# Patient Record
Sex: Male | Born: 1960 | Race: Black or African American | Hispanic: No | Marital: Single | Smoking: Current every day smoker
Health system: Southern US, Community
[De-identification: ages and names within clinical notes are randomized; demographics above are authoritative.]

## PROBLEM LIST (undated history)

## (undated) ENCOUNTER — Emergency Department: Admission: EM | Disposition: A | Discharge status: Other Acute Inpatient Hospital | Payer: Self-pay

## (undated) DIAGNOSIS — K649 Unspecified hemorrhoids: Secondary | ICD-10-CM

## (undated) DIAGNOSIS — E785 Hyperlipidemia, unspecified: Secondary | ICD-10-CM

## (undated) DIAGNOSIS — I10 Essential (primary) hypertension: Secondary | ICD-10-CM

## (undated) DIAGNOSIS — N289 Disorder of kidney and ureter, unspecified: Secondary | ICD-10-CM

## (undated) DIAGNOSIS — M1990 Unspecified osteoarthritis, unspecified site: Secondary | ICD-10-CM

## (undated) HISTORY — DX: Unspecified osteoarthritis, unspecified site: M19.90

## (undated) HISTORY — PX: EYE SURGERY: SHX253

## (undated) HISTORY — DX: Unspecified hemorrhoids: K64.9

## (undated) HISTORY — DX: Hyperlipidemia, unspecified: E78.5

## (undated) HISTORY — PX: TOTAL SHOULDER REPLACEMENT: SUR1217

---

## 2005-03-05 ENCOUNTER — Emergency Department: Payer: Self-pay | Admitting: Emergency Medicine

## 2005-03-05 IMAGING — CR DG SHOULDER 3+V*L*
1 series · 3 of 3 positions shown · non-contrast
Comparison: none

REASON FOR EXAM: Possible dislocated shoulder
COMMENTS:

PROCEDURE:     DXR - DXR SHOULDER LEFT COMPLETE  - March 05, 2005  [DATE]
RESULT:     The patient sustained inferior dislocation of the shoulder.
Some fragmentation of the greater tuberosity is suspected.

[Series 1: view not recorded · 0.17mm/px · 3 of 3 slices shown]
[im 1/3]
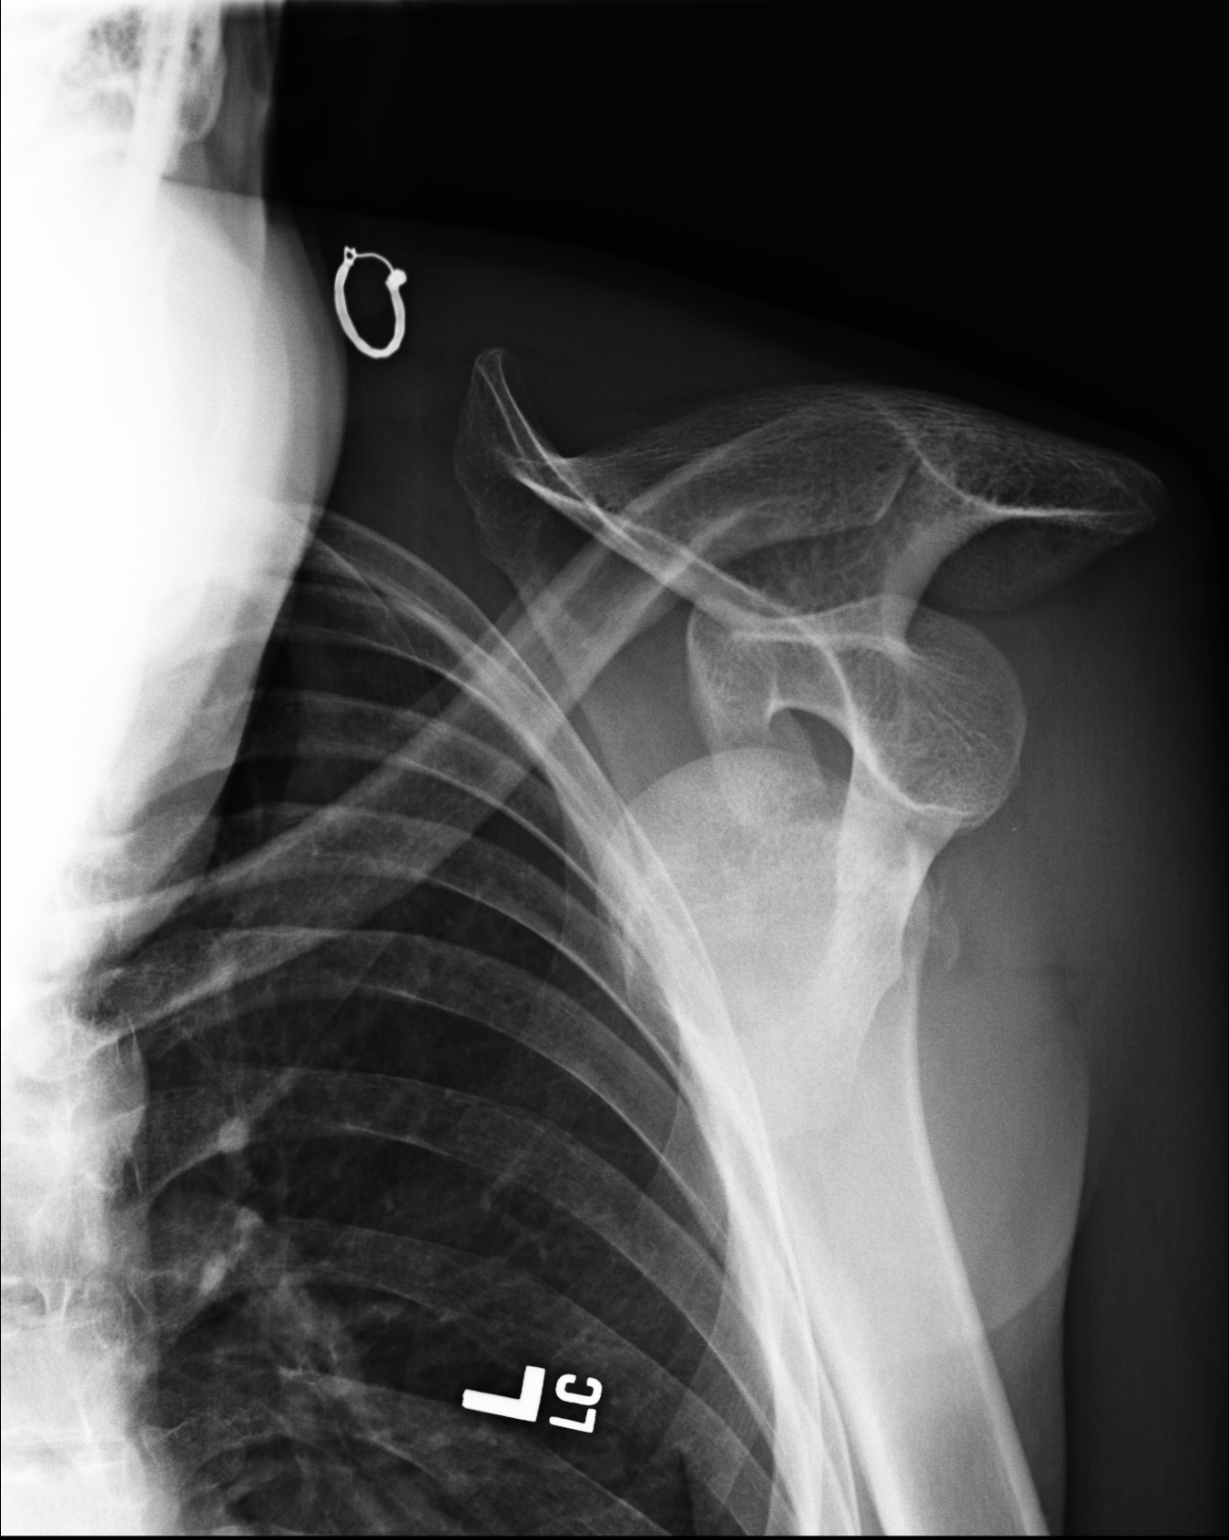
[im 2/3]
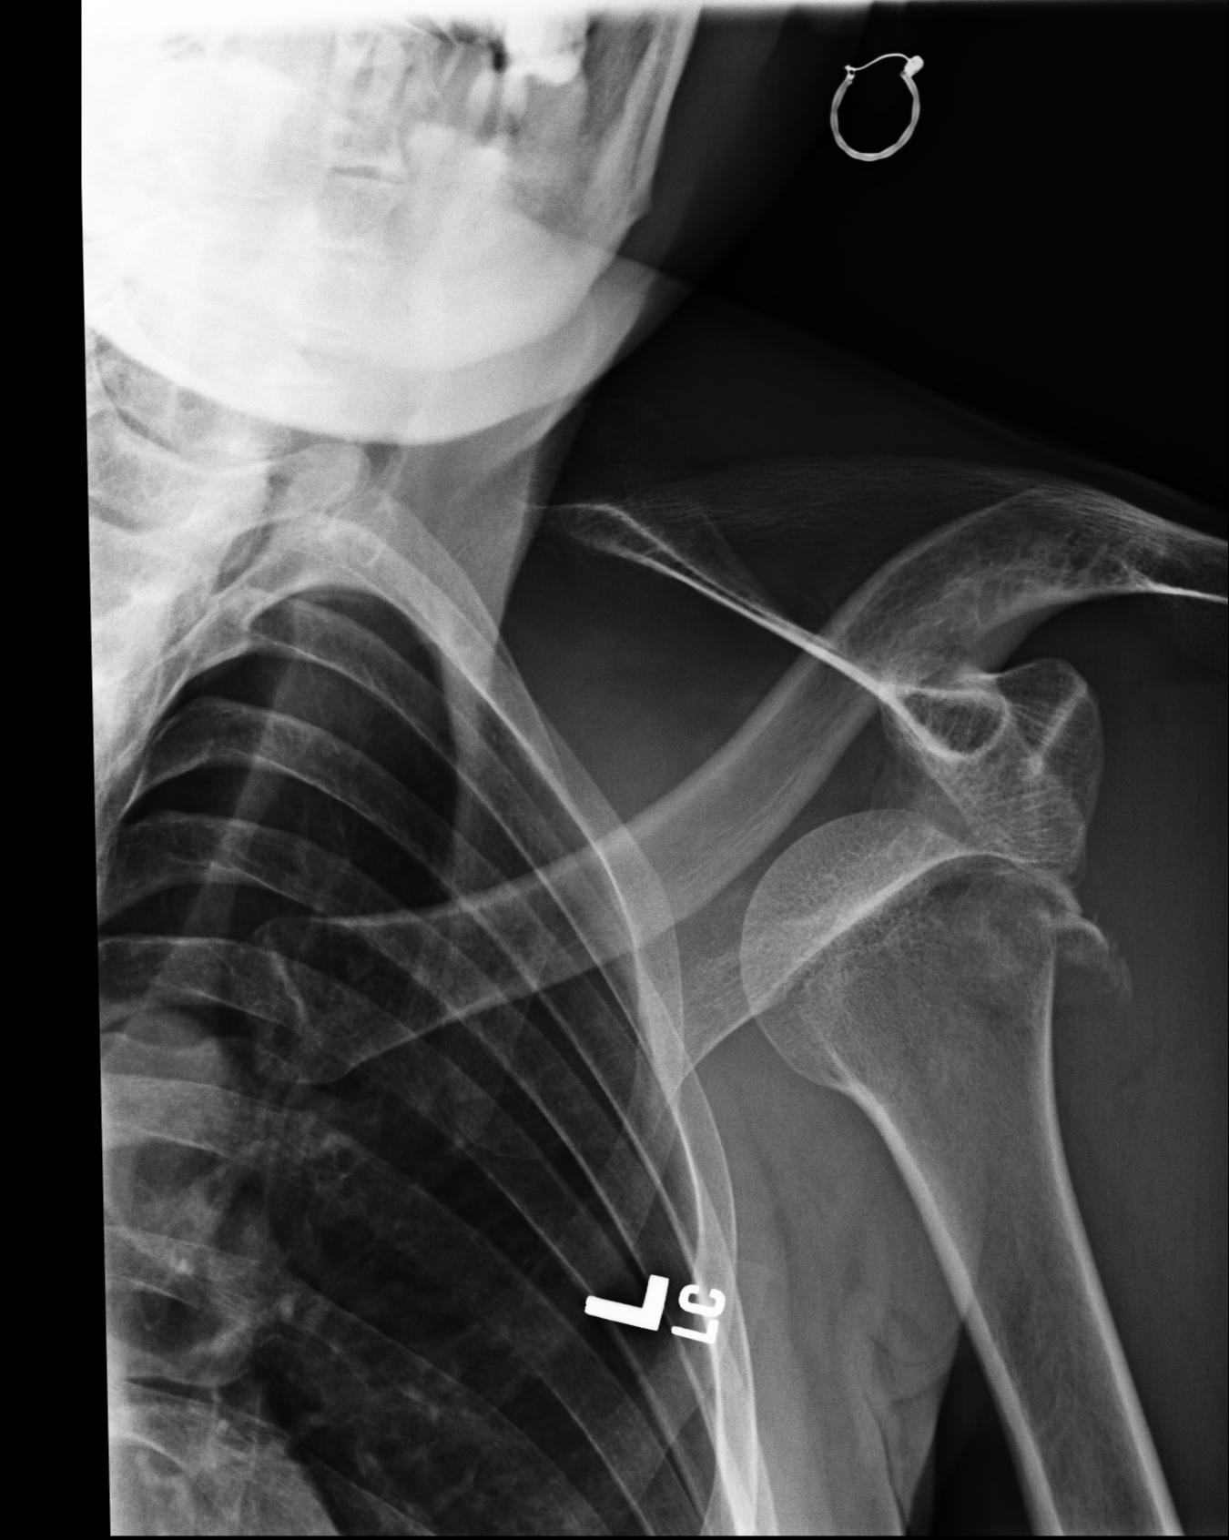
[im 3/3]
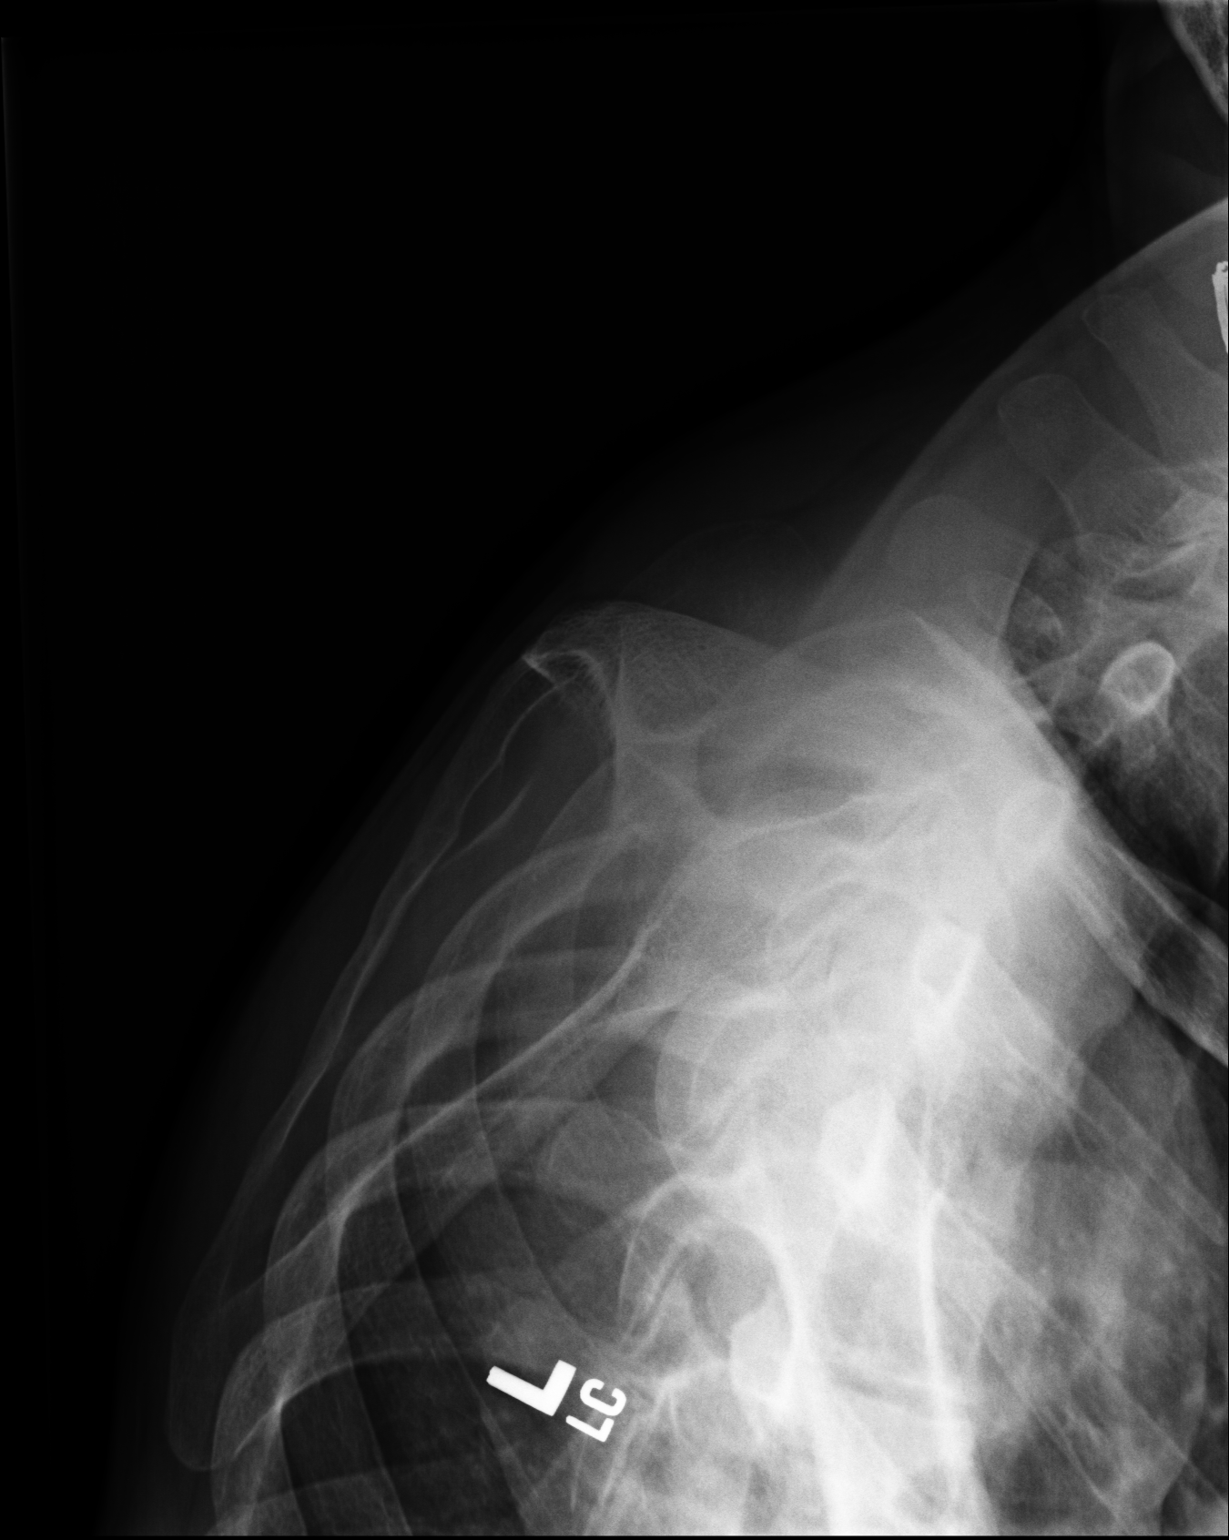

[3 of 3 positions shown; findings below may reference images not displayed]

IMPRESSION: The patient has sustained an inferior and presumably anterior dislocation of
the humeral head and there does appear to be some avulsion from the greater
tuberosity.

## 2005-03-05 IMAGING — CR DG SHOULDER 3+V*L*
1 series · 2 of 2 positions shown · non-contrast
Comparison: none

REASON FOR EXAM: Post reduction
COMMENTS:

[Series 1: view not recorded · 0.17mm/px · 2 of 2 slices shown]
[im 1/2]
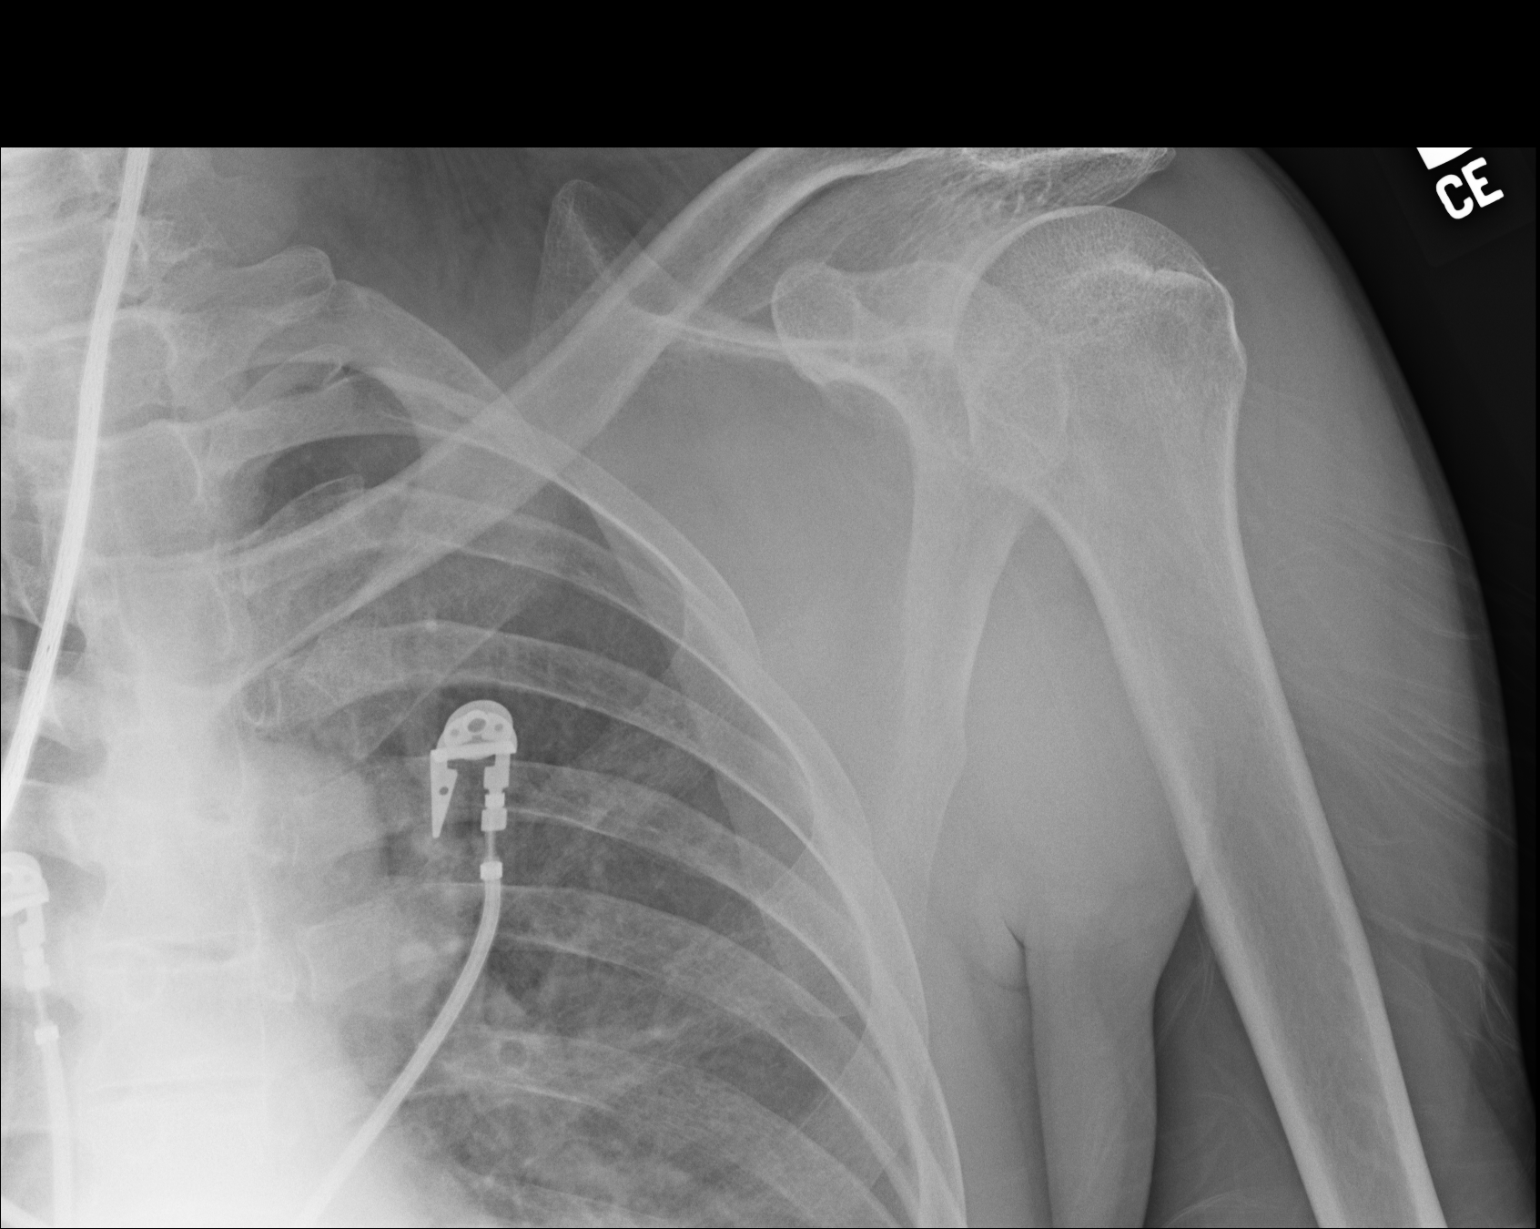
[im 2/2]
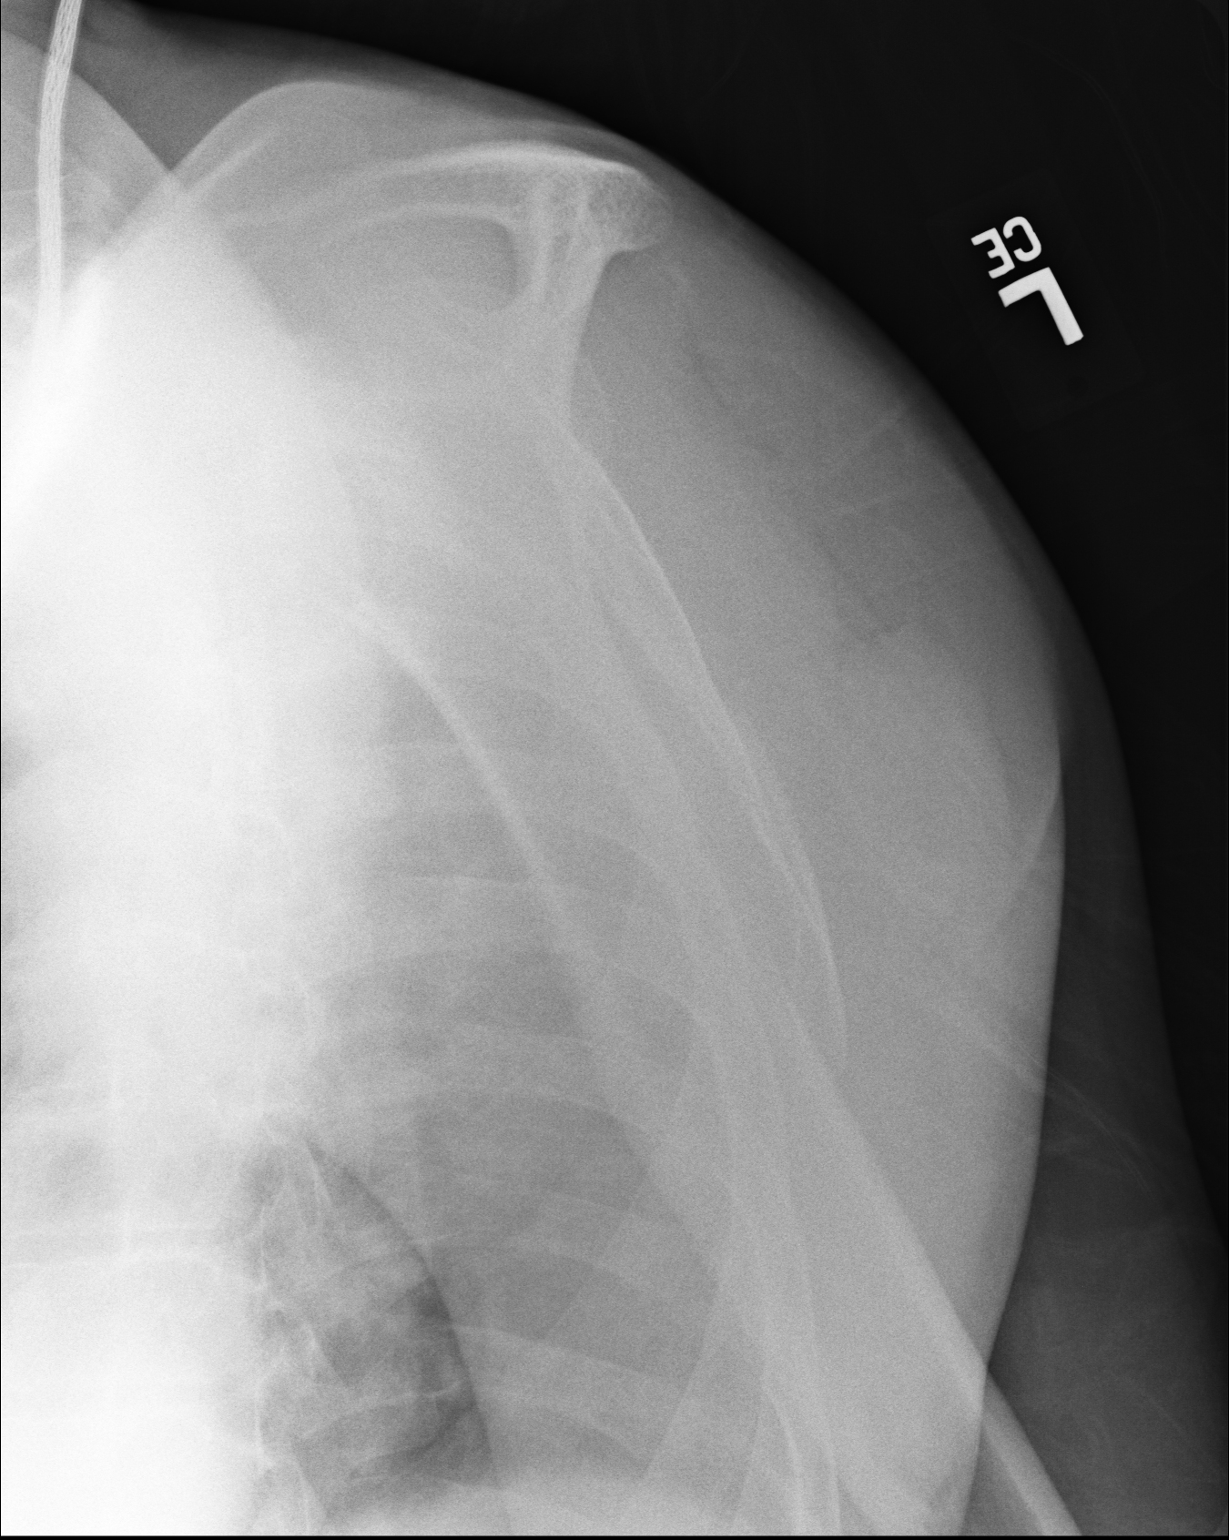

[2 of 2 positions shown; findings below may reference images not displayed]

PROCEDURE:     DXR - DXR SHOULDER LEFT COMPLETE  - March 05, 2005  [DATE]

RESULT:          The patient has undergone successful relocation of the
shoulder.  The fragmentation seen in the region of the greater tuberosity is
not visible now and may be overlapping the bony structures.  Please refer to
the pre-reduction films for additional details of the fragmentation.
IMPRESSION: The patient has undergone successful relocation of the
previously dislocated LEFT humeral head.  On the pre-reduction films, there
were findings that suggested avulsion from the greater tuberosity.  CT
scanning of the shoulder may be indicated.

## 2009-06-12 DIAGNOSIS — R809 Proteinuria, unspecified: Secondary | ICD-10-CM | POA: Insufficient documentation

## 2010-08-19 DIAGNOSIS — I10 Essential (primary) hypertension: Secondary | ICD-10-CM | POA: Insufficient documentation

## 2011-03-30 ENCOUNTER — Emergency Department: Payer: Self-pay | Admitting: *Deleted

## 2011-03-30 LAB — COMPREHENSIVE METABOLIC PANEL
Alkaline Phosphatase: 51 U/L (ref 50–136)
Bilirubin,Total: 0.5 mg/dL (ref 0.2–1.0)
Calcium, Total: 9.5 mg/dL (ref 8.5–10.1)
Chloride: 106 mmol/L (ref 98–107)
Co2: 26 mmol/L (ref 21–32)
Creatinine: 1.02 mg/dL (ref 0.60–1.30)
EGFR (African American): >60
EGFR (Non-African Amer.): >60
Osmolality: 282 (ref 275–301)
SGOT(AST): 54 U/L — ABNORMAL HIGH (ref 15–37)
Sodium: 142 mmol/L (ref 136–145)

## 2011-03-30 LAB — CK TOTAL AND CKMB (NOT AT ARMC)
CK, Total: 404 U/L — ABNORMAL HIGH (ref 35–232)
CK-MB: 0.6 ng/mL (ref 0.5–3.6)

## 2011-03-30 LAB — CBC
HCT: 41.8 % (ref 40.0–52.0)
MCHC: 33.1 g/dL (ref 32.0–36.0)
Platelet: 159 10*3/uL (ref 150–440)
RBC: 4.32 10*6/uL — ABNORMAL LOW (ref 4.40–5.90)
RDW: 14.8 % — ABNORMAL HIGH (ref 11.5–14.5)

## 2011-03-30 LAB — TROPONIN I: Troponin-I: <0.02 ng/mL

## 2011-03-30 IMAGING — CR DG CHEST 1V PORT
1 series · 1 of 1 positions shown · non-contrast
Comparison: none

REASON FOR EXAM: pain
COMMENTS:

PROCEDURE:     DXR - DXR PORTABLE CHEST SINGLE VIEW  - March 30, 2011  [DATE]
RESULT:     Comparison: None

[ap]
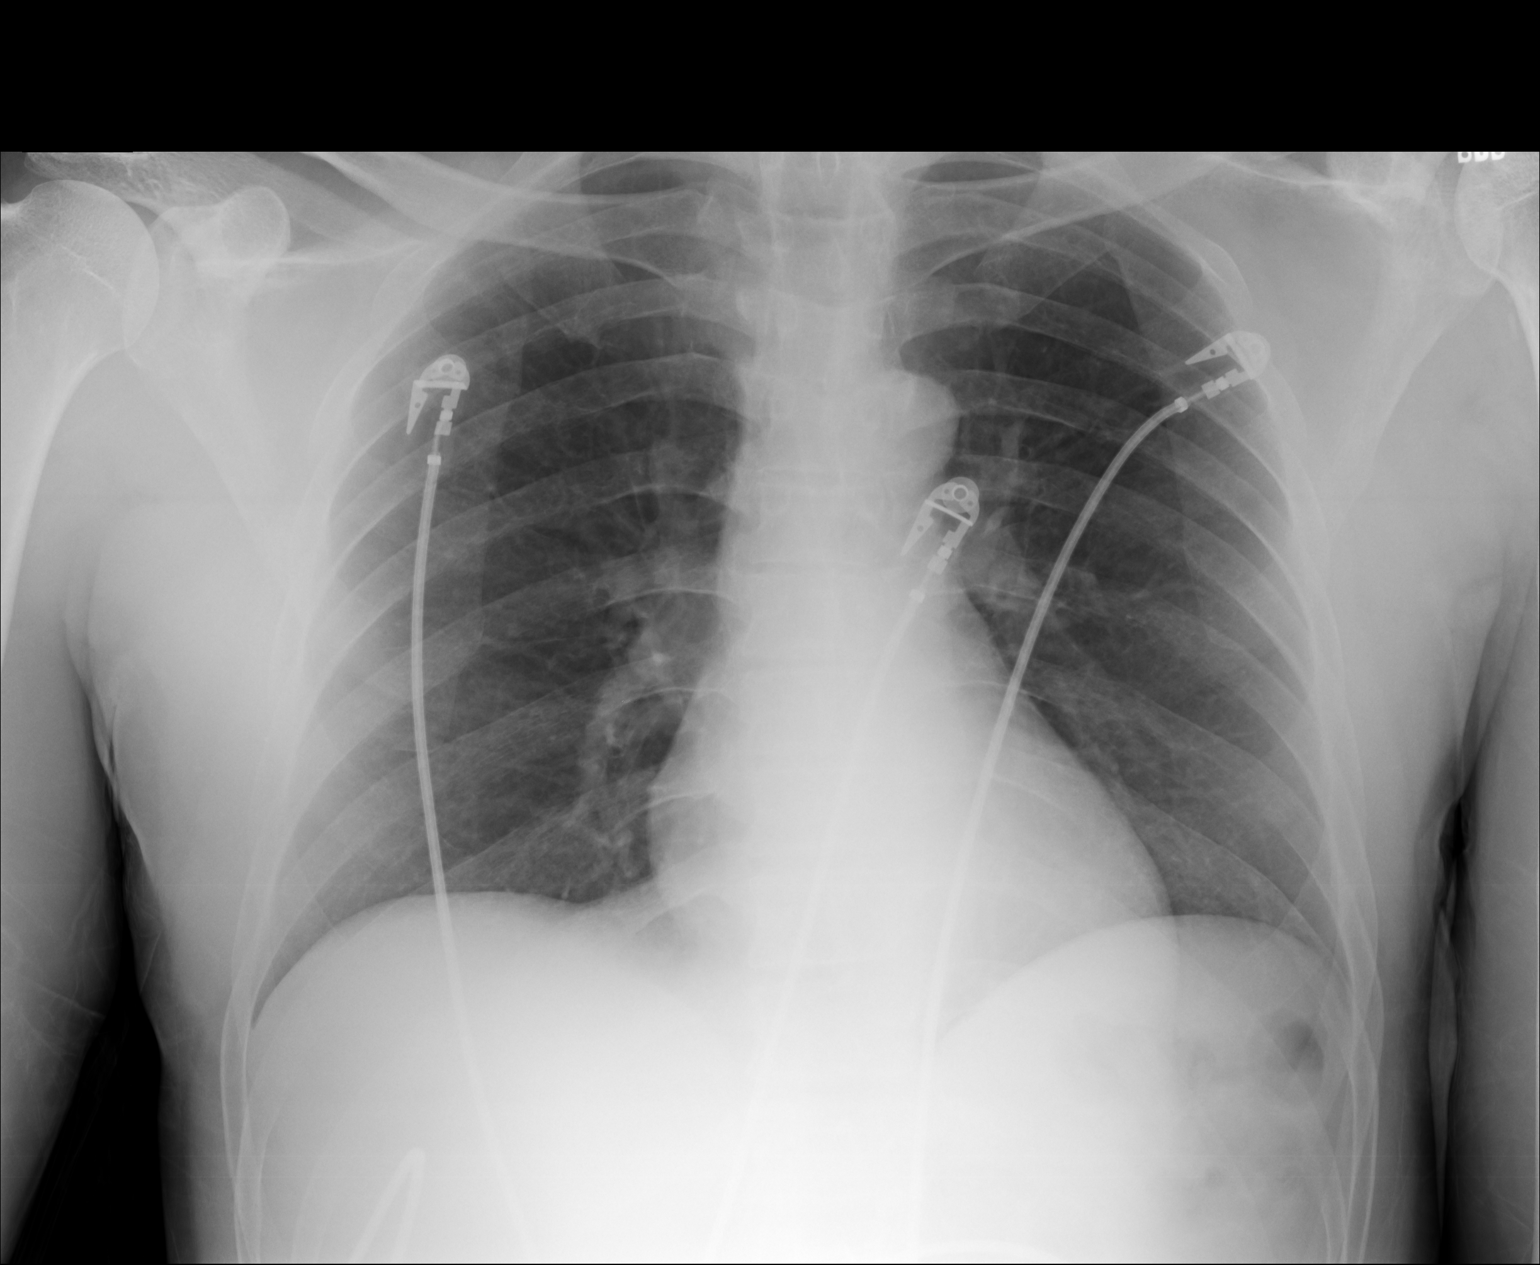

[1 of 1 positions shown; findings below may reference images not displayed]

FINDINGS: Single portable AP chest radiograph is provided.  There is no focal
parenchymal opacity, pleural effusion, or pneumothorax. Normal
cardiomediastinal silhouette. The osseous structures are unremarkable.
IMPRESSION: No acute disease of the chest.

## 2012-04-18 DIAGNOSIS — E7800 Pure hypercholesterolemia, unspecified: Secondary | ICD-10-CM | POA: Insufficient documentation

## 2012-08-02 DIAGNOSIS — R7401 Elevation of levels of liver transaminase levels: Secondary | ICD-10-CM | POA: Insufficient documentation

## 2013-07-19 ENCOUNTER — Emergency Department: Payer: Self-pay | Admitting: Emergency Medicine
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
## 2013-07-26 DIAGNOSIS — S63509A Unspecified sprain of unspecified wrist, initial encounter: Secondary | ICD-10-CM | POA: Insufficient documentation

## 2017-05-07 ENCOUNTER — Other Ambulatory Visit: Payer: Self-pay | Admitting: Family Medicine

## 2017-05-07 ENCOUNTER — Ambulatory Visit
Admission: RE | Admit: 2017-05-07 | Discharge: 2017-05-07 | Disposition: A | Discharge status: Home / Self Care | Payer: Disability Insurance | Source: Ambulatory Visit | Attending: Family Medicine | Admitting: Family Medicine

## 2017-05-07 DIAGNOSIS — M47816 Spondylosis without myelopathy or radiculopathy, lumbar region: Secondary | ICD-10-CM | POA: Diagnosis not present

## 2017-05-07 DIAGNOSIS — R2681 Unsteadiness on feet: Secondary | ICD-10-CM

## 2017-05-07 IMAGING — CR DG LUMBAR SPINE 2-3V
1 series · 3 of 3 positions shown · non-contrast
Comparison: None.

CLINICAL DATA: 57 y/o M; lower back pain for 2 years. No prior
injury.

EXAM:
LUMBAR SPINE - 2-3 VIEW

[Series 1: dg lumbar spine 2-3 views · 0.14mm/px · 3 of 3 slices shown]
[im 1/3]
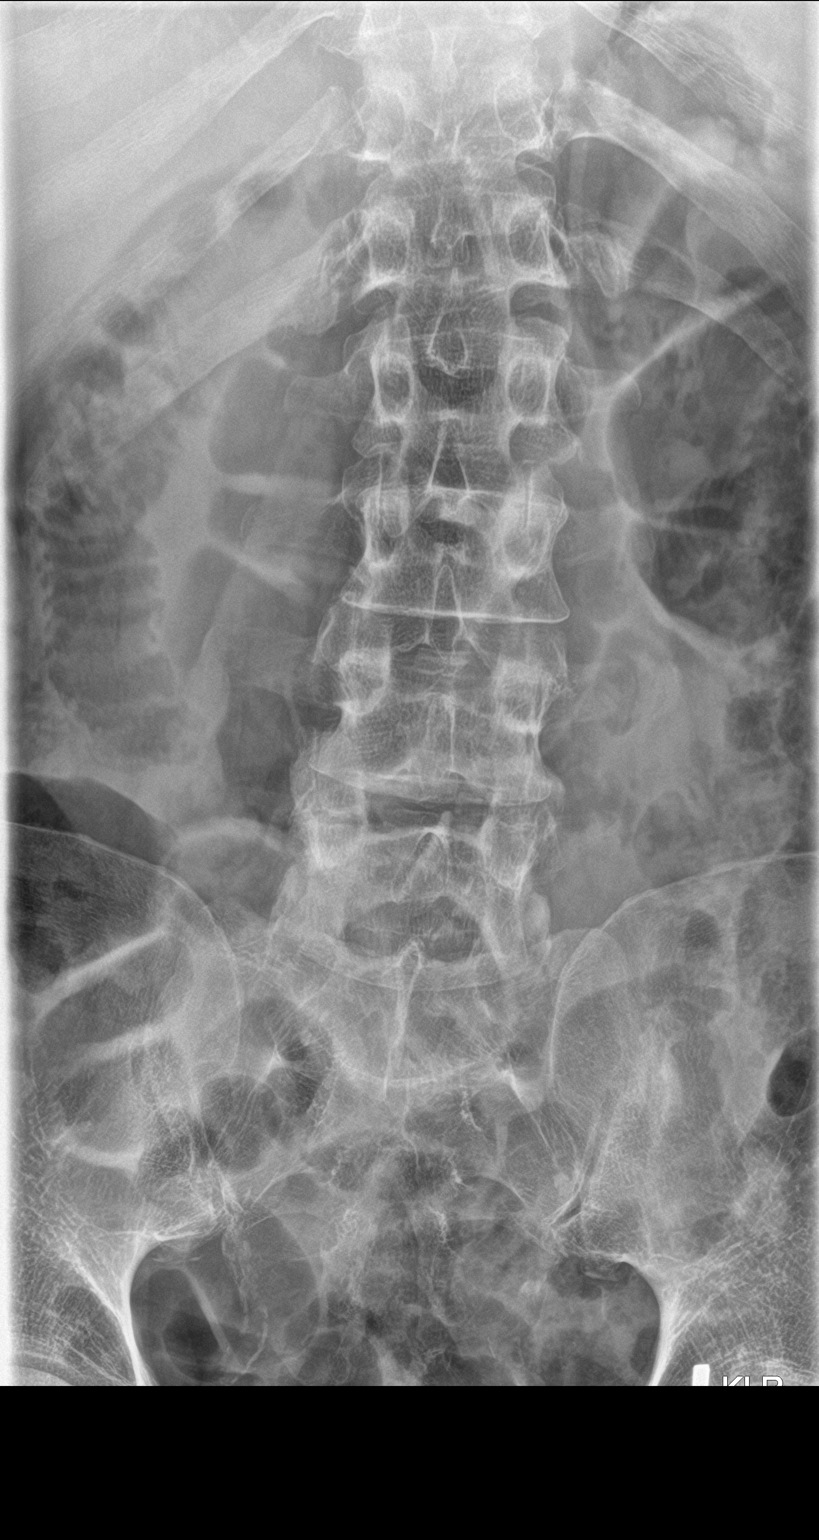
[im 2/3]
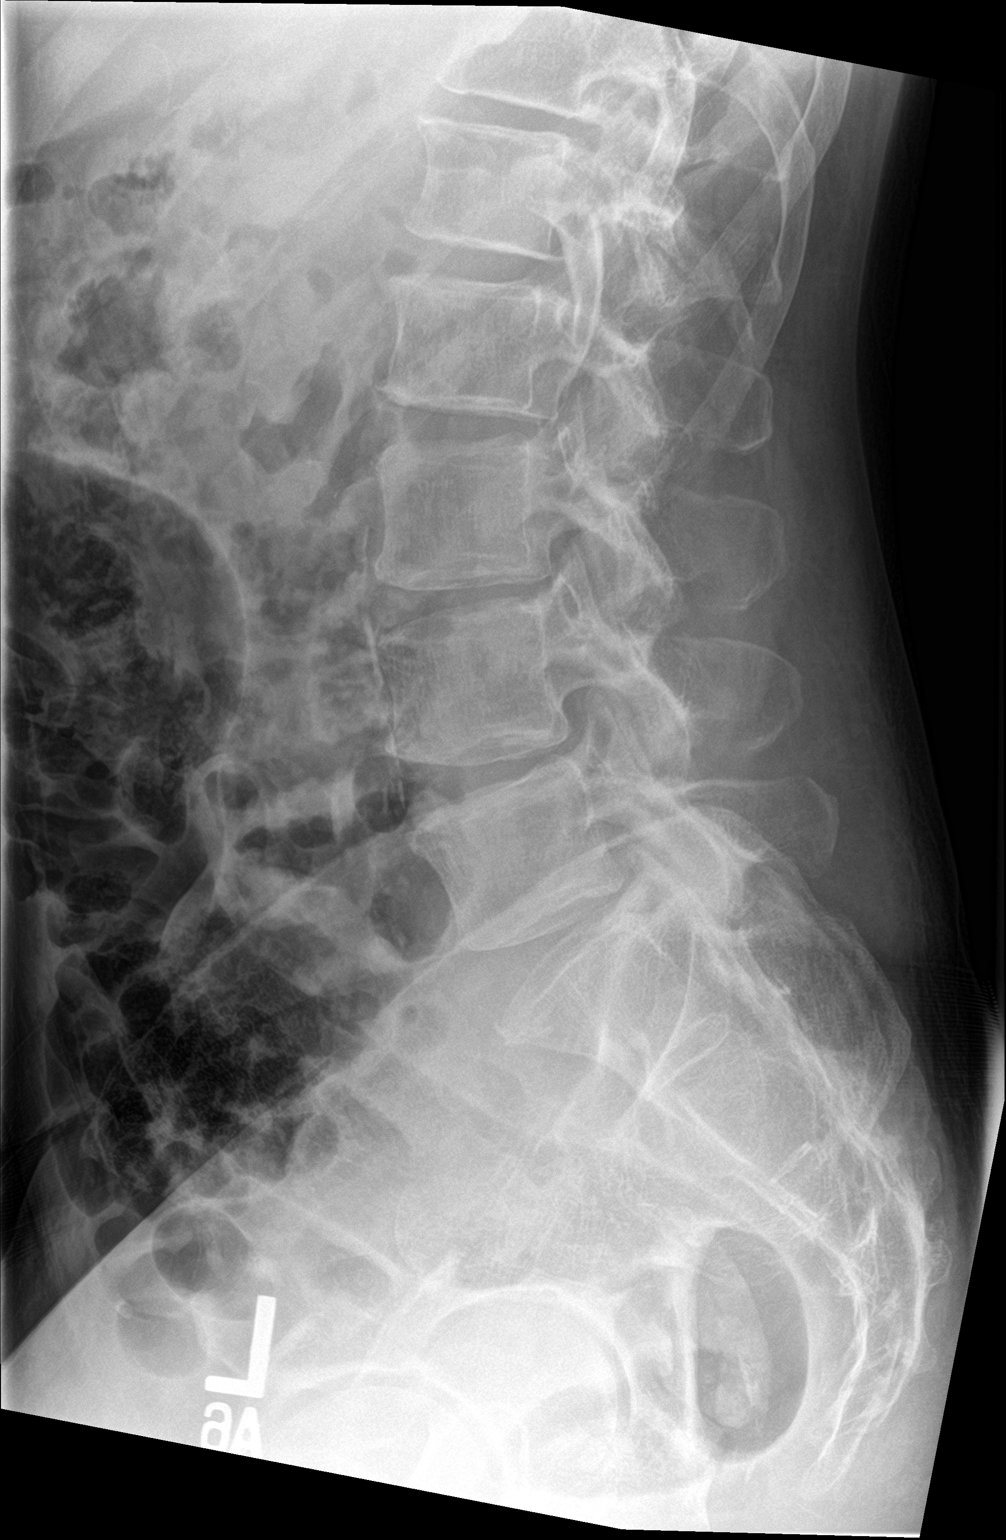
[im 3/3]
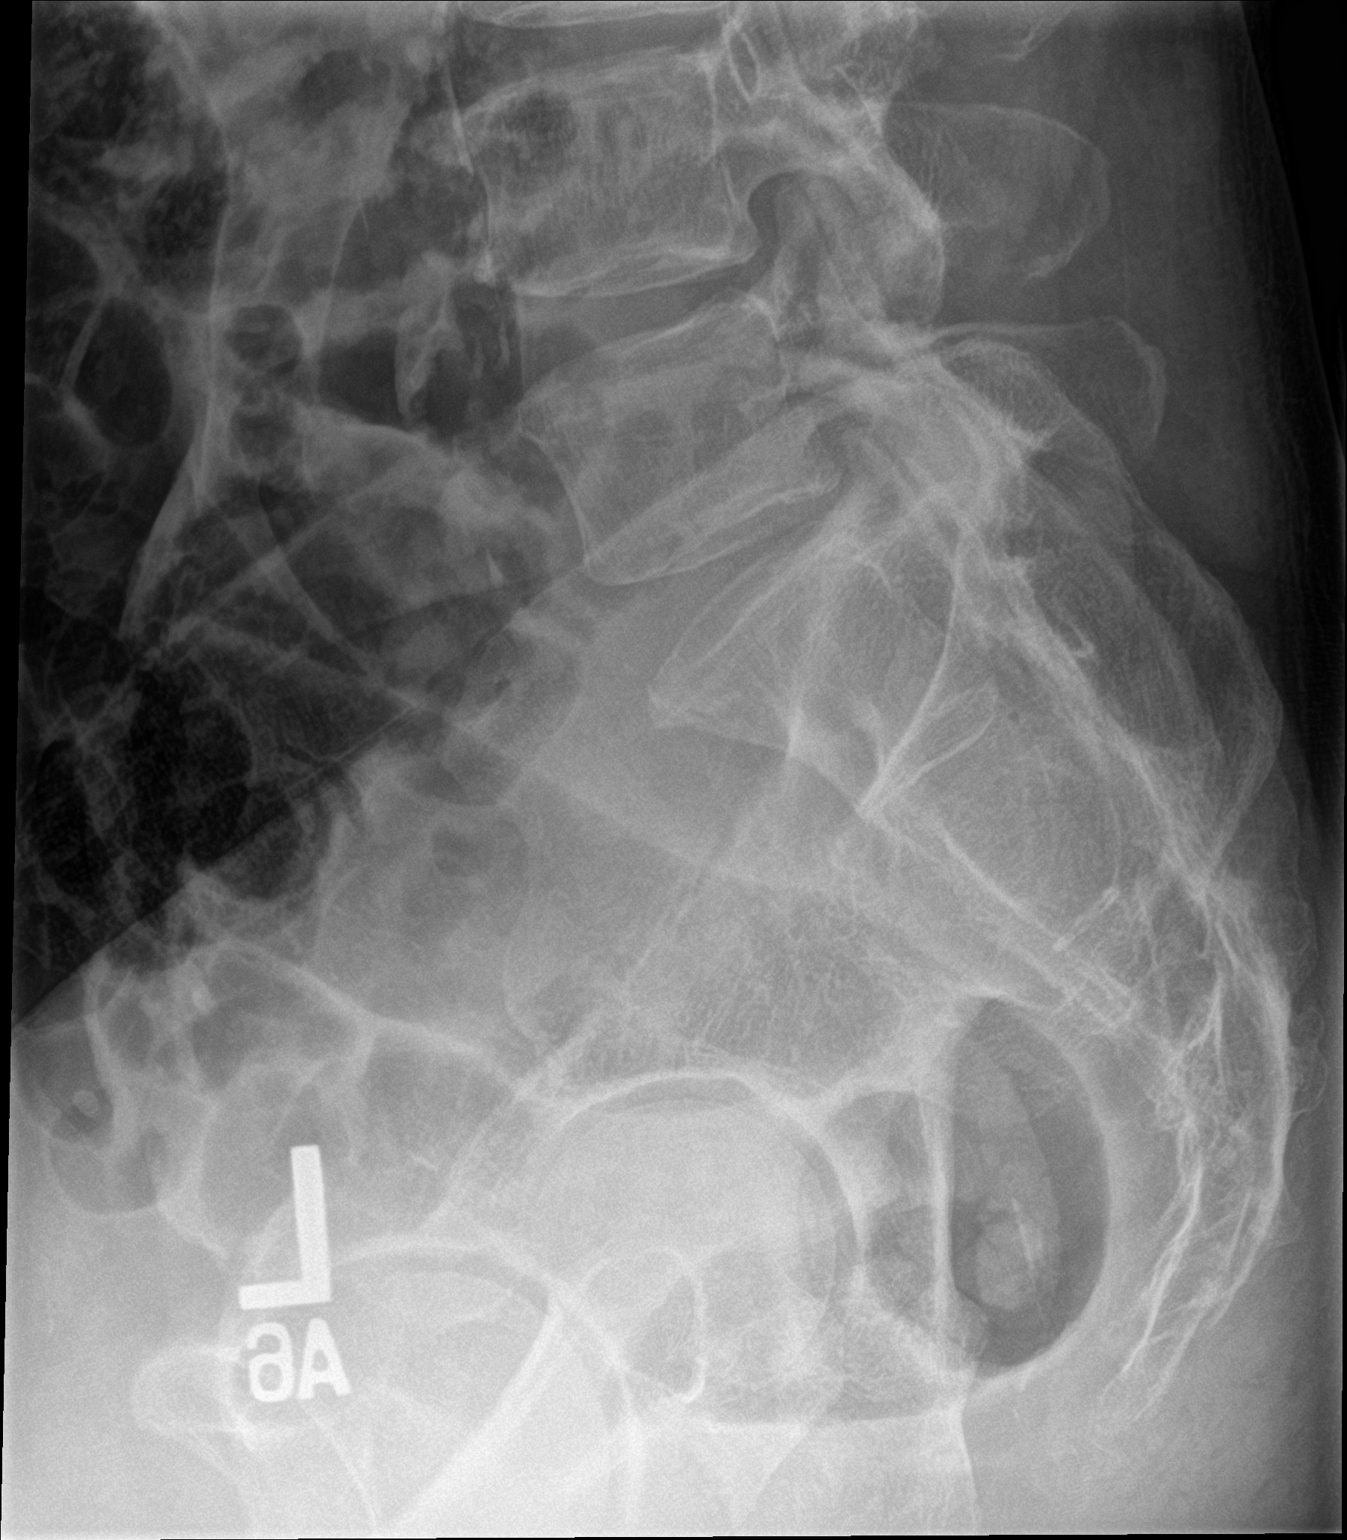

[3 of 3 positions shown; findings below may reference images not displayed]

FINDINGS: Four lumbar type non-rib-bearing vertebral bodies. Mild
levocurvature with apex at L1. Normal lumbar lordosis without
listhesis. Vertebral body and disc space heights are maintained.
Mild lower lumbar facet arthrosis. Abdominal aortic calcific
atherosclerosis.
IMPRESSION: Mild lumbar spondylosis with lower lumbar facet arthrosis and
levocurvature. No significant loss of vertebral body or disc space
height.

By: Zilton Adilson M.D.

## 2018-01-07 ENCOUNTER — Emergency Department
Admission: EM | Admit: 2018-01-07 | Discharge: 2018-01-07 | Disposition: A | Discharge status: Home / Self Care | Payer: Medicaid Other | Attending: Emergency Medicine | Admitting: Emergency Medicine

## 2018-01-07 ENCOUNTER — Encounter: Payer: Self-pay | Admitting: Emergency Medicine

## 2018-01-07 ENCOUNTER — Other Ambulatory Visit: Payer: Self-pay

## 2018-01-07 ENCOUNTER — Emergency Department: Payer: Medicaid Other

## 2018-01-07 DIAGNOSIS — R1030 Lower abdominal pain, unspecified: Secondary | ICD-10-CM | POA: Diagnosis present

## 2018-01-07 DIAGNOSIS — I10 Essential (primary) hypertension: Secondary | ICD-10-CM | POA: Diagnosis not present

## 2018-01-07 DIAGNOSIS — F17210 Nicotine dependence, cigarettes, uncomplicated: Secondary | ICD-10-CM | POA: Diagnosis not present

## 2018-01-07 DIAGNOSIS — K852 Alcohol induced acute pancreatitis without necrosis or infection: Secondary | ICD-10-CM | POA: Diagnosis not present

## 2018-01-07 HISTORY — DX: Essential (primary) hypertension: I10

## 2018-01-07 HISTORY — DX: Disorder of kidney and ureter, unspecified: N28.9

## 2018-01-07 LAB — URINALYSIS, COMPLETE (UACMP) WITH MICROSCOPIC
Bacteria, UA: NONE SEEN
Bilirubin Urine: NEGATIVE
GLUCOSE, UA: NEGATIVE mg/dL
Hgb urine dipstick: NEGATIVE
Ketones, ur: 20 mg/dL — AB
Leukocytes, UA: NEGATIVE
Nitrite: NEGATIVE
PH: 7 (ref 5.0–8.0)
Protein, ur: >=300 mg/dL — AB
SPECIFIC GRAVITY, URINE: 1.026 (ref 1.005–1.030)

## 2018-01-07 LAB — CBC
HCT: 41.2 % (ref 39.0–52.0)
Hemoglobin: 14.1 g/dL (ref 13.0–17.0)
MCH: 32.1 pg (ref 26.0–34.0)
MCHC: 34.2 g/dL (ref 30.0–36.0)
MCV: 93.8 fL (ref 80.0–100.0)
NRBC: 0 % (ref 0.0–0.2)
PLATELETS: 146 10*3/uL — AB (ref 150–400)
RBC: 4.39 MIL/uL (ref 4.22–5.81)
RDW: 14.4 % (ref 11.5–15.5)
WBC: 8.5 10*3/uL (ref 4.0–10.5)

## 2018-01-07 LAB — COMPREHENSIVE METABOLIC PANEL
ALT: 78 U/L — ABNORMAL HIGH (ref 0–44)
AST: 84 U/L — ABNORMAL HIGH (ref 15–41)
Albumin: 3.8 g/dL (ref 3.5–5.0)
Alkaline Phosphatase: 98 U/L (ref 38–126)
Anion gap: 11 (ref 5–15)
BILIRUBIN TOTAL: 1 mg/dL (ref 0.3–1.2)
BUN: 7 mg/dL (ref 6–20)
CO2: 25 mmol/L (ref 22–32)
Calcium: 9 mg/dL (ref 8.9–10.3)
Chloride: 94 mmol/L — ABNORMAL LOW (ref 98–111)
Creatinine, Ser: 0.91 mg/dL (ref 0.61–1.24)
GFR calc non Af Amer: >60 mL/min (ref >60)
Glucose, Bld: 117 mg/dL — ABNORMAL HIGH (ref 70–99)
Potassium: 3.8 mmol/L (ref 3.5–5.1)
Sodium: 130 mmol/L — ABNORMAL LOW (ref 135–145)
TOTAL PROTEIN: 8.5 g/dL — AB (ref 6.5–8.1)

## 2018-01-07 LAB — LIPASE, BLOOD: Lipase: 559 U/L — ABNORMAL HIGH (ref 11–51)

## 2018-01-07 IMAGING — CT CT ABD-PELV W/ CM
2 of 5 series · 15 of 46 positions shown, 17 images · IV contrast (APPLIED)
Comparison: None.

CLINICAL DATA: 57-year-old male with lower abdominal pain x2 days.
Lower back pain and diarrhea. Elevated lipase and liver enzymes.

EXAM:
CT ABDOMEN AND PELVIS WITH CONTRAST
TECHNIQUE: Multidetector CT imaging of the abdomen and pelvis was performed
using the standard protocol following bolus administration of
intravenous contrast.
CONTRAST:  100mL 783MSP-SII IOPAMIDOL (783MSP-SII) INJECTION 61%

[Series 2: routine abd/pel with · axial · 0.75mm/px · z∈[-472,-38]mm · 12 of 97 slices shown, 14 images]
[im 5/97  soft-tissue]
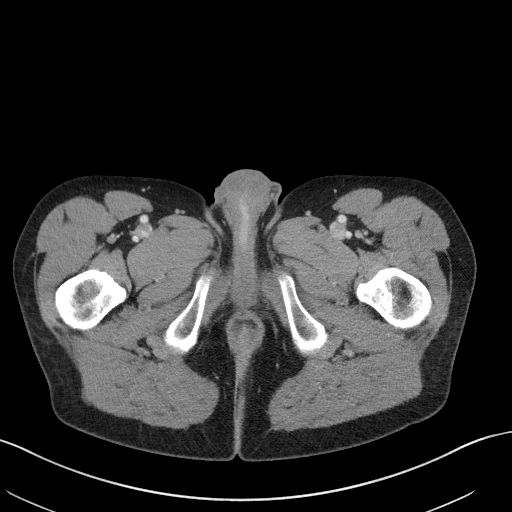
[im 5/97  bone]
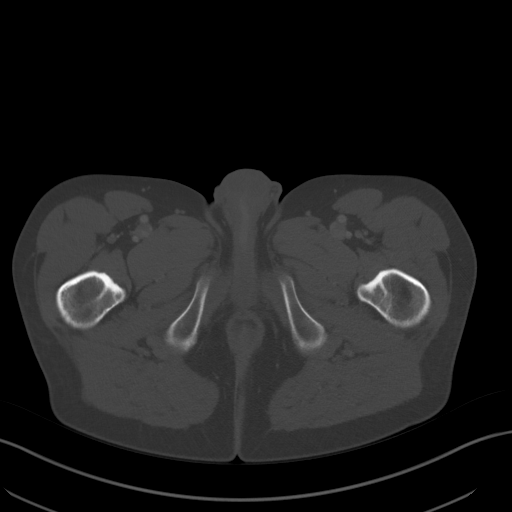
[im 15/97  soft-tissue]
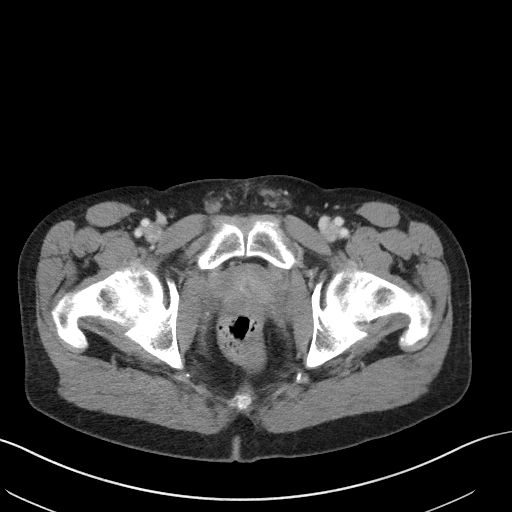
[im 20/97  soft-tissue]
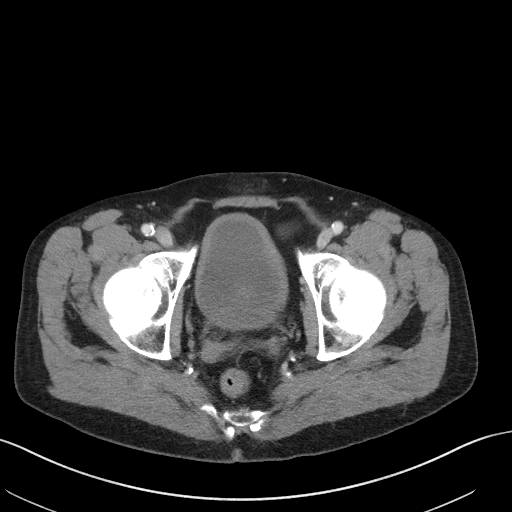
[im 29/97  soft-tissue]
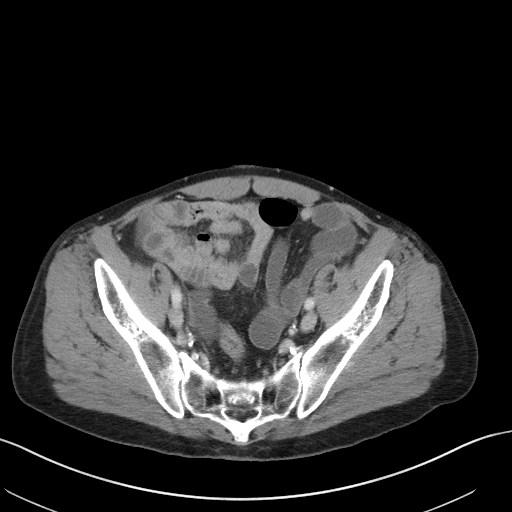
[im 39/97  soft-tissue]
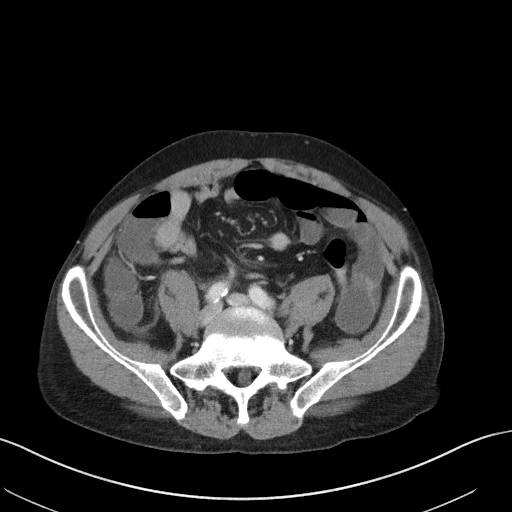
[im 44/97  soft-tissue]
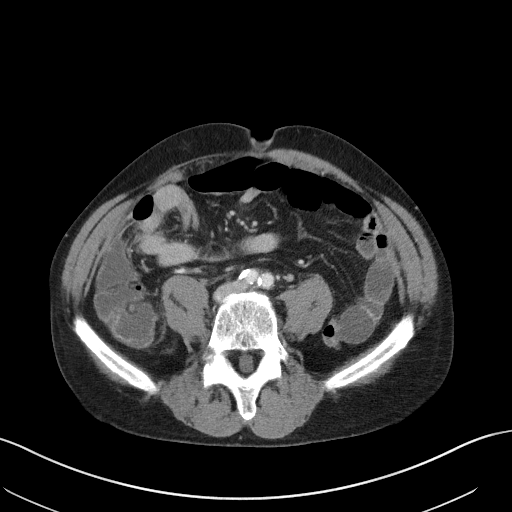
[im 53/97  soft-tissue]
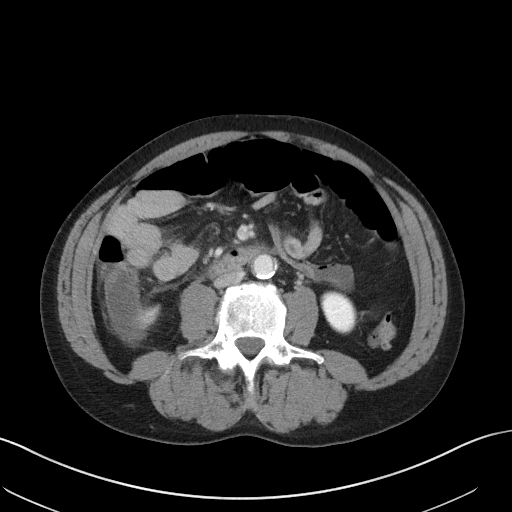
[im 58/97  soft-tissue]
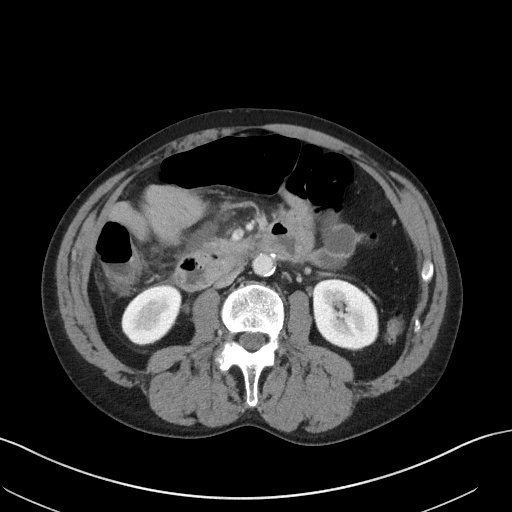
[im 68/97  soft-tissue]
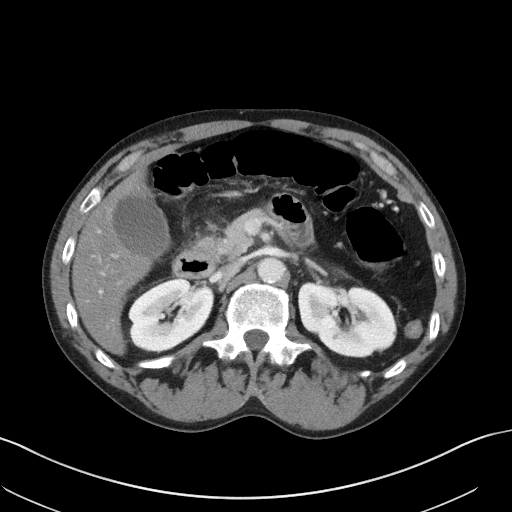
[im 68/97  bone]
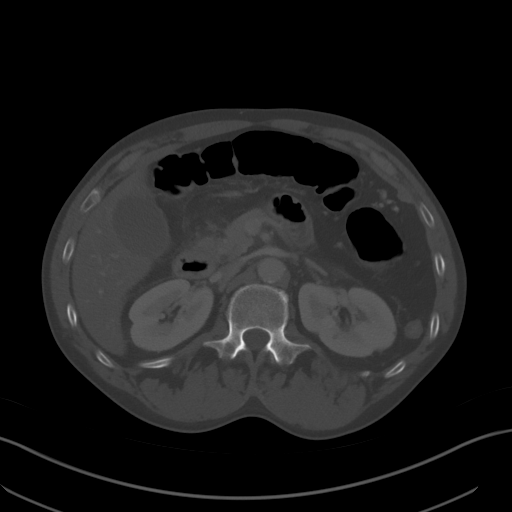
[im 77/97  soft-tissue]
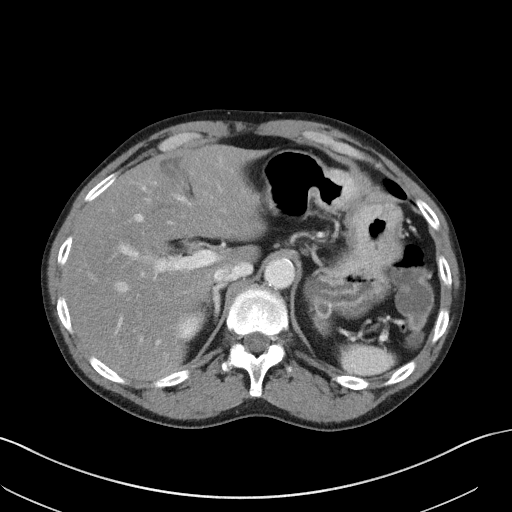
[im 82/97  soft-tissue]
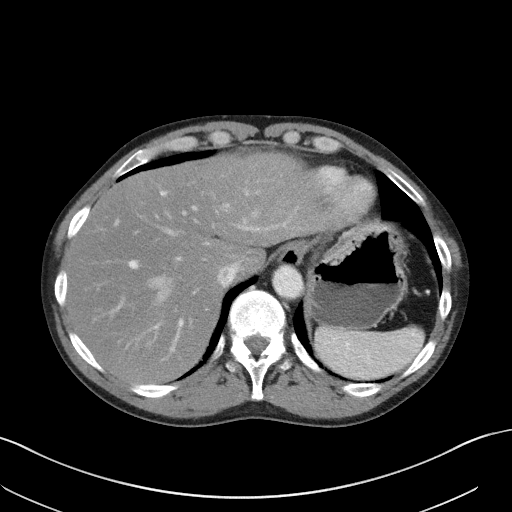
[im 92/97  soft-tissue]
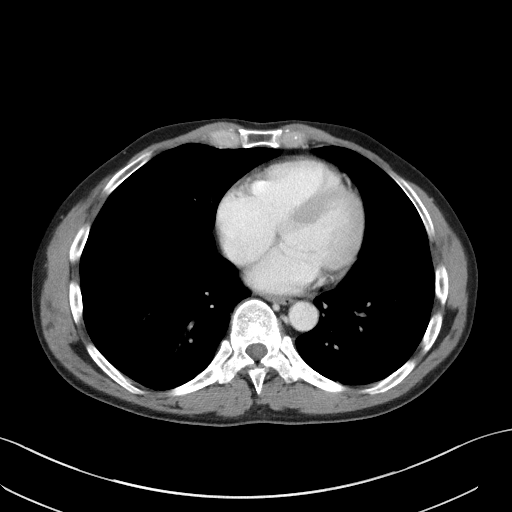

[Series 5: coronal st · coronal · 0.70mm/px · 3 of 85 slices shown]
[im 29/85  soft-tissue]
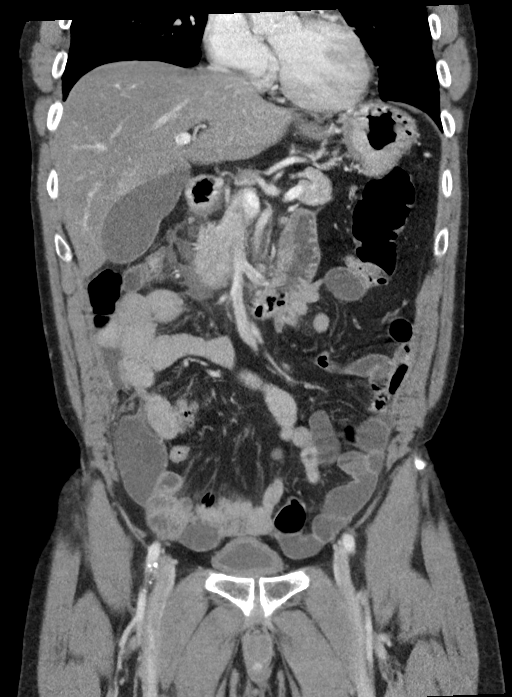
[im 38/85  soft-tissue]
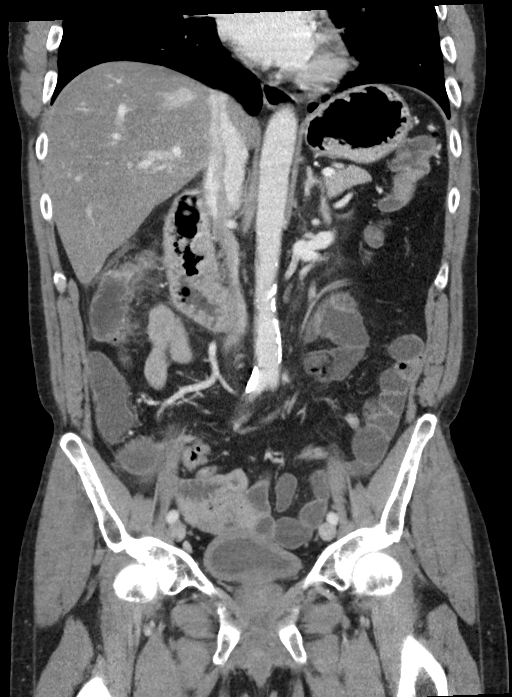
[im 47/85  soft-tissue]
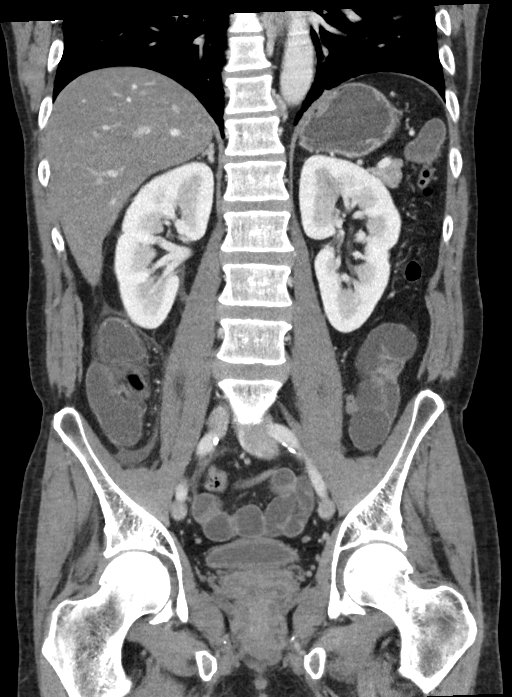

[15 of 46 positions shown; findings below may reference images not displayed]

FINDINGS: Lower chest: Minimal bibasilar atelectatic changes. The visualized
lung bases are otherwise clear.

No intra-abdominal free air. Small amount of free fluid in the right
upper abdomen and adjacent to the head of the pancreas and second
portion of the duodenum.

Hepatobiliary: There is diffuse fatty infiltration of the liver with
areas of fat sparing adjacent to the gallbladder. There is a 16 x 17
mm hypodense area adjacent to the falciform ligament which is not
well characterized but likely a focal area of more prominent fat
deposit. There is no intrahepatic biliary ductal dilatation.3 the
gallbladder is distended0 and appears unremarkable.

Pancreas: There is stranding adjacent to the head and uncinate
process of the pancreas most consistent with acute pancreatitis in
the setting of elevated lipase levels. Clinical correlation is
recommended. No drainable fluid collection/abscess or pseudocyst.

Spleen: Normal in size without focal abnormality.

Adrenals/Urinary Tract: The adrenal glands, kidneys, and the
visualized ureters appear unremarkable. There is apparent diffuse
thickening of the bladder wall which may be partly related to
underdistention. Cystitis is not excluded. Correlation with
urinalysis recommended.

Stomach/Bowel: There is colonic diverticulosis without active
inflammatory changes. The sigmoid colon is redundant.. There is an
area of mucosal enhancement involving the ascending colon at the
hepatic flexure which may be reactive to inflammatory changes of the
pancreas. Colitis is less likely. There is loose stool throughout
the colon compatible with diarrheal state. No evidence of bowel
obstruction. Normal appendix.

Vascular/Lymphatic: Moderate aortoiliac atherosclerotic disease. The
origins of the celiac axis, SMA, IMA are patent. The SMV, splenic
vein, and main portal vein are patent. No portal venous gas. There
is no adenopathy.

Reproductive: The prostate gland is enlarged measuring approximately
6 cm in diameter. The seminal vesicles are symmetric. No pelvic
mass.

Other: Small fat containing umbilical hernia.

Musculoskeletal: No acute or significant osseous findings.
IMPRESSION: 1. Acute pancreatitis.  No abscess or pseudocyst.
2. Diarrheal state. Correlation with clinical exam and stool
cultures recommended.
3. Mucosal enhancement of the hepatic flexure of the colon, likely
reactive to inflammatory changes of the pancreas. No bowel
obstruction. Normal appendix.
4. Fatty liver.
5. Colonic diverticulosis.
6.  Aortic Atherosclerosis ([id number]).

## 2018-01-07 MED ORDER — OXYCODONE-ACETAMINOPHEN 5-325 MG PO TABS
2.0000 | ORAL_TABLET | Freq: Once | ORAL | Status: DC
  Filled 2018-01-07: qty 2

## 2018-01-07 MED ORDER — CHLORDIAZEPOXIDE HCL 25 MG PO CAPS
25.0000 mg | ORAL_CAPSULE | Freq: Once | ORAL | Status: AC
  Administered 2018-01-07: 25 mg via ORAL

## 2018-01-07 MED ORDER — CHLORDIAZEPOXIDE HCL 25 MG PO CAPS
ORAL_CAPSULE | ORAL | Status: AC
  Filled 2018-01-07: qty 1

## 2018-01-07 MED ORDER — OXYCODONE HCL 5 MG PO TABS: 5.0000 mg | ORAL_TABLET | Freq: Three times a day (TID) | ORAL | 0 refills | Status: DC | PRN

## 2018-01-07 MED ORDER — MORPHINE SULFATE (PF) 4 MG/ML IV SOLN
4.0000 mg | Freq: Once | INTRAVENOUS | Status: AC
  Administered 2018-01-07: 4 mg via INTRAVENOUS
  Filled 2018-01-07: qty 1

## 2018-01-07 MED ORDER — ONDANSETRON 4 MG PO TBDP: 4.0000 mg | ORAL_TABLET | Freq: Three times a day (TID) | ORAL | 0 refills | Status: DC | PRN

## 2018-01-07 MED ORDER — CHLORDIAZEPOXIDE HCL 25 MG PO CAPS: ORAL_CAPSULE | ORAL | 0 refills | Status: DC

## 2018-01-07 MED ORDER — IOPAMIDOL (ISOVUE-300) INJECTION 61%
100.0000 mL | Freq: Once | INTRAVENOUS | Status: AC | PRN
  Administered 2018-01-07: 100 mL via INTRAVENOUS
  Filled 2018-01-07: qty 100

## 2018-01-07 NOTE — ED Notes (Signed)
Bladder scan highest amount showed 10ml

## 2018-01-07 NOTE — ED Triage Notes (Signed)
Pt to ED c/o bilateral lower abd pain x2 days that radiates to bilateral lower back, diarrhea x2 today, cold chills.  Denies urinary symptoms.

## 2018-01-07 NOTE — ED Provider Notes (Signed)
[name] Emergency Department Provider Note       Time seen: ----------------------------------------- 8:28 PM on 01/07/2018 -----------------------------------------   I have reviewed the triage vital signs and the nursing notes.  HISTORY   Chief Complaint Abdominal Pain   HPI Charles Lamb is a 57 y.o. male with a history of hypertension and renal disorder who presents to the ED for abdominal pain.  Patient is having bilateral lower abdominal pain for the past 2 days that radiates into his low back.  He has had diarrhea today.  He denies any urinary symptoms.  He denies fevers, chills or other complaints.  Past Medical History:  Diagnosis Date  . Hypertension   . Renal disorder     There are no active problems to display for this patient.   History reviewed. No pertinent surgical history.  Allergies Patient has no known allergies.  Social History Social History   Tobacco Use  . Smoking status: Current Every Day Smoker    Packs/day: 0.25    Types: Cigarettes  . Smokeless tobacco: Never Used  Substance Use Topics  . Alcohol use: Yes    Comment: 1 40oz a day  . Drug use: Not on file   Review of Systems Constitutional: Negative for fever. Cardiovascular: Negative for chest pain. Respiratory: Negative for shortness of breath. Gastrointestinal: Positive for abdominal pain, diarrhea Musculoskeletal: Negative for back pain. Skin: Negative for rash. Neurological: Negative for headaches, focal weakness or numbness.  All systems negative/normal/unremarkable except as stated in the HPI  ____________________________________________   PHYSICAL EXAM:  VITAL SIGNS: ED Triage Vitals [01/07/18 2011]  Enc Vitals Group     BP (!) 142/104     Pulse Rate (!) 108     Resp 18     Temp 99.8 F (37.7 C)     Temp Source Oral     SpO2 98 %     Weight 180 lb (81.6 kg)     Height 6\' 2"  (1.88 m)     Head Circumference      Peak Flow    Pain Score 10     Pain Loc      Pain Edu?      Excl. in GC?    Constitutional: Alert and oriented. Well appearing and in no distress. ENT   Head: Normocephalic and atraumatic.   Nose: No congestion/rhinnorhea.   Mouth/Throat: Mucous membranes are moist.   Neck: No stridor. Cardiovascular: Normal rate, regular rhythm. No murmurs, rubs, or gallops. Respiratory: Normal respiratory effort without tachypnea nor retractions. Breath sounds are clear and equal bilaterally. No wheezes/rales/rhonchi. Gastrointestinal: Suprapubic and diffuse lower quadrant tenderness, no rebound or guarding.  Hyperactive bowel sounds. Musculoskeletal: Nontender with normal range of motion in extremities. No lower extremity tenderness nor edema. Neurologic:  Normal speech and language. No gross focal neurologic deficits are appreciated.  Skin:  Skin is warm, dry and intact. No rash noted. Psychiatric: Mood and affect are normal. Speech and behavior are normal.  ____________________________________________  ED COURSE:  As part of my medical decision making, I reviewed the following data within the electronic MEDICAL RECORD NUMBER History obtained from family if available, nursing notes, old chart and ekg, as well as notes from prior ED visits. Patient presented for lower abdominal pain, we will assess with labs and imaging as indicated at this time. Clinical Course as of Jan 08 2211  Fri Jan 07, 2018  2145 Bladder scan was unremarkable   [JW]  2151 Lipase(!): 559 [  JW]  2152 AST(!): 84 [JW]  2152 Patient likely with alcohol induced pancreatitis  ALT(!): 78 [JW]    Clinical Course User Index [JW] [name], MD   Procedures ____________________________________________   LABS (pertinent positives/negatives)  Labs Reviewed  LIPASE, BLOOD - Abnormal; Notable for the following components:      Result Value   Lipase 559 (*)    All other components within normal limits  COMPREHENSIVE  METABOLIC PANEL - Abnormal; Notable for the following components:   Sodium 130 (*)    Chloride 94 (*)    Glucose, Bld 117 (*)    Total Protein 8.5 (*)    AST 84 (*)    ALT 78 (*)    All other components within normal limits  CBC - Abnormal; Notable for the following components:   Platelets 146 (*)    All other components within normal limits  URINALYSIS, COMPLETE (UACMP) WITH MICROSCOPIC - Abnormal; Notable for the following components:   Color, Urine AMBER (*)    APPearance CLEAR (*)    Ketones, ur 20 (*)    Protein, ur >=300 (*)    All other components within normal limits    RADIOLOGY Images were viewed by me  CT of the abdomen pelvis with contrast IMPRESSION: 1. Acute pancreatitis.  No abscess or pseudocyst. 2. Diarrheal state. Correlation with clinical exam and stool cultures recommended. 3. Mucosal enhancement of the hepatic flexure of the colon, likely reactive to inflammatory changes of the pancreas. No bowel obstruction. Normal appendix. 4. Fatty liver. 5. Colonic diverticulosis. 6.  Aortic Atherosclerosis (ICD10-I70.0).  ____________________________________________  DIFFERENTIAL DIAGNOSIS   Cystitis, gastroenteritis, dehydration, electrolyte abnormality, renal colic, appendicitis, obstruction  FINAL ASSESSMENT AND PLAN  Abdominal pain, alcohol induced pancreatitis   Plan: The patient had presented for diffuse lower abdominal pain. Patient's labs did reveal mild elevations in his liver transaminases and a lipase of 559 consistent with pancreatitis likely from daily alcohol intake. Patient's imaging did not reveal any acute process, patient does not want to stay in the hospital, I did offer this on multiple occasions.  I have advised he will absolutely need to stop drinking alcohol and have started him on a Librium taper.  He is advised to return for worsening or worrisome symptoms.   [name], MD   Note: This note was generated in part or  whole with voice recognition software. Voice recognition is usually quite accurate but there are transcription errors that can and very often do occur. I apologize for any typographical errors that were not detected and corrected.     [name], MD 01/07/18 2223

## 2018-01-07 NOTE — ED Notes (Signed)
NAD noted at time of D/C. Pt denies questions or concerns. Pt ambulatory to the lobby at this time.  

## 2018-01-12 ENCOUNTER — Encounter: Payer: Self-pay | Admitting: Family Medicine

## 2018-01-12 ENCOUNTER — Ambulatory Visit: Payer: Medicaid Other | Admitting: Family Medicine

## 2018-01-12 VITALS — BP 123/85 | HR 90 | Temp 99.4°F | Ht 74.0 in | Wt 180.0 lb

## 2018-01-12 DIAGNOSIS — Z7689 Persons encountering health services in other specified circumstances: Secondary | ICD-10-CM | POA: Diagnosis not present

## 2018-01-12 DIAGNOSIS — K760 Fatty (change of) liver, not elsewhere classified: Secondary | ICD-10-CM | POA: Diagnosis not present

## 2018-01-12 DIAGNOSIS — K852 Alcohol induced acute pancreatitis without necrosis or infection: Secondary | ICD-10-CM

## 2018-01-12 DIAGNOSIS — I10 Essential (primary) hypertension: Secondary | ICD-10-CM

## 2018-01-12 DIAGNOSIS — F1010 Alcohol abuse, uncomplicated: Secondary | ICD-10-CM

## 2018-01-12 DIAGNOSIS — R269 Unspecified abnormalities of gait and mobility: Secondary | ICD-10-CM

## 2018-01-12 DIAGNOSIS — I700 Atherosclerosis of aorta: Secondary | ICD-10-CM

## 2018-01-12 DIAGNOSIS — M79602 Pain in left arm: Secondary | ICD-10-CM

## 2018-01-12 MED ORDER — ONDANSETRON 4 MG PO TBDP: 4.0000 mg | ORAL_TABLET | Freq: Three times a day (TID) | ORAL | 0 refills | Status: DC | PRN

## 2018-01-12 NOTE — Progress Notes (Signed)
BP 123/85   Pulse 90   Temp 99.4 F (37.4 C) (Oral)   Ht 6\' 2"  (1.88 m)   Wt 180 lb (81.6 kg)   SpO2 96%   BMI 23.11 kg/m    Subjective:    Patient ID: Charles Lamb, male    DOB: 22-Jul-1960, 57 y.o.   MRN: [id number]  HPI: Charles Lamb is a 57 y.o. male  Chief Complaint  Patient presents with  . New Patient (Initial Visit)  . Back Pain    Right sided pain. Goes away if he lays down.    Here today to establish care.   Recently went to ER for alcohol induced pancreatitis. Noted in ER to have significantly elevated lipase with abdominal pain and nausea/vomiting. Given fluids, pain medication and pt requested to leave rather than be admitted. D/cd home on oxycodone, librium taper, and zofran. Feeling some better, but significant other who is with him today notes constipation and gait issues since starting these medicines.   When asked about his past alcohol consumption daily, he just states "a lot". Has not had a drink since going to the ER and feeling great about it. Has no plans to start drinking again and has the support of his significant other in this.   Left arm stiffness and pain in the morning for years. No injury.   History of high blood pressure, has not been on medications for years.   Past Medical History:  Diagnosis Date  . Hypertension   . Renal disorder    Social History   Socioeconomic History  . Marital status: Single    Spouse name: Not on file  . Number of children: Not on file  . Years of education: Not on file  . Highest education level: Not on file  Occupational History  . Not on file  Social Needs  . Financial resource strain: Not on file  . Food insecurity:    Worry: Not on file    Inability: Not on file  . Transportation needs:    Medical: Not on file    Non-medical: Not on file  Tobacco Use  . Smoking status: Current Every Day Smoker    Packs/day: 0.25    Types: Cigarettes  . Smokeless tobacco: Never Used  Substance and  Sexual Activity  . Alcohol use: Not Currently    Comment: 1 40oz a day  . Drug use: Not Currently  . Sexual activity: Not on file  Lifestyle  . Physical activity:    Days per week: 0 days    Minutes per session: 0 min  . Stress: Not on file  Relationships  . Social connections:    Talks on phone: More than three times a week    Gets together: More than three times a week    Attends religious service: Not on file    Active member of club or organization: Not on file    Attends meetings of clubs or organizations: Not on file    Relationship status: Not on file  . Intimate partner violence:    Fear of current or ex partner: Not on file    Emotionally abused: Not on file    Physically abused: Not on file    Forced sexual activity: Not on file  Other Topics Concern  . Not on file  Social History Narrative  . Not on file    Relevant past medical, surgical, family and social history reviewed and updated as indicated. Interim  medical history since our last visit reviewed. Allergies and medications reviewed and updated.  Review of Systems  Per HPI unless specifically indicated above     Objective:    BP 123/85   Pulse 90   Temp 99.4 F (37.4 C) (Oral)   Ht 6\' 2"  (1.88 m)   Wt 180 lb (81.6 kg)   SpO2 96%   BMI 23.11 kg/m   Wt Readings from Last 3 Encounters:  01/12/18 180 lb (81.6 kg)  01/07/18 180 lb (81.6 kg)    Physical Exam  Constitutional: He is oriented to person, place, and time. He appears well-developed and well-nourished. No distress.  HENT:  Head: Atraumatic.  Eyes: Conjunctivae and EOM are normal.  Neck: Normal range of motion. Neck supple.  Cardiovascular: Normal rate and regular rhythm.  Pulmonary/Chest: Effort normal and breath sounds normal.  Abdominal: Soft. Bowel sounds are normal. He exhibits no mass. There is tenderness (mild diffuse ttp). There is no guarding.  Musculoskeletal: Normal range of motion.  Neurological: He is alert and oriented to  person, place, and time.  Skin: Skin is warm and dry.  Psychiatric: He has a normal mood and affect. His behavior is normal.  Nursing note and vitals reviewed.   Results for orders placed or performed in visit on 01/12/18  Comprehensive metabolic panel  Result Value Ref Range   Glucose 102 (H) 65 - 99 mg/dL   BUN 3 (L) 6 - 24 mg/dL   Creatinine, Ser 1.61 0.76 - 1.27 mg/dL   GFR calc non Af Amer 102 >59 mL/min/1.73   GFR calc Af Amer 118 >59 mL/min/1.73   BUN/Creatinine Ratio 4 (L) 9 - 20   Sodium 134 134 - 144 mmol/L   Potassium 3.7 3.5 - 5.2 mmol/L   Chloride 97 96 - 106 mmol/L   CO2 22 20 - 29 mmol/L   Calcium 8.8 8.7 - 10.2 mg/dL   Total Protein 6.8 6.0 - 8.5 g/dL   Albumin 3.6 3.5 - 5.5 g/dL   Globulin, Total 3.2 1.5 - 4.5 g/dL   Albumin/Globulin Ratio 1.1 (L) 1.2 - 2.2   Bilirubin Total 0.3 0.0 - 1.2 mg/dL   Alkaline Phosphatase 82 39 - 117 IU/L   AST 22 0 - 40 IU/L   ALT 24 0 - 44 IU/L  Lipase  Result Value Ref Range   Lipase 49 13 - 78 U/L      Assessment & Plan:   Problem List Items Addressed This Visit      Cardiovascular and Mediastinum   Hypertension    Stable off medications currently.       Aortic atherosclerosis (HCC)    Will check lipids at upcoming CPE         Digestive   Fatty liver    Per CT abdomen 2019 with intermittently elevated liver enzymes. Will recheck CMP today. Avoid alcohol, work on dietary modifications        Other   Alcohol abuse    5 days in remission since d/c from ER. States he's doing very well, no major withdrawal issues so far and does not feel he will need any outpatient counseling or support groups. Resources reviewed and urged him to reach out if needing support. Does have a significant other with him today who is a major support system for him in coming off alcohol. Risks of future drinking and possible recurrences of pancreatitis reviewed       Other Visit Diagnoses    Alcohol-induced  acute pancreatitis without  infection or necrosis    -  Primary   Relevant Orders   Comprehensive metabolic panel (Completed)   Lipase (Completed)   Encounter to establish care       Left arm pain       Stretches, heat, ibuprofen prn. Will get x-ray if not improving.    Gait difficulty       New onset, suspect from oxycodone and librium combination. D/c both, ibuprofen prn for pain control. Monitor closely for improvement       Follow up plan: Return in about 2 weeks (around 01/26/2018) for CPE.

## 2018-01-13 LAB — COMPREHENSIVE METABOLIC PANEL
A/G RATIO: 1.1 — AB (ref 1.2–2.2)
ALBUMIN: 3.6 g/dL (ref 3.5–5.5)
ALK PHOS: 82 IU/L (ref 39–117)
ALT: 24 IU/L (ref 0–44)
AST: 22 IU/L (ref 0–40)
BILIRUBIN TOTAL: 0.3 mg/dL (ref 0.0–1.2)
BUN/Creatinine Ratio: 4 — ABNORMAL LOW (ref 9–20)
BUN: 3 mg/dL — ABNORMAL LOW (ref 6–24)
CO2: 22 mmol/L (ref 20–29)
Calcium: 8.8 mg/dL (ref 8.7–10.2)
Chloride: 97 mmol/L (ref 96–106)
Creatinine, Ser: 0.77 mg/dL (ref 0.76–1.27)
GFR calc Af Amer: 118 mL/min/{1.73_m2} (ref >59)
GFR, EST NON AFRICAN AMERICAN: 102 mL/min/{1.73_m2} (ref >59)
Globulin, Total: 3.2 g/dL (ref 1.5–4.5)
Glucose: 102 mg/dL — ABNORMAL HIGH (ref 65–99)
POTASSIUM: 3.7 mmol/L (ref 3.5–5.2)
Sodium: 134 mmol/L (ref 134–144)
Total Protein: 6.8 g/dL (ref 6.0–8.5)

## 2018-01-13 LAB — LIPASE: Lipase: 49 U/L (ref 13–78)

## 2018-01-16 DIAGNOSIS — F1010 Alcohol abuse, uncomplicated: Secondary | ICD-10-CM | POA: Insufficient documentation

## 2018-01-16 NOTE — Assessment & Plan Note (Signed)
Per CT abdomen 2019 with intermittently elevated liver enzymes. Will recheck CMP today. Avoid alcohol, work on dietary modifications

## 2018-01-16 NOTE — Assessment & Plan Note (Signed)
Stable off medications currently.

## 2018-01-16 NOTE — Assessment & Plan Note (Signed)
Will check lipids at upcoming CPE

## 2018-01-16 NOTE — Patient Instructions (Signed)
Follow up for CPE 

## 2018-01-16 NOTE — Assessment & Plan Note (Signed)
5 days in remission since d/c from ER. States he's doing very well, no major withdrawal issues so far and does not feel he will need any outpatient counseling or support groups. Resources reviewed and urged him to reach out if needing support. Does have a significant other with him today who is a major support system for him in coming off alcohol. Risks of future drinking and possible recurrences of pancreatitis reviewed

## 2018-01-27 ENCOUNTER — Other Ambulatory Visit: Payer: Self-pay

## 2018-01-27 ENCOUNTER — Ambulatory Visit: Payer: Medicaid Other | Admitting: Family Medicine

## 2018-01-27 ENCOUNTER — Encounter: Payer: Self-pay | Admitting: Family Medicine

## 2018-01-27 VITALS — BP 121/80 | HR 97 | Temp 98.0°F | Ht 74.0 in | Wt 178.0 lb

## 2018-01-27 DIAGNOSIS — Z8719 Personal history of other diseases of the digestive system: Secondary | ICD-10-CM

## 2018-01-27 DIAGNOSIS — E7800 Pure hypercholesterolemia, unspecified: Secondary | ICD-10-CM | POA: Diagnosis not present

## 2018-01-27 DIAGNOSIS — I10 Essential (primary) hypertension: Secondary | ICD-10-CM | POA: Diagnosis not present

## 2018-01-27 DIAGNOSIS — Z1159 Encounter for screening for other viral diseases: Secondary | ICD-10-CM

## 2018-01-27 DIAGNOSIS — M25512 Pain in left shoulder: Secondary | ICD-10-CM | POA: Diagnosis not present

## 2018-01-27 DIAGNOSIS — Z23 Encounter for immunization: Secondary | ICD-10-CM | POA: Diagnosis not present

## 2018-01-27 DIAGNOSIS — G8929 Other chronic pain: Secondary | ICD-10-CM

## 2018-01-27 DIAGNOSIS — Z0000 Encounter for general adult medical examination without abnormal findings: Secondary | ICD-10-CM

## 2018-01-27 DIAGNOSIS — F1010 Alcohol abuse, uncomplicated: Secondary | ICD-10-CM

## 2018-01-27 DIAGNOSIS — Z114 Encounter for screening for human immunodeficiency virus [HIV]: Secondary | ICD-10-CM

## 2018-01-27 LAB — UA/M W/RFLX CULTURE, ROUTINE
Bilirubin, UA: NEGATIVE
Glucose, UA: NEGATIVE
KETONES UA: NEGATIVE
Leukocytes, UA: NEGATIVE
NITRITE UA: NEGATIVE
RBC UA: NEGATIVE
UUROB: 0.2 mg/dL (ref 0.2–1.0)
pH, UA: 6 (ref 5.0–7.5)

## 2018-01-27 NOTE — Patient Instructions (Signed)
Follow up for CPE 

## 2018-01-27 NOTE — Assessment & Plan Note (Signed)
Quit "cold [place]" after ER visit for alcoholic pancreatitis and feeling great about it. Continue off alcohol. Resources and counseling offered, pt declines

## 2018-01-27 NOTE — Assessment & Plan Note (Signed)
Currently diet controlled, check lipids today and adjust as needed. Pt recently stopped drinking alcohol, suspect numbers will improve accordingly

## 2018-01-27 NOTE — Assessment & Plan Note (Signed)
Stable off medications. Continue control with lifestyle modifications.

## 2018-01-27 NOTE — Progress Notes (Signed)
BP 121/80   Pulse 97   Temp 98 F (36.7 C) (Oral)   Ht 6\' 2"  (1.88 m)   Wt 178 lb (80.7 kg)   SpO2 96%   BMI 22.85 kg/m    Subjective:    Patient ID: Charles Lamb, male    DOB: 11-Oct-1960, 57 y.o.   MRN: [id number]  HPI: Charles RADIN is a 57 y.o. male presenting on 01/27/2018 for comprehensive medical examination. Current medical complaints include:see below  Left shoulder pain no better despite NSAIDs, stretches, heat. Requesting x-ray. This has been ongoing for years.   Feeling much better from his pancreatitis episode several weeks ago. Has not drank since d/c from the ER and feeling great about it. Denies abdominal pain, N/V/D, fevers.   He currently lives with: Interim Problems from his last visit: no  Depression Screen done today and results listed below:  Depression screen [facility] 2/9 01/27/2018 01/12/2018  Decreased Interest - 2  Down, Depressed, Hopeless - 0  PHQ - 2 Score - 2  Altered sleeping 0 2  Tired, decreased energy 3 1  Change in appetite 0 0  Feeling bad or failure about yourself  0 0  Trouble concentrating 0 0  Moving slowly or fidgety/restless 0 0  Suicidal thoughts 0 0  PHQ-9 Score - 5    The patient does not have a history of falls. I did not complete a risk assessment for falls. A plan of care for falls was not documented.   Past Medical History:  Past Medical History:  Diagnosis Date  . Hypertension   . Renal disorder     Surgical History:  Past Surgical History:  Procedure Laterality Date  . EYE SURGERY Left     Medications:  No current outpatient medications on file prior to visit.   No current facility-administered medications on file prior to visit.     Allergies:  No Known Allergies  Social History:  Social History   Socioeconomic History  . Marital status: Single    Spouse name: Not on file  . Number of children: Not on file  . Years of education: Not on file  . Highest education level: Not on file    Occupational History  . Not on file  Social Needs  . Financial resource strain: Not on file  . Food insecurity:    Worry: Not on file    Inability: Not on file  . Transportation needs:    Medical: Not on file    Non-medical: Not on file  Tobacco Use  . Smoking status: Current Every Day Smoker    Packs/day: 0.25    Types: Cigarettes  . Smokeless tobacco: Never Used  Substance and Sexual Activity  . Alcohol use: Not Currently    Comment: 1 40oz a day  . Drug use: Not Currently  . Sexual activity: Not on file  Lifestyle  . Physical activity:    Days per week: 0 days    Minutes per session: 0 min  . Stress: Not on file  Relationships  . Social connections:    Talks on phone: More than three times a week    Gets together: More than three times a week    Attends religious service: Not on file    Active member of club or organization: Not on file    Attends meetings of clubs or organizations: Not on file    Relationship status: Not on file  . Intimate partner violence:  Fear of current or ex partner: Not on file    Emotionally abused: Not on file    Physically abused: Not on file    Forced sexual activity: Not on file  Other Topics Concern  . Not on file  Social History Narrative  . Not on file   Social History   Tobacco Use  Smoking Status Current Every Day Smoker  . Packs/day: 0.25  . Types: Cigarettes  Smokeless Tobacco Never Used   Social History   Substance and Sexual Activity  Alcohol Use Not Currently   Comment: 1 40oz a day    Family History:  Family History  Problem Relation Age of Onset  . Kidney disease Mother   . Kidney disease Brother     Past medical history, surgical history, medications, allergies, family history and social history reviewed with patient today and changes made to appropriate areas of the chart.   Review of Systems - General ROS: negative Psychological ROS: negative Ophthalmic ROS: negative ENT ROS: negative Allergy  and Immunology ROS: negative Hematological and Lymphatic ROS: negative Endocrine ROS: negative Respiratory ROS: no cough, shortness of breath, or wheezing Cardiovascular ROS: no chest pain or dyspnea on exertion Gastrointestinal ROS: no abdominal pain, change in bowel habits, or black or bloody stools Genito-Urinary ROS: no dysuria, trouble voiding, or hematuria Musculoskeletal ROS: negative Neurological ROS: no TIA or stroke symptoms Dermatological ROS: negative All other ROS negative except what is listed above and in the HPI.      Objective:    BP 121/80   Pulse 97   Temp 98 F (36.7 C) (Oral)   Ht 6\' 2"  (1.88 m)   Wt 178 lb (80.7 kg)   SpO2 96%   BMI 22.85 kg/m   Wt Readings from Last 3 Encounters:  01/27/18 178 lb (80.7 kg)  01/12/18 180 lb (81.6 kg)  01/07/18 180 lb (81.6 kg)    Physical Exam  Constitutional: He is oriented to person, place, and time. He appears well-developed and well-nourished. No distress.  HENT:  Head: Atraumatic.  Right Ear: External ear normal.  Left Ear: External ear normal.  Nose: Nose normal.  Mouth/Throat: Oropharynx is clear and moist.  Eyes: Pupils are equal, round, and reactive to light. Conjunctivae are normal. No scleral icterus.  Neck: Normal range of motion. Neck supple.  Cardiovascular: Normal rate, regular rhythm, normal heart sounds and intact distal pulses.  No murmur heard. Pulmonary/Chest: Effort normal and breath sounds normal. No respiratory distress.  Abdominal: Soft. Bowel sounds are normal. He exhibits no distension and no mass. There is no tenderness. There is no guarding.  Musculoskeletal: Normal range of motion. He exhibits tenderness (anterior left shoulder ttp). He exhibits no edema.  Significant crepitus to PROM left shoulder  Neurological: He is alert and oriented to person, place, and time. He has normal reflexes.  Skin: Skin is warm and dry. No rash noted.  Psychiatric: He has a normal mood and affect. His  behavior is normal.  Nursing note and vitals reviewed.   Results for orders placed or performed in visit on 01/27/18  UA/M w/rflx Culture, Routine  Result Value Ref Range   Specific Gravity, UA <1.005 (L) 1.005 - 1.030   pH, UA 6.0 5.0 - 7.5   Color, UA Yellow Yellow   Appearance Ur Clear Clear   Leukocytes, UA Negative Negative   Protein, UA Trace (A) Negative/Trace   Glucose, UA Negative Negative   Ketones, UA Negative Negative   RBC, UA  Negative Negative   Bilirubin, UA Negative Negative   Urobilinogen, Ur 0.2 0.2 - 1.0 mg/dL   Nitrite, UA Negative Negative      Assessment & Plan:   Problem List Items Addressed This Visit      Cardiovascular and Mediastinum   Hypertension    Stable off medications. Continue control with lifestyle modifications.       Relevant Orders   CBC with Differential/Platelet   Comprehensive metabolic panel   UA/M w/rflx Culture, Routine (Completed)     Other   Hypercholesteremia - Primary    Currently diet controlled, check lipids today and adjust as needed. Pt recently stopped drinking alcohol, suspect numbers will improve accordingly      Relevant Orders   Lipid Panel w/o Chol/HDL Ratio   Alcohol abuse    Quit "cold [place]" after ER visit for alcoholic pancreatitis and feeling great about it. Continue off alcohol. Resources and counseling offered, pt declines       Other Visit Diagnoses    Annual physical exam       Chronic left shoulder pain       Will obtain x-ray, likely will refer to orthopedics for further management given chronicity   Relevant Orders   DG Shoulder Left   Hx of pancreatitis       Relevant Orders   Lipase   Encounter for screening for HIV       Relevant Orders   HIV Antibody (routine testing w rflx)   Need for hepatitis C screening test       Relevant Orders   Hepatitis C antibody   Need for Td vaccine       Relevant Orders   Td vaccine greater than or equal to 7yo preservative free IM (Completed)        Discussed aspirin prophylaxis for myocardial infarction prevention and decision was it was not indicated  LABORATORY TESTING:  Health maintenance labs ordered today as discussed above.   The natural history of prostate cancer and ongoing controversy regarding screening and potential treatment outcomes of prostate cancer has been discussed with the patient. The meaning of a false positive PSA and a false negative PSA has been discussed. He indicates understanding of the limitations of this screening test and wishes not to proceed with screening PSA testing.   IMMUNIZATIONS:   - Tdap: Tetanus vaccination status reviewed: Td vaccination indicated and given today. - Influenza: Refused  SCREENING: - Colonoscopy: Up to date  Discussed with patient purpose of the colonoscopy is to detect colon cancer at curable precancerous or early stages   PATIENT COUNSELING:    Sexuality: Discussed sexually transmitted diseases, partner selection, use of condoms, avoidance of unintended pregnancy  and contraceptive alternatives.   Advised to avoid cigarette smoking.  I discussed with the patient that most people either abstain from alcohol or drink within safe limits (<=14/week and <=4 drinks/occasion for males, <=7/weeks and <= 3 drinks/occasion for females) and that the risk for alcohol disorders and other health effects rises proportionally with the number of drinks per week and how often a drinker exceeds daily limits.  Discussed cessation/primary prevention of drug use and availability of treatment for abuse.   Diet: Encouraged to adjust caloric intake to maintain  or achieve ideal body weight, to reduce intake of dietary saturated fat and total fat, to limit sodium intake by avoiding high sodium foods and not adding table salt, and to maintain adequate dietary potassium and calcium preferably from fresh fruits, vegetables,  and low-fat dairy products.    stressed the importance of regular  exercise  Injury prevention: Discussed safety belts, safety helmets, smoke detector, smoking near bedding or upholstery.   Dental health: Discussed importance of regular tooth brushing, flossing, and dental visits.   Follow up plan: NEXT PREVENTATIVE PHYSICAL DUE IN 1 YEAR. Return in about 1 year (around 01/28/2019) for CPE.

## 2018-01-28 ENCOUNTER — Encounter: Payer: Self-pay | Admitting: Family Medicine

## 2018-01-28 LAB — LIPID PANEL W/O CHOL/HDL RATIO
Cholesterol, Total: 186 mg/dL (ref 100–199)
HDL: 47 mg/dL (ref >39)
LDL CALC: 107 mg/dL — AB (ref 0–99)
Triglycerides: 158 mg/dL — ABNORMAL HIGH (ref 0–149)
VLDL Cholesterol Cal: 32 mg/dL (ref 5–40)

## 2018-01-28 LAB — CBC WITH DIFFERENTIAL/PLATELET
BASOS: 1 %
Basophils Absolute: 0.1 10*3/uL (ref 0.0–0.2)
EOS (ABSOLUTE): 0.1 10*3/uL (ref 0.0–0.4)
EOS: 1 %
HEMATOCRIT: 34.8 % — AB (ref 37.5–51.0)
HEMOGLOBIN: 12.3 g/dL — AB (ref 13.0–17.7)
Immature Grans (Abs): 0.1 10*3/uL (ref 0.0–0.1)
Immature Granulocytes: 1 %
LYMPHS ABS: 1.1 10*3/uL (ref 0.7–3.1)
Lymphs: 16 %
MCH: 32.2 pg (ref 26.6–33.0)
MCHC: 35.3 g/dL (ref 31.5–35.7)
MCV: 91 fL (ref 79–97)
MONOCYTES: 8 %
Monocytes Absolute: 0.6 10*3/uL (ref 0.1–0.9)
Neutrophils Absolute: 5.2 10*3/uL (ref 1.4–7.0)
Neutrophils: 73 %
Platelets: 388 10*3/uL (ref 150–450)
RBC: 3.82 x10E6/uL — AB (ref 4.14–5.80)
RDW: 12.9 % (ref 12.3–15.4)
WBC: 7.1 10*3/uL (ref 3.4–10.8)

## 2018-01-28 LAB — COMPREHENSIVE METABOLIC PANEL
A/G RATIO: 1.2 (ref 1.2–2.2)
ALBUMIN: 3.8 g/dL (ref 3.5–5.5)
ALT: 10 IU/L (ref 0–44)
AST: 16 IU/L (ref 0–40)
Alkaline Phosphatase: 84 IU/L (ref 39–117)
BUN / CREAT RATIO: 7 — AB (ref 9–20)
BUN: 5 mg/dL — ABNORMAL LOW (ref 6–24)
Bilirubin Total: <0.2 mg/dL (ref 0.0–1.2)
CALCIUM: 9.3 mg/dL (ref 8.7–10.2)
CO2: 19 mmol/L — ABNORMAL LOW (ref 20–29)
CREATININE: 0.69 mg/dL — AB (ref 0.76–1.27)
Chloride: 100 mmol/L (ref 96–106)
GFR, EST AFRICAN AMERICAN: 124 mL/min/{1.73_m2} (ref >59)
GFR, EST NON AFRICAN AMERICAN: 107 mL/min/{1.73_m2} (ref >59)
GLOBULIN, TOTAL: 3.2 g/dL (ref 1.5–4.5)
Glucose: 103 mg/dL — ABNORMAL HIGH (ref 65–99)
Potassium: 3.6 mmol/L (ref 3.5–5.2)
SODIUM: 137 mmol/L (ref 134–144)
Total Protein: 7 g/dL (ref 6.0–8.5)

## 2018-01-28 LAB — HIV ANTIBODY (ROUTINE TESTING W REFLEX): HIV SCREEN 4TH GENERATION: NONREACTIVE

## 2018-01-28 LAB — LIPASE: LIPASE: 69 U/L (ref 13–78)

## 2018-01-28 LAB — HEPATITIS C ANTIBODY

## 2018-01-31 ENCOUNTER — Other Ambulatory Visit: Payer: Self-pay | Admitting: Family Medicine

## 2018-01-31 ENCOUNTER — Ambulatory Visit
Admission: RE | Admit: 2018-01-31 | Discharge: 2018-01-31 | Disposition: A | Discharge status: Home / Self Care | Payer: Medicaid Other | Source: Ambulatory Visit | Attending: Family Medicine | Admitting: Family Medicine

## 2018-01-31 DIAGNOSIS — M25512 Pain in left shoulder: Principal | ICD-10-CM

## 2018-01-31 DIAGNOSIS — G8929 Other chronic pain: Secondary | ICD-10-CM | POA: Diagnosis not present

## 2018-01-31 IMAGING — CR DG SHOULDER 2+V*L*
1 series · 3 of 3 positions shown · non-contrast
Comparison: None.

CLINICAL DATA: Chronic left shoulder pain for several years.

EXAM:
LEFT SHOULDER - 2+ VIEW

[Series 1: dg shoulder left · 0.14mm/px · 3 of 3 slices shown]
[im 1/3]
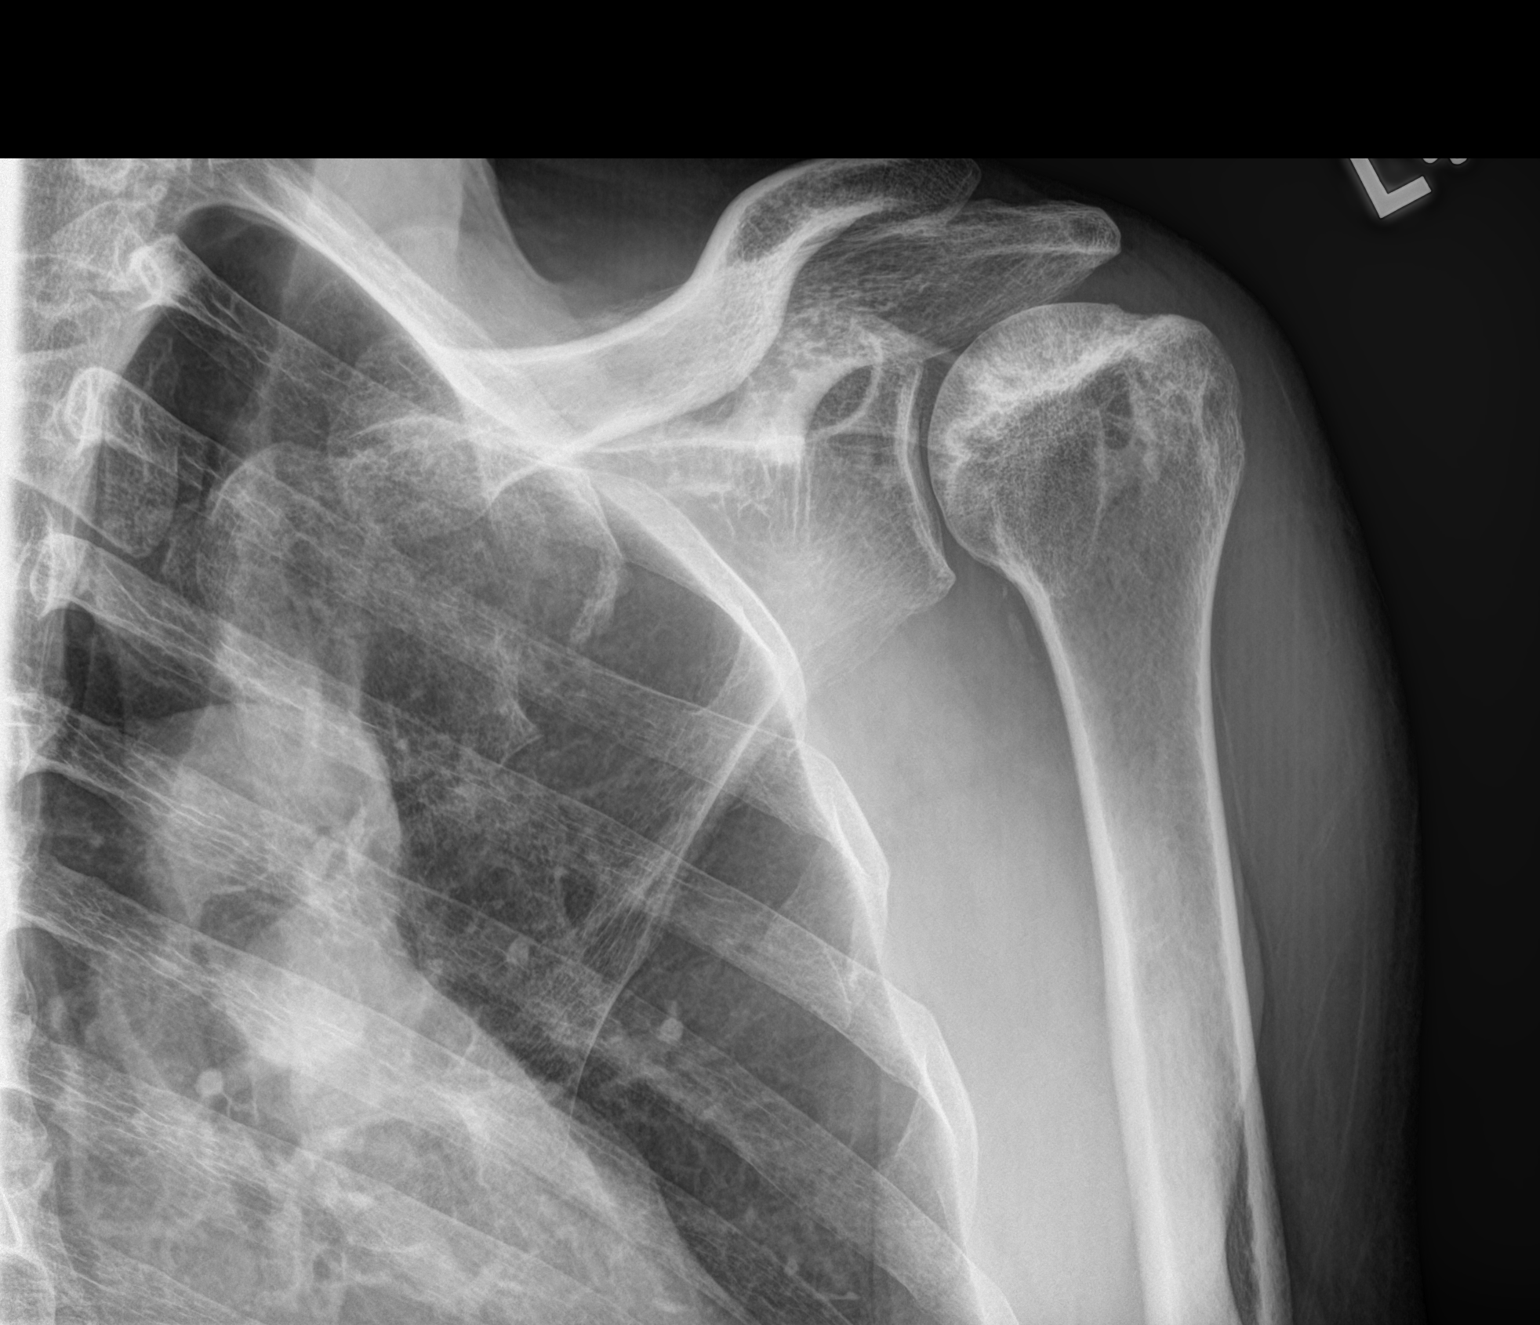
[im 2/3]
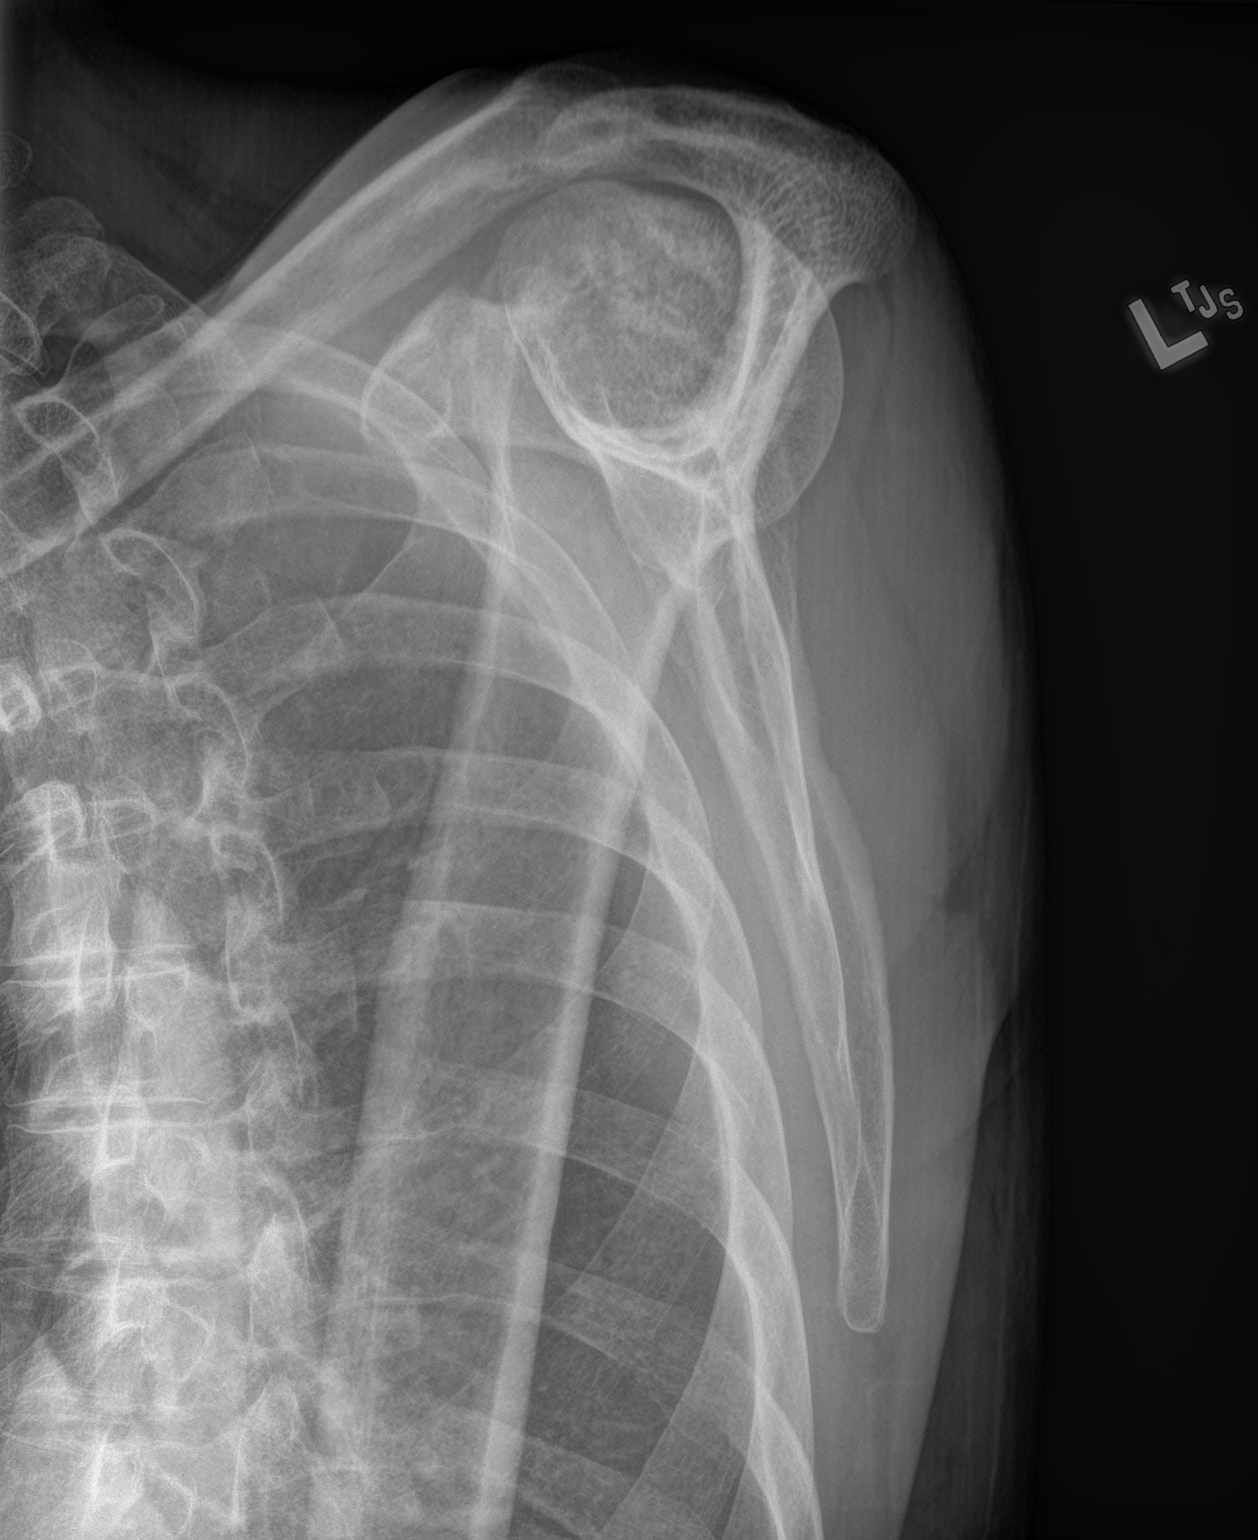
[im 3/3]
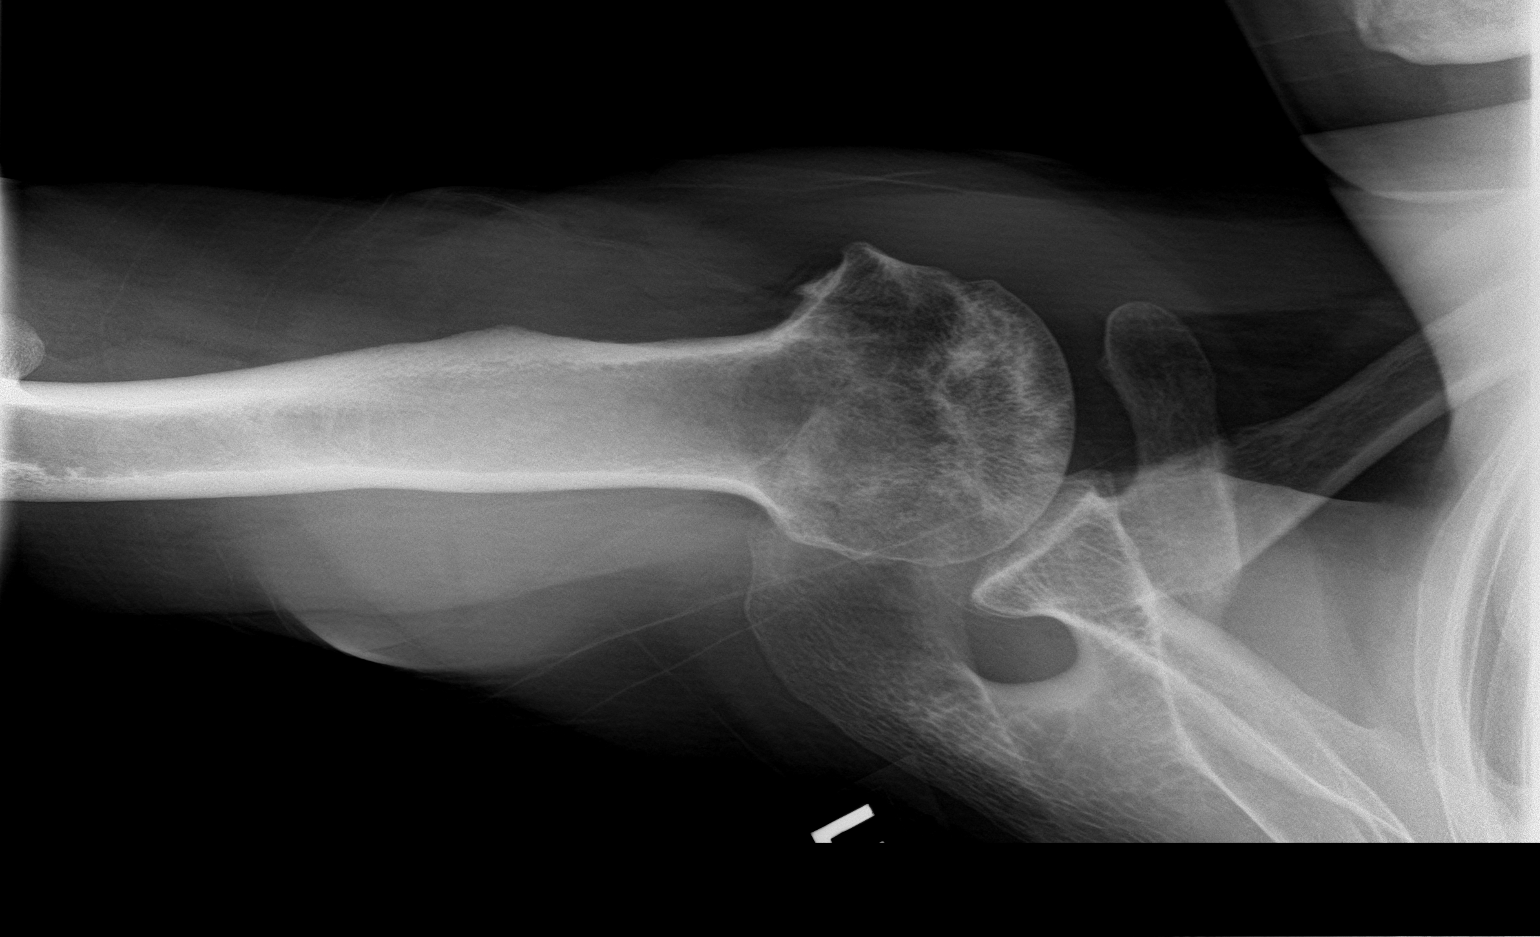

[3 of 3 positions shown; findings below may reference images not displayed]

FINDINGS: There is no evidence of fracture or dislocation. There is no
evidence of arthropathy. Ill-defined sclerosis is seen in the left
humeral head, without articular collapse. This is suspicious for
bone infarct or avascular necrosis.
IMPRESSION: No acute findings.

Chronic bone infarct or avascular necrosis involving the humeral
head. Consider shoulder MRI for further evaluation.

## 2018-03-07 ENCOUNTER — Other Ambulatory Visit: Payer: Self-pay | Admitting: Orthopedic Surgery

## 2018-03-07 ENCOUNTER — Other Ambulatory Visit (HOSPITAL_COMMUNITY): Payer: Self-pay | Admitting: Orthopedic Surgery

## 2018-03-07 DIAGNOSIS — M25512 Pain in left shoulder: Secondary | ICD-10-CM

## 2018-03-26 ENCOUNTER — Ambulatory Visit: Payer: Medicaid Other

## 2018-03-30 ENCOUNTER — Ambulatory Visit: Payer: Self-pay | Admitting: Orthopedic Surgery

## 2018-04-05 ENCOUNTER — Other Ambulatory Visit: Payer: Self-pay | Admitting: Orthopedic Surgery

## 2018-04-05 DIAGNOSIS — M19012 Primary osteoarthritis, left shoulder: Secondary | ICD-10-CM

## 2018-04-05 DIAGNOSIS — M25512 Pain in left shoulder: Secondary | ICD-10-CM

## 2018-04-15 ENCOUNTER — Ambulatory Visit
Admission: RE | Admit: 2018-04-15 | Discharge: 2018-04-15 | Disposition: A | Discharge status: Home / Self Care | Payer: Medicaid Other | Source: Ambulatory Visit | Attending: Orthopedic Surgery | Admitting: Orthopedic Surgery

## 2018-04-15 DIAGNOSIS — M25512 Pain in left shoulder: Secondary | ICD-10-CM | POA: Diagnosis not present

## 2018-04-15 DIAGNOSIS — M19012 Primary osteoarthritis, left shoulder: Secondary | ICD-10-CM | POA: Diagnosis present

## 2018-04-15 IMAGING — CT CT SHOULDER*L* W/O CM
2 of 3 series · 8 of 27 positions shown, 10 images · non-contrast
Comparison: Left shoulder x-rays dated January 31, 2018.

CLINICAL DATA: Chronic left shoulder pain.

EXAM:
CT OF THE UPPER LEFT EXTREMITY WITHOUT CONTRAST
TECHNIQUE: Multidetector CT imaging of the upper left extremity was performed
according to the standard protocol.

[Series 7: ax st · axial · 0.39mm/px · z∈[-330,-101]mm · 3 of 123 slices shown, 4 images]
[im 1/123  soft-tissue]
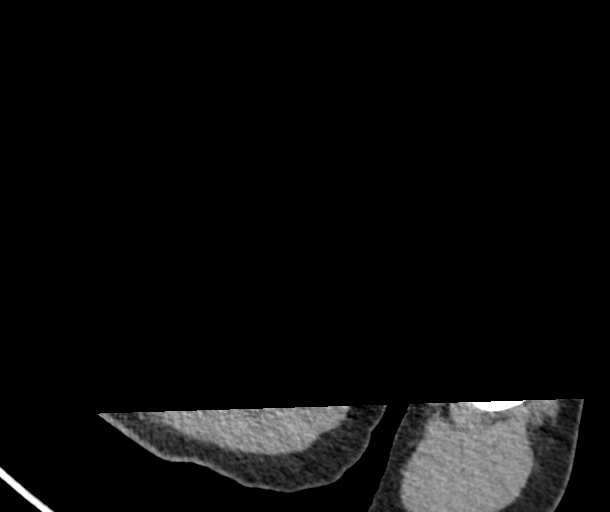
[im 1/123  bone]
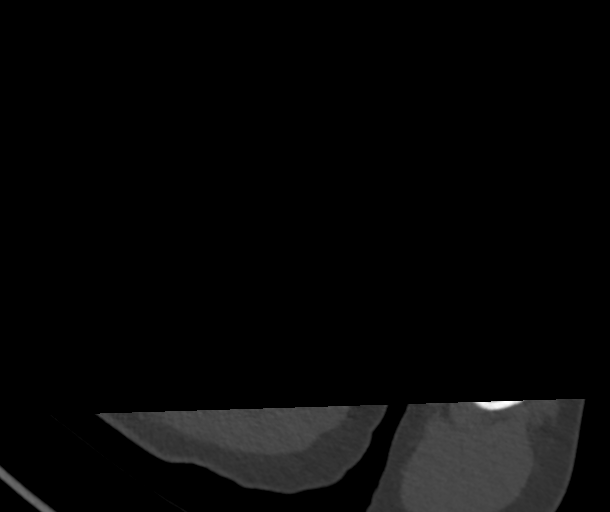
[im 62/123  bone]
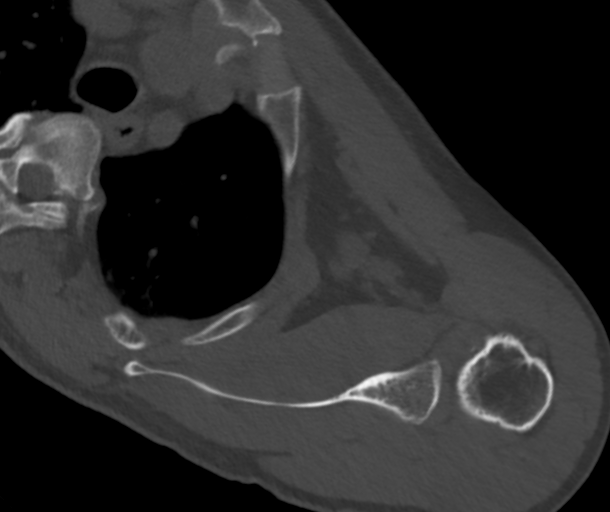
[im 123/123  bone]
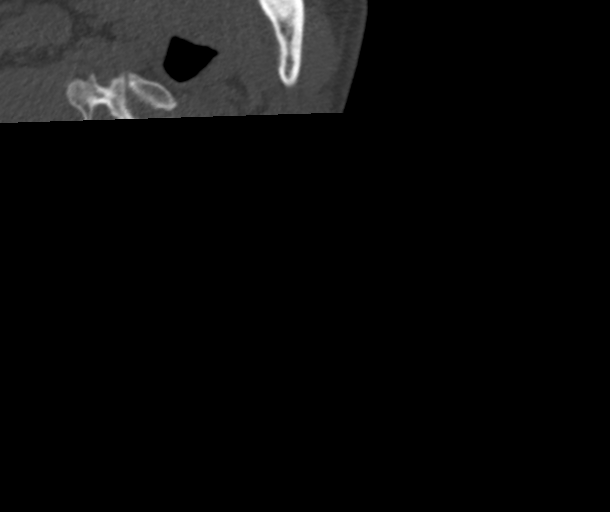

[Series 9: sag st · sagittal · 0.39mm/px · 5 of 119 slices shown, 6 images]
[im 40/119  bone]
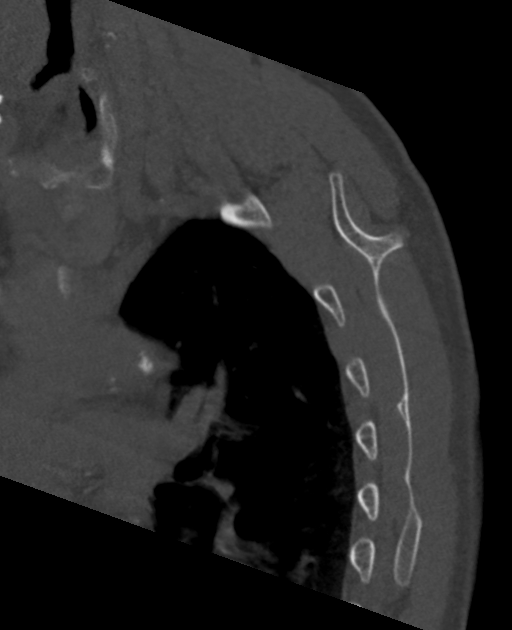
[im 50/119  bone]
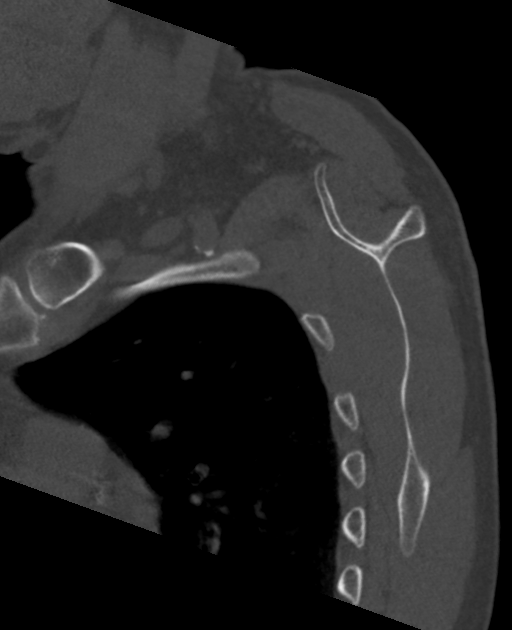
[im 60/119  soft-tissue]
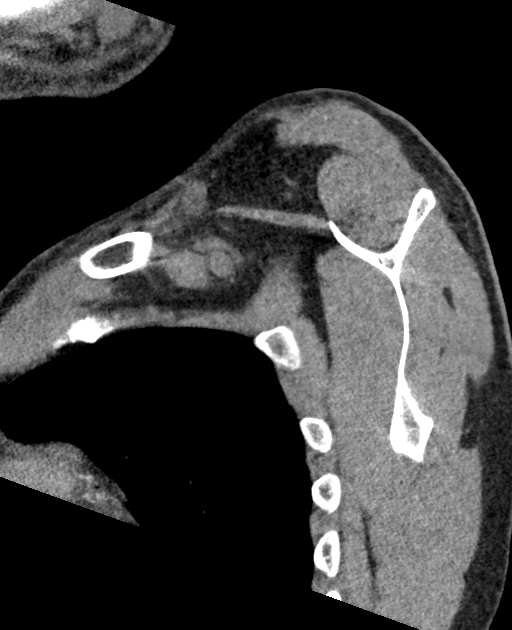
[im 60/119  bone]
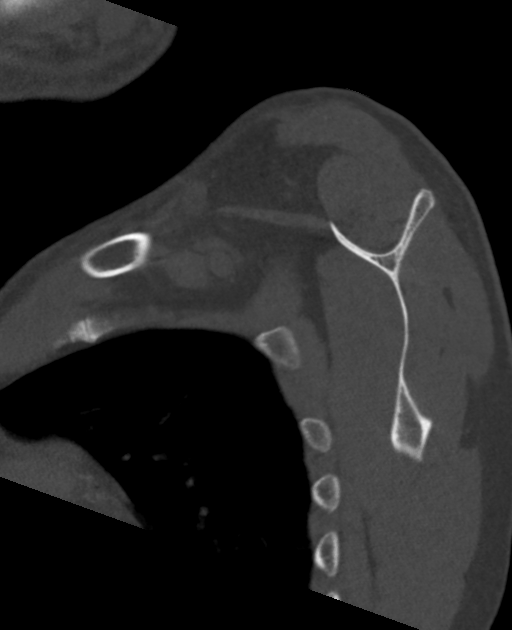
[im 69/119  bone]
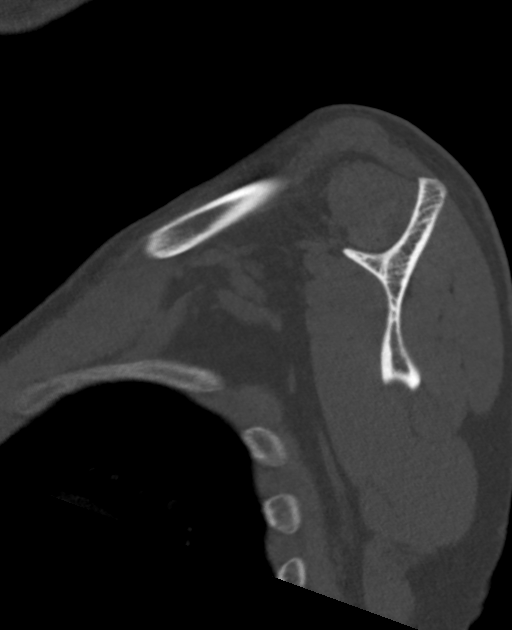
[im 79/119  bone]
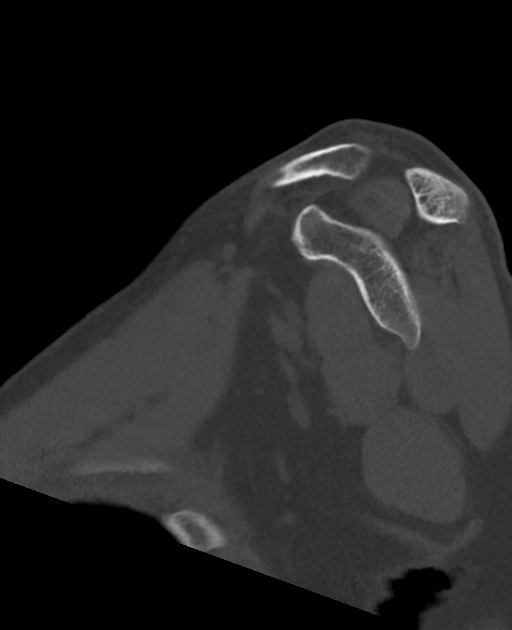

[8 of 27 positions shown; findings below may reference images not displayed]

FINDINGS: Bones/Joint/Cartilage

Unchanged serpiginous sclerosis within the humeral head, consistent
with avascular necrosis. No subchondral collapse. No fracture or
dislocation. Normal alignment. No joint effusion.

Minimal glenohumeral joint space narrowing.

Minimal arthropathy of the acromioclavicular joint. Type II
acromion.

Multilevel moderate cervical spondylosis.

Ligaments

Ligaments are suboptimally evaluated by CT.

Muscles and Tendons
Muscles are normal.  No muscle atrophy.

Soft tissue
No fluid collection or hematoma. No soft tissue mass. Mild
centrilobular emphysema. Thoracic aortic, coronary, and bilateral
carotid atherosclerotic vascular calcifications.
IMPRESSION: 1. Unchanged avascular necrosis of the humeral head without
subchondral collapse.
2. Minimal glenohumeral and acromioclavicular degenerative changes.

## 2018-05-11 ENCOUNTER — Inpatient Hospital Stay: Admission: RE | Admit: 2018-05-11 | Payer: Medicaid Other | Source: Ambulatory Visit

## 2018-05-18 ENCOUNTER — Encounter
Admission: RE | Admit: 2018-05-18 | Discharge: 2018-05-18 | Disposition: A | Discharge status: Home / Self Care | Payer: Medicaid Other | Source: Ambulatory Visit | Attending: Orthopedic Surgery | Admitting: Orthopedic Surgery

## 2018-05-18 ENCOUNTER — Encounter: Payer: Self-pay | Admitting: Family Medicine

## 2018-05-18 ENCOUNTER — Ambulatory Visit (INDEPENDENT_AMBULATORY_CARE_PROVIDER_SITE_OTHER): Payer: Medicaid Other | Admitting: Family Medicine

## 2018-05-18 ENCOUNTER — Other Ambulatory Visit: Payer: Self-pay

## 2018-05-18 DIAGNOSIS — Z01818 Encounter for other preprocedural examination: Secondary | ICD-10-CM | POA: Insufficient documentation

## 2018-05-18 DIAGNOSIS — M19012 Primary osteoarthritis, left shoulder: Secondary | ICD-10-CM | POA: Insufficient documentation

## 2018-05-18 DIAGNOSIS — I10 Essential (primary) hypertension: Secondary | ICD-10-CM | POA: Diagnosis not present

## 2018-05-18 DIAGNOSIS — R9431 Abnormal electrocardiogram [ECG] [EKG]: Secondary | ICD-10-CM | POA: Diagnosis not present

## 2018-05-18 LAB — URINALYSIS, ROUTINE W REFLEX MICROSCOPIC
Bilirubin Urine: NEGATIVE
Glucose, UA: NEGATIVE mg/dL
Hgb urine dipstick: NEGATIVE
Ketones, ur: NEGATIVE mg/dL
Leukocytes,Ua: NEGATIVE
Nitrite: NEGATIVE
Protein, ur: 30 mg/dL — AB
SQUAMOUS EPITHELIAL / LPF: NONE SEEN (ref 0–5)
Specific Gravity, Urine: 1.013 (ref 1.005–1.030)
pH: 6 (ref 5.0–8.0)

## 2018-05-18 LAB — BASIC METABOLIC PANEL
Anion gap: 11 (ref 5–15)
BUN: 10 mg/dL (ref 6–20)
CO2: 23 mmol/L (ref 22–32)
Calcium: 9.5 mg/dL (ref 8.9–10.3)
Chloride: 105 mmol/L (ref 98–111)
Creatinine, Ser: 0.85 mg/dL (ref 0.61–1.24)
GFR calc Af Amer: >60 mL/min (ref >60)
GFR calc non Af Amer: >60 mL/min (ref >60)
Glucose, Bld: 108 mg/dL — ABNORMAL HIGH (ref 70–99)
Potassium: 3.9 mmol/L (ref 3.5–5.1)
Sodium: 139 mmol/L (ref 135–145)

## 2018-05-18 LAB — CBC
HCT: 43.1 % (ref 39.0–52.0)
Hemoglobin: 13.8 g/dL (ref 13.0–17.0)
MCH: 29.3 pg (ref 26.0–34.0)
MCHC: 32 g/dL (ref 30.0–36.0)
MCV: 91.5 fL (ref 80.0–100.0)
Platelets: 258 10*3/uL (ref 150–400)
RBC: 4.71 MIL/uL (ref 4.22–5.81)
RDW: 13.2 % (ref 11.5–15.5)
WBC: 6 10*3/uL (ref 4.0–10.5)
nRBC: 0 % (ref 0.0–0.2)

## 2018-05-18 LAB — APTT: APTT: 30 s (ref 24–36)

## 2018-05-18 LAB — SURGICAL PCR SCREEN
MRSA, PCR: NEGATIVE
STAPHYLOCOCCUS AUREUS: NEGATIVE

## 2018-05-18 LAB — PROTIME-INR
INR: 1 (ref 0.8–1.2)
Prothrombin Time: 13.2 seconds (ref 11.4–15.2)

## 2018-05-18 NOTE — Progress Notes (Signed)
BP 130/90   Pulse 89   Temp 97.9 F (36.6 C) (Oral)   Wt 187 lb 12.8 oz (85.2 kg)   SpO2 100%   BMI 24.44 kg/m    Subjective:    Patient ID: Charles Lamb, male    DOB: 02/26/1961, 58 y.o.   MRN: [id number]  HPI: Charles Lamb is a 58 y.o. male  Chief Complaint  Patient presents with  . Surgery Clearance   Patient all in all doing well no complaints.  Has chronic left shoulder pains getting ready for major shoulder surgery coming up next week. All in all is doing well with no chest pain chest tightness shortness of breath PND orthopnea.  Is able to be physically active without problems or unusual shortness of breath or chest pain. Otherwise patient doing well with no complaints no medication having some pain issues with pain control and especially difficulty sleeping at night.   Relevant past medical, surgical, family and social history reviewed and updated as indicated. Interim medical history since our last visit reviewed. Allergies and medications reviewed and updated.  Review of Systems  Constitutional: Negative.   HENT: Negative.   Eyes: Negative.   Respiratory: Negative.   Cardiovascular: Negative.   Gastrointestinal: Negative.   Endocrine: Negative.   Genitourinary: Negative.   Musculoskeletal: Negative.   Skin: Negative.   Allergic/Immunologic: Negative.   Neurological: Negative.   Hematological: Negative.   Psychiatric/Behavioral: Negative.     Per HPI unless specifically indicated above     Objective:    BP 130/90   Pulse 89   Temp 97.9 F (36.6 C) (Oral)   Wt 187 lb 12.8 oz (85.2 kg)   SpO2 100%   BMI 24.44 kg/m   Wt Readings from Last 3 Encounters:  05/18/18 187 lb 12.8 oz (85.2 kg)  05/18/18 187 lb (84.8 kg)  01/27/18 178 lb (80.7 kg)    Physical Exam Constitutional:      Appearance: He is well-developed.  HENT:     Head: Normocephalic and atraumatic.  Eyes:     Conjunctiva/sclera: Conjunctivae normal.  Neck:   Musculoskeletal: Normal range of motion.  Cardiovascular:     Rate and Rhythm: Normal rate and regular rhythm.     Heart sounds: Normal heart sounds.  Pulmonary:     Effort: Pulmonary effort is normal.     Breath sounds: Normal breath sounds.  Musculoskeletal: Normal range of motion.  Skin:    Findings: No erythema.  Neurological:     Mental Status: He is alert and oriented to person, place, and time.  Psychiatric:        Behavior: Behavior normal.        Thought Content: Thought content normal.        Judgment: Judgment normal.     Results for orders placed or performed during the hospital encounter of 05/18/18  Surgical pcr screen  Result Value Ref Range   MRSA, PCR NEGATIVE NEGATIVE   Staphylococcus aureus NEGATIVE NEGATIVE  APTT  Result Value Ref Range   aPTT 30 24 - 36 seconds  Basic metabolic panel  Result Value Ref Range   Sodium 139 135 - 145 mmol/L   Potassium 3.9 3.5 - 5.1 mmol/L   Chloride 105 98 - 111 mmol/L   CO2 23 22 - 32 mmol/L   Glucose, Bld 108 (H) 70 - 99 mg/dL   BUN 10 6 - 20 mg/dL   Creatinine, Ser 1.02 0.61 - 1.24 mg/dL  Calcium 9.5 8.9 - 10.3 mg/dL   GFR calc non Af Amer >60 >60 mL/min   GFR calc Af Amer >60 >60 mL/min   Anion gap 11 5 - 15  CBC  Result Value Ref Range   WBC 6.0 4.0 - 10.5 K/uL   RBC 4.71 4.22 - 5.81 MIL/uL   Hemoglobin 13.8 13.0 - 17.0 g/dL   HCT 27.2 53.6 - 64.4 %   MCV 91.5 80.0 - 100.0 fL   MCH 29.3 26.0 - 34.0 pg   MCHC 32.0 30.0 - 36.0 g/dL   RDW 03.4 74.2 - 59.5 %   Platelets 258 150 - 400 K/uL   nRBC 0.0 0.0 - 0.2 %  Protime-INR  Result Value Ref Range   Prothrombin Time 13.2 11.4 - 15.2 seconds   INR 1.0 0.8 - 1.2  Urinalysis, Routine w reflex microscopic  Result Value Ref Range   Color, Urine YELLOW (A) YELLOW   APPearance HAZY (A) CLEAR   Specific Gravity, Urine 1.013 1.005 - 1.030   pH 6.0 5.0 - 8.0   Glucose, UA NEGATIVE NEGATIVE mg/dL   Hgb urine dipstick NEGATIVE NEGATIVE   Bilirubin Urine NEGATIVE  NEGATIVE   Ketones, ur NEGATIVE NEGATIVE mg/dL   Protein, ur 30 (A) NEGATIVE mg/dL   Nitrite NEGATIVE NEGATIVE   Leukocytes,Ua NEGATIVE NEGATIVE   RBC / HPF 6-10 0 - 5 RBC/hpf   WBC, UA 0-5 0 - 5 WBC/hpf   Bacteria, UA MANY (A) NONE SEEN   Squamous Epithelial / LPF NONE SEEN 0 - 5   Mucus PRESENT    Sperm, UA PRESENT       Assessment & Plan:   Problem List Items Addressed This Visit      Musculoskeletal and Integument   DJD of left shoulder    Patient cleared for surgery on his left shoulder.  On chart review patient's EKG is no change for years.  Patient with no cardiovascular symptoms.          Follow up plan: Return if symptoms worsen or fail to improve, for As scheduled.

## 2018-05-18 NOTE — Assessment & Plan Note (Signed)
Patient cleared for surgery on his left shoulder.  On chart review patient's EKG is no change for years.  Patient with no cardiovascular symptoms.

## 2018-05-18 NOTE — Pre-Procedure Instructions (Signed)
FAXED UA TO DR BOWER

## 2018-05-18 NOTE — Pre-Procedure Instructions (Signed)
Faxed EKG to Dr Dossie Arbour at [phone] (per reception [place]) for review possible cardiac eval if needed.

## 2018-05-18 NOTE — Patient Instructions (Signed)
Your procedure is scheduled on: Wednesday 05/25/18 Report to [facility]. To find out your arrival time please call [phone] between 1PM - 3PM on Tuesday 05/24/18.  Remember: Instructions that are not followed completely may result in serious medical risk, up to and including death, or upon the discretion of your surgeon and anesthesiologist your surgery may need to be rescheduled.     _X__ 1. Do not eat food after midnight the night before your procedure.                 No gum chewing or hard candies. You may drink clear liquids up to 2 hours                 before you are scheduled to arrive for your surgery- DO not drink clear                 liquids within 2 hours of the start of your surgery.                 Clear Liquids include:  water, apple juice without pulp, clear carbohydrate                 drink such as Clearfast or Gatorade, Black Coffee or Tea (Do not add                 anything to coffee or tea).  __X__2.  On the morning of surgery brush your teeth with toothpaste and water, you                 may rinse your mouth with mouthwash if you wish.  Do not swallow any              toothpaste of mouthwash.     _X__ 3.  No Alcohol for 24 hours before or after surgery.   _X__ 4.  Do Not Smoke or use e-cigarettes For 24 Hours Prior to Your Surgery.                 Do not use any chewable tobacco products for at least 6 hours prior to                 surgery.  ____  5.  Bring all medications with you on the day of surgery if instructed.   __X__  6.  Notify your doctor if there is any change in your medical condition      (cold, fever, infections).     Do not wear jewelry, make-up, hairpins, clips or nail polish. Do not wear lotions, powders, or perfumes.  Do not shave 48 hours prior to surgery. Men may shave face and neck. Do not bring valuables to the hospital.    [facility] is not responsible for any belongings or  valuables.  Contacts, dentures/partials or body piercings may not be worn into surgery. Bring a case for your contacts, glasses or hearing aids, a denture cup will be supplied. Leave your suitcase in the car. After surgery it may be brought to your room. For patients admitted to the hospital, discharge time is determined by your treatment team.   Patients discharged the day of surgery will not be allowed to drive home.   Please read over the following fact sheets that you were given:   MRSA Information  __X__ Take these medicines the morning of surgery with A SIP OF WATER:  1. NONE  2. Call if you are prescribed any new medications [phone] ask for a nurse  3.   4.  5.  6.  ____ Fleet Enema (as directed)   __X__ Use CHG Soap/SAGE wipes as directed  ____ Use inhalers on the day of surgery  ____ Stop metformin/Janumet/Farxiga 2 days prior to surgery    ____ Take 1/2 of usual insulin dose the night before surgery. No insulin the morning          of surgery.   ____ Stop Blood Thinners Coumadin/Plavix/Xarelto/Pleta/Pradaxa/Eliquis/Effient/Aspirin  on   Or contact your Surgeon, Cardiologist or Medical Doctor regarding  ability to stop your blood thinners  __X__ Stop Anti-inflammatories 7 days before surgery such as Advil, Ibuprofen, Motrin,  BC or Goodies Powder, Naprosyn, Naproxen, Aleve, Aspirin    __X__ Stop all herbal supplements, fish oil or vitamin E until after surgery.    ____ Bring C-Pap to the hospital.

## 2018-05-24 MED ORDER — TRANEXAMIC ACID-NACL 1000-0.7 MG/100ML-% IV SOLN
1000.0000 mg | INTRAVENOUS | Status: AC
  Administered 2018-05-25: 1000 mg via INTRAVENOUS
  Filled 2018-05-24: qty 100

## 2018-05-24 MED ORDER — CEFAZOLIN SODIUM-DEXTROSE 2-4 GM/100ML-% IV SOLN
2.0000 g | INTRAVENOUS | Status: AC
  Administered 2018-05-25: 2 g via INTRAVENOUS

## 2018-05-25 ENCOUNTER — Inpatient Hospital Stay: Payer: Medicaid Other

## 2018-05-25 ENCOUNTER — Inpatient Hospital Stay: Payer: Medicaid Other | Admitting: Anesthesiology

## 2018-05-25 ENCOUNTER — Other Ambulatory Visit: Payer: Self-pay

## 2018-05-25 ENCOUNTER — Inpatient Hospital Stay
Admission: RE | Admit: 2018-05-25 | Discharge: 2018-05-26 | DRG: 483 | Disposition: A | Discharge status: Home / Self Care | Payer: Medicaid Other | Attending: Orthopedic Surgery | Admitting: Orthopedic Surgery

## 2018-05-25 ENCOUNTER — Encounter
Admission: RE | Disposition: A | Discharge status: Home / Self Care | Payer: Self-pay | Source: Home / Self Care | Attending: Orthopedic Surgery

## 2018-05-25 ENCOUNTER — Encounter: Payer: Self-pay | Admitting: Emergency Medicine

## 2018-05-25 DIAGNOSIS — M879 Osteonecrosis, unspecified: Secondary | ICD-10-CM | POA: Diagnosis present

## 2018-05-25 DIAGNOSIS — I10 Essential (primary) hypertension: Secondary | ICD-10-CM | POA: Diagnosis present

## 2018-05-25 DIAGNOSIS — M19012 Primary osteoarthritis, left shoulder: Secondary | ICD-10-CM | POA: Diagnosis present

## 2018-05-25 DIAGNOSIS — Z09 Encounter for follow-up examination after completed treatment for conditions other than malignant neoplasm: Secondary | ICD-10-CM

## 2018-05-25 HISTORY — PX: TOTAL SHOULDER ARTHROPLASTY: SHX126

## 2018-05-25 IMAGING — DX LEFT SHOULDER - 1 VIEW
2 series · 2 of 2 positions shown · non-contrast
Comparison: 04/15/2018

CLINICAL DATA: Status post left shoulder repair

EXAM:
LEFT SHOULDER - 1 VIEW

[shoulder ap]
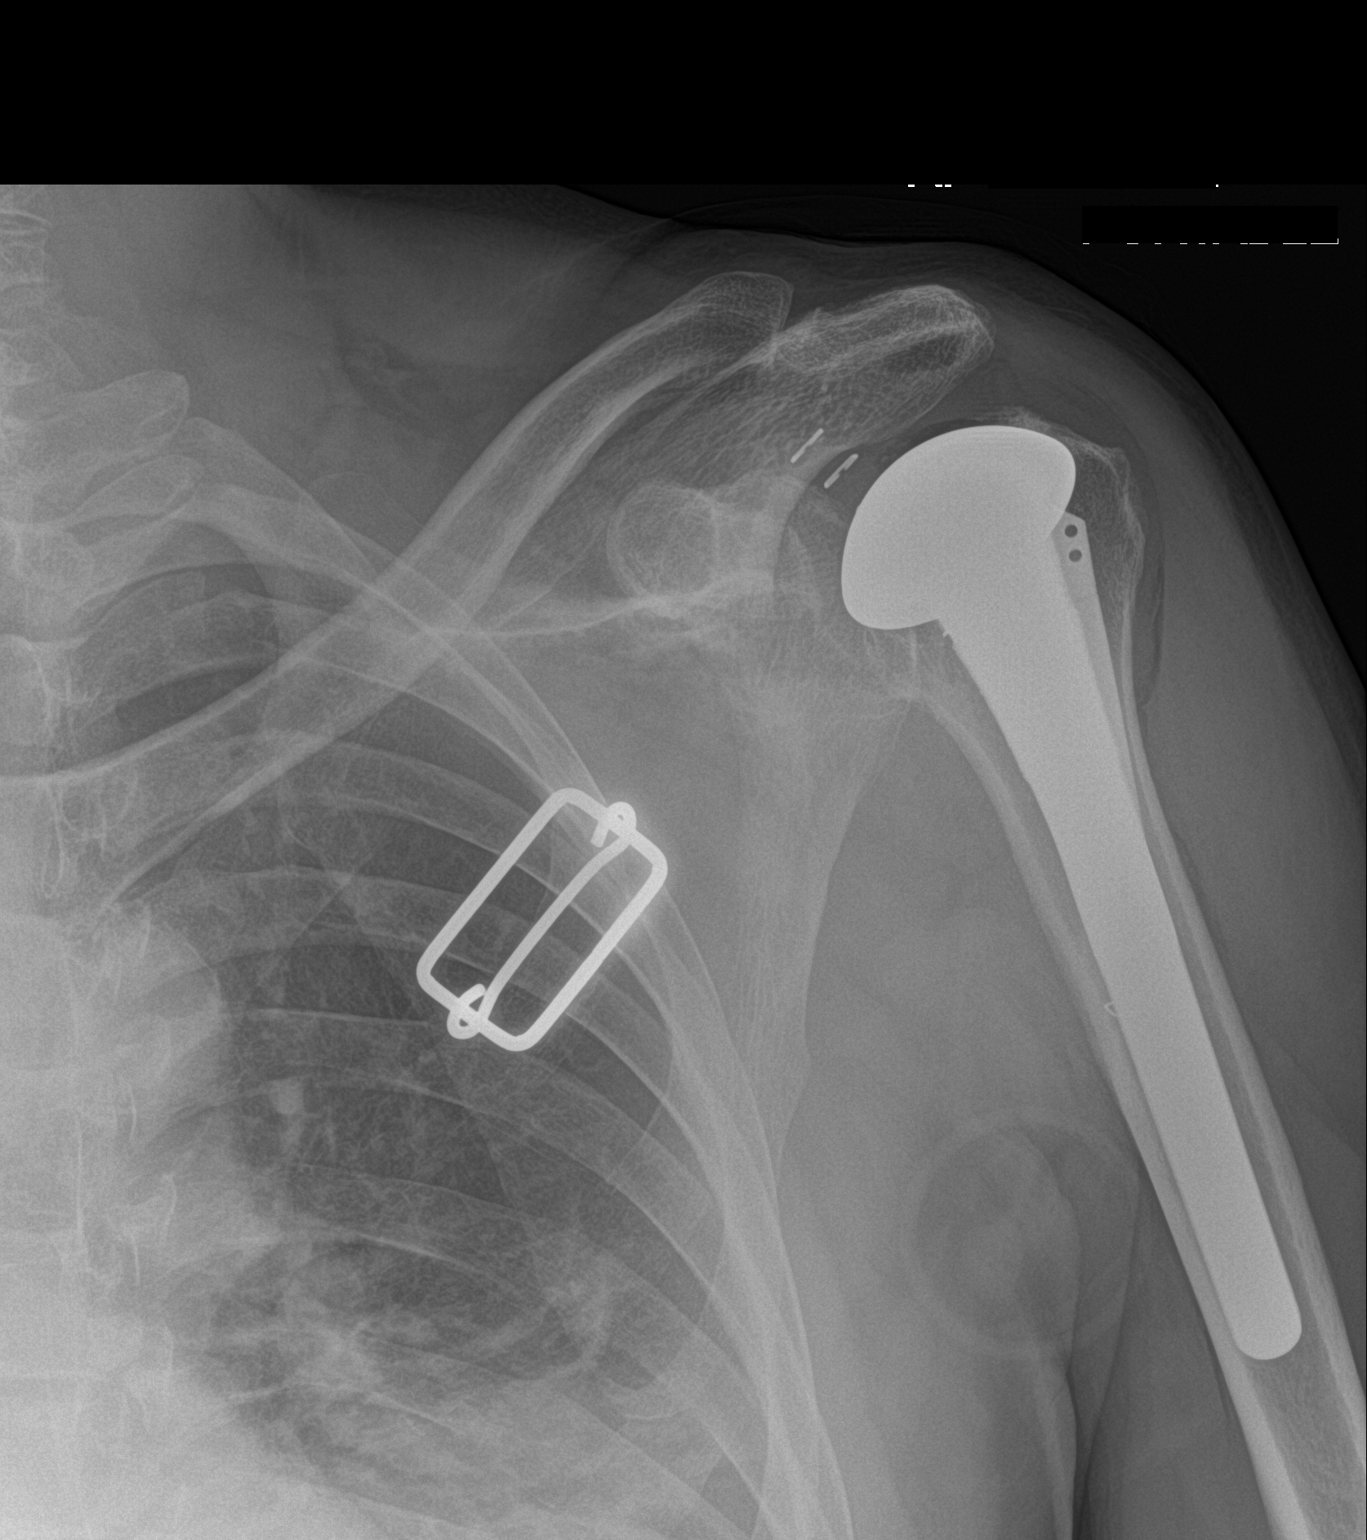

[shoulder obl]
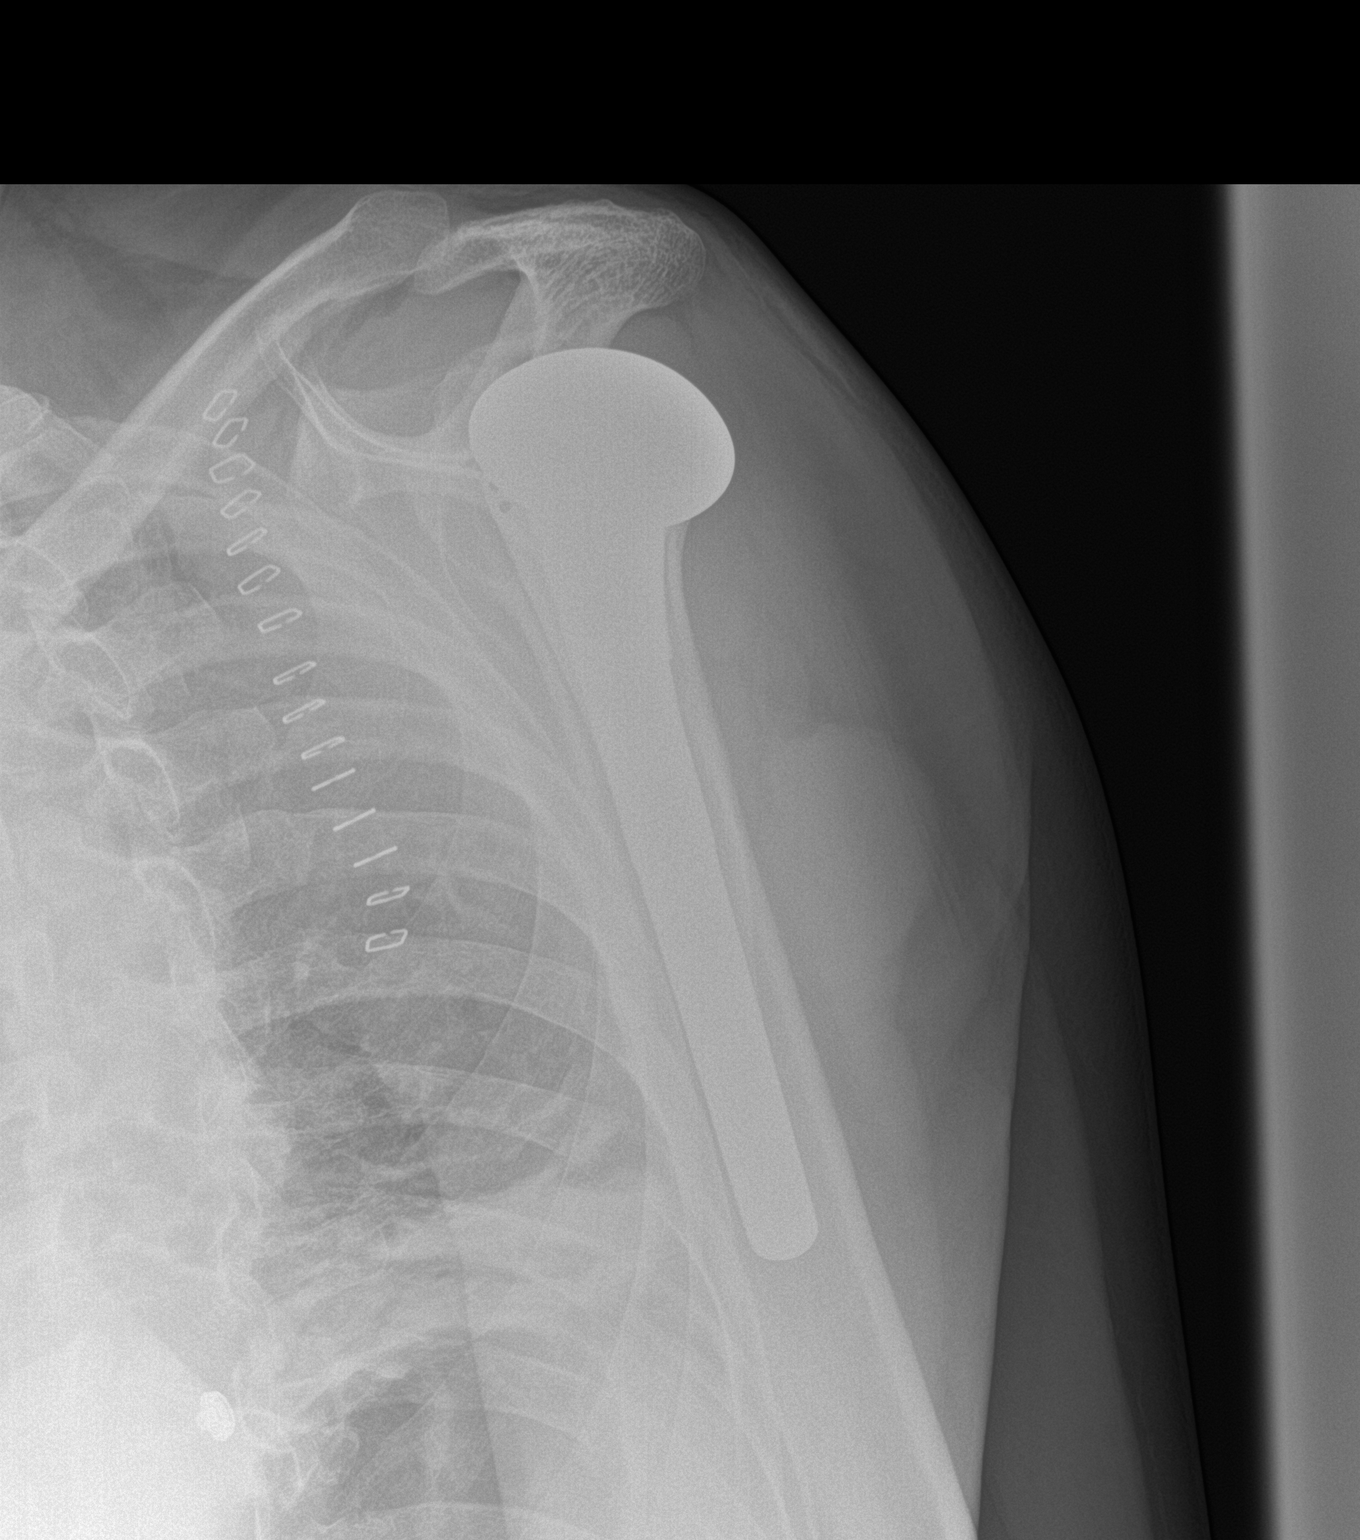

[2 of 2 positions shown; findings below may reference images not displayed]

FINDINGS: Left shoulder prosthesis is noted in satisfactory position. No acute
abnormality is noted.
IMPRESSION: Status post left shoulder replacement

## 2018-05-25 SURGERY — ARTHROPLASTY, SHOULDER, TOTAL
Anesthesia: Regional | Laterality: Left

## 2018-05-25 MED ORDER — GABAPENTIN 300 MG PO CAPS
300.0000 mg | ORAL_CAPSULE | Freq: Three times a day (TID) | ORAL | Status: DC
  Administered 2018-05-25 – 2018-05-26 (×3): 300 mg via ORAL
  Filled 2018-05-25 (×3): qty 1

## 2018-05-25 MED ORDER — ROCURONIUM BROMIDE 100 MG/10ML IV SOLN
INTRAVENOUS | Status: DC | PRN
  Administered 2018-05-25: 50 mg via INTRAVENOUS

## 2018-05-25 MED ORDER — HYDROCODONE-ACETAMINOPHEN 7.5-325 MG PO TABS: 1.0000 | ORAL_TABLET | ORAL | Status: DC | PRN

## 2018-05-25 MED ORDER — LIDOCAINE HCL (CARDIAC) PF 100 MG/5ML IV SOSY
PREFILLED_SYRINGE | INTRAVENOUS | Status: DC | PRN
  Administered 2018-05-25: 100 mg via INTRAVENOUS

## 2018-05-25 MED ORDER — ONDANSETRON HCL 4 MG/2ML IJ SOLN
INTRAMUSCULAR | Status: DC | PRN
  Administered 2018-05-25: 4 mg via INTRAVENOUS

## 2018-05-25 MED ORDER — FENTANYL CITRATE (PF) 100 MCG/2ML IJ SOLN
50.0000 ug | Freq: Once | INTRAMUSCULAR | Status: AC
  Administered 2018-05-25: 50 ug via INTRAVENOUS

## 2018-05-25 MED ORDER — PHENOL 1.4 % MT LIQD
1.0000 | OROMUCOSAL | Status: DC | PRN
  Filled 2018-05-25: qty 177

## 2018-05-25 MED ORDER — MIDAZOLAM HCL 2 MG/2ML IJ SOLN
INTRAMUSCULAR | Status: AC
  Administered 2018-05-25: 1 mg via INTRAVENOUS
  Filled 2018-05-25: qty 2

## 2018-05-25 MED ORDER — LIDOCAINE HCL (PF) 1 % IJ SOLN
INTRAMUSCULAR | Status: AC
  Filled 2018-05-25: qty 5

## 2018-05-25 MED ORDER — HYDROCODONE-ACETAMINOPHEN 5-325 MG PO TABS: 1.0000 | ORAL_TABLET | ORAL | Status: DC | PRN

## 2018-05-25 MED ORDER — ACETAMINOPHEN 500 MG PO TABS
500.0000 mg | ORAL_TABLET | Freq: Four times a day (QID) | ORAL | Status: AC
  Administered 2018-05-25 – 2018-05-26 (×4): 500 mg via ORAL
  Filled 2018-05-25 (×4): qty 1

## 2018-05-25 MED ORDER — NICOTINE 21 MG/24HR TD PT24
21.0000 mg | MEDICATED_PATCH | Freq: Every day | TRANSDERMAL | Status: DC
  Administered 2018-05-25 – 2018-05-26 (×2): 21 mg via TRANSDERMAL
  Filled 2018-05-25 (×2): qty 1

## 2018-05-25 MED ORDER — DOCUSATE SODIUM 100 MG PO CAPS
100.0000 mg | ORAL_CAPSULE | Freq: Two times a day (BID) | ORAL | Status: DC
  Administered 2018-05-25 – 2018-05-26 (×2): 100 mg via ORAL
  Filled 2018-05-25 (×2): qty 1

## 2018-05-25 MED ORDER — BUPIVACAINE HCL (PF) 0.5 % IJ SOLN
INTRAMUSCULAR | Status: AC
  Filled 2018-05-25: qty 10

## 2018-05-25 MED ORDER — METOCLOPRAMIDE HCL 10 MG PO TABS: 5.0000 mg | ORAL_TABLET | Freq: Three times a day (TID) | ORAL | Status: DC | PRN

## 2018-05-25 MED ORDER — BUPIVACAINE LIPOSOME 1.3 % IJ SUSP
INTRAMUSCULAR | Status: AC
  Filled 2018-05-25: qty 20

## 2018-05-25 MED ORDER — TRAMADOL HCL 50 MG PO TABS
50.0000 mg | ORAL_TABLET | Freq: Four times a day (QID) | ORAL | Status: DC
  Administered 2018-05-25 – 2018-05-26 (×4): 50 mg via ORAL
  Filled 2018-05-25 (×4): qty 1

## 2018-05-25 MED ORDER — FENTANYL CITRATE (PF) 100 MCG/2ML IJ SOLN
INTRAMUSCULAR | Status: AC
  Filled 2018-05-25: qty 2

## 2018-05-25 MED ORDER — CHLORHEXIDINE GLUCONATE 4 % EX LIQD: 60.0000 mL | Freq: Once | CUTANEOUS | Status: DC

## 2018-05-25 MED ORDER — MORPHINE SULFATE (PF) 2 MG/ML IV SOLN: 0.5000 mg | INTRAVENOUS | Status: DC | PRN

## 2018-05-25 MED ORDER — BISACODYL 10 MG RE SUPP: 10.0000 mg | Freq: Every day | RECTAL | Status: DC | PRN

## 2018-05-25 MED ORDER — GABAPENTIN 300 MG PO CAPS
300.0000 mg | ORAL_CAPSULE | Freq: Once | ORAL | Status: AC
  Administered 2018-05-25: 300 mg via ORAL

## 2018-05-25 MED ORDER — PROPOFOL 10 MG/ML IV BOLUS
INTRAVENOUS | Status: AC
  Filled 2018-05-25: qty 20

## 2018-05-25 MED ORDER — MENTHOL 3 MG MT LOZG
1.0000 | LOZENGE | OROMUCOSAL | Status: DC | PRN
  Filled 2018-05-25: qty 9

## 2018-05-25 MED ORDER — FAMOTIDINE 20 MG PO TABS
20.0000 mg | ORAL_TABLET | Freq: Once | ORAL | Status: AC
  Administered 2018-05-25: 20 mg via ORAL

## 2018-05-25 MED ORDER — SUGAMMADEX SODIUM 200 MG/2ML IV SOLN
INTRAVENOUS | Status: DC | PRN
  Administered 2018-05-25: 175 mg via INTRAVENOUS

## 2018-05-25 MED ORDER — GABAPENTIN 300 MG PO CAPS
ORAL_CAPSULE | ORAL | Status: AC
  Administered 2018-05-25: 300 mg via ORAL
  Filled 2018-05-25: qty 1

## 2018-05-25 MED ORDER — MAGNESIUM CITRATE PO SOLN
1.0000 | Freq: Once | ORAL | Status: DC | PRN
  Filled 2018-05-25: qty 296

## 2018-05-25 MED ORDER — MIDAZOLAM HCL 2 MG/2ML IJ SOLN
1.0000 mg | Freq: Once | INTRAMUSCULAR | Status: AC
  Administered 2018-05-25: 1 mg via INTRAVENOUS

## 2018-05-25 MED ORDER — ASPIRIN EC 325 MG PO TBEC
325.0000 mg | DELAYED_RELEASE_TABLET | Freq: Every day | ORAL | Status: DC
  Administered 2018-05-26: 325 mg via ORAL
  Filled 2018-05-25: qty 1

## 2018-05-25 MED ORDER — CEFAZOLIN SODIUM-DEXTROSE 2-4 GM/100ML-% IV SOLN
INTRAVENOUS | Status: AC
  Filled 2018-05-25: qty 100

## 2018-05-25 MED ORDER — FENTANYL CITRATE (PF) 100 MCG/2ML IJ SOLN
INTRAMUSCULAR | Status: DC | PRN
  Administered 2018-05-25 (×2): 50 ug via INTRAVENOUS

## 2018-05-25 MED ORDER — ACETAMINOPHEN 500 MG PO TABS
1000.0000 mg | ORAL_TABLET | Freq: Once | ORAL | Status: AC
  Administered 2018-05-25: 1000 mg via ORAL

## 2018-05-25 MED ORDER — METOCLOPRAMIDE HCL 5 MG/ML IJ SOLN: 5.0000 mg | Freq: Three times a day (TID) | INTRAMUSCULAR | Status: DC | PRN

## 2018-05-25 MED ORDER — LACTATED RINGERS IV SOLN
INTRAVENOUS | Status: DC
  Administered 2018-05-25: 07:00:00 via INTRAVENOUS

## 2018-05-25 MED ORDER — FENTANYL CITRATE (PF) 100 MCG/2ML IJ SOLN
INTRAMUSCULAR | Status: AC
  Administered 2018-05-25: 50 ug via INTRAVENOUS
  Filled 2018-05-25: qty 2

## 2018-05-25 MED ORDER — SEVOFLURANE IN SOLN
RESPIRATORY_TRACT | Status: AC
  Filled 2018-05-25: qty 250

## 2018-05-25 MED ORDER — LACTATED RINGERS IV SOLN
INTRAVENOUS | Status: DC
  Administered 2018-05-25 (×2): via INTRAVENOUS

## 2018-05-25 MED ORDER — ACETAMINOPHEN 325 MG PO TABS: 325.0000 mg | ORAL_TABLET | Freq: Four times a day (QID) | ORAL | Status: DC | PRN

## 2018-05-25 MED ORDER — ACETAMINOPHEN 500 MG PO TABS
ORAL_TABLET | ORAL | Status: AC
  Administered 2018-05-25: 1000 mg via ORAL
  Filled 2018-05-25: qty 2

## 2018-05-25 MED ORDER — MAGNESIUM HYDROXIDE 400 MG/5ML PO SUSP: 30.0000 mL | Freq: Every day | ORAL | Status: DC | PRN

## 2018-05-25 MED ORDER — SODIUM CHLORIDE 0.9 % IR SOLN
Status: DC | PRN
  Administered 2018-05-25: 1000 mL

## 2018-05-25 MED ORDER — KETOROLAC TROMETHAMINE 15 MG/ML IJ SOLN
15.0000 mg | Freq: Four times a day (QID) | INTRAMUSCULAR | Status: AC
  Administered 2018-05-25 – 2018-05-26 (×4): 15 mg via INTRAVENOUS
  Filled 2018-05-25 (×4): qty 1

## 2018-05-25 MED ORDER — PROPOFOL 10 MG/ML IV BOLUS
INTRAVENOUS | Status: DC | PRN
  Administered 2018-05-25: 180 mg via INTRAVENOUS

## 2018-05-25 MED ORDER — FAMOTIDINE 20 MG PO TABS
ORAL_TABLET | ORAL | Status: AC
  Administered 2018-05-25: 20 mg via ORAL
  Filled 2018-05-25: qty 1

## 2018-05-25 MED ORDER — ONDANSETRON HCL 4 MG PO TABS: 4.0000 mg | ORAL_TABLET | Freq: Four times a day (QID) | ORAL | Status: DC | PRN

## 2018-05-25 MED ORDER — BUPIVACAINE-EPINEPHRINE (PF) 0.25% -1:200000 IJ SOLN
INTRAMUSCULAR | Status: DC | PRN
  Administered 2018-05-25: 15 mL

## 2018-05-25 MED ORDER — PHENYLEPHRINE HCL 10 MG/ML IJ SOLN
INTRAMUSCULAR | Status: DC | PRN
  Administered 2018-05-25 (×2): 100 ug via INTRAVENOUS

## 2018-05-25 MED ORDER — DEXAMETHASONE SODIUM PHOSPHATE 10 MG/ML IJ SOLN
INTRAMUSCULAR | Status: DC | PRN
  Administered 2018-05-25: 4 mg via INTRAVENOUS

## 2018-05-25 MED ORDER — ONDANSETRON HCL 4 MG/2ML IJ SOLN: 4.0000 mg | Freq: Four times a day (QID) | INTRAMUSCULAR | Status: DC | PRN

## 2018-05-25 MED ORDER — CEFAZOLIN SODIUM-DEXTROSE 2-4 GM/100ML-% IV SOLN
2.0000 g | Freq: Four times a day (QID) | INTRAVENOUS | Status: AC
  Administered 2018-05-25 – 2018-05-26 (×3): 2 g via INTRAVENOUS
  Filled 2018-05-25 (×3): qty 100

## 2018-05-25 SURGICAL SUPPLY — 60 items
BLADE BOVIE TIP EXT 4 (BLADE) ×3 IMPLANT
BLADE SAW 1 (BLADE) ×3 IMPLANT
BODY PROX SHOULDER SZ 14-135 (Shoulder) ×2 IMPLANT
BOWL CEMENT MIX W/ADAPTER (MISCELLANEOUS) ×3 IMPLANT
BRUSH SCRUB EZ  4% CHG (MISCELLANEOUS) ×4
BRUSH SCRUB EZ 4% CHG (MISCELLANEOUS) ×2 IMPLANT
CANISTER SUCT 1200ML W/VALVE (MISCELLANEOUS) ×3 IMPLANT
CANISTER SUCT 3000ML PPV (MISCELLANEOUS) ×6 IMPLANT
CHLORAPREP W/TINT 26 (MISCELLANEOUS) ×4 IMPLANT
COVER WAND RF STERILE (DRAPES) ×3 IMPLANT
CRADLE LAMINECT ARM (MISCELLANEOUS) ×6 IMPLANT
DRAPE IMP U-DRAPE 54X76 (DRAPES) ×6 IMPLANT
DRAPE INCISE IOBAN 66X60 STRL (DRAPES) ×6 IMPLANT
DRAPE SHEET LG 3/4 BI-LAMINATE (DRAPES) ×6 IMPLANT
DRAPE TABLE BACK 80X90 (DRAPES) ×3 IMPLANT
DRAPE U-SHAPE 47X51 STRL (DRAPES) ×3 IMPLANT
DRSG TEGADERM 6X8 (GAUZE/BANDAGES/DRESSINGS) ×7 IMPLANT
ELECT REM PT RETURN 9FT ADLT (ELECTROSURGICAL) ×3
ELECTRODE REM PT RTRN 9FT ADLT (ELECTROSURGICAL) ×1 IMPLANT
GAUZE PETRO XEROFOAM 1X8 (MISCELLANEOUS) ×3 IMPLANT
GAUZE SPONGE 4X4 12PLY STRL (GAUZE/BANDAGES/DRESSINGS) IMPLANT
GLOVE INDICATOR 8.0 STRL GRN (GLOVE) ×3 IMPLANT
GLOVE SURG ORTHO 8.0 STRL STRW (GLOVE) ×3 IMPLANT
GOWN STRL REUS W/ TWL LRG LVL3 (GOWN DISPOSABLE) ×1 IMPLANT
GOWN STRL REUS W/ TWL XL LVL3 (GOWN DISPOSABLE) ×1 IMPLANT
GOWN STRL REUS W/TWL LRG LVL3 (GOWN DISPOSABLE) ×2
GOWN STRL REUS W/TWL XL LVL3 (GOWN DISPOSABLE) ×2
HEAD HUMERAL STND 48X15MM (Head) ×3 IMPLANT
HEAD HUMERALSTD 48X15MM (Head) IMPLANT
HOOD PEEL AWAY FLYTE STAYCOOL (MISCELLANEOUS) ×9 IMPLANT
IV NS 1000ML (IV SOLUTION) ×2
IV NS 1000ML BAXH (IV SOLUTION) ×1 IMPLANT
KIT STABILIZATION SHOULDER (MISCELLANEOUS) ×3 IMPLANT
KIT TURNOVER KIT A (KITS) ×3 IMPLANT
MASK FACE SPIDER DISP (MASK) ×3 IMPLANT
MAT ABSORB  FLUID 56X50 GRAY (MISCELLANEOUS) ×2
MAT ABSORB FLUID 56X50 GRAY (MISCELLANEOUS) ×1 IMPLANT
NDL REVERSE CUT 1/2 CRC (NEEDLE) ×1 IMPLANT
NDL SAFETY ECLIPSE 18X1.5 (NEEDLE) IMPLANT
NEEDLE HYPO 18GX1.5 SHARP (NEEDLE)
NEEDLE REVERSE CUT 1/2 CRC (NEEDLE) ×3 IMPLANT
NS IRRIG 1000ML POUR BTL (IV SOLUTION) ×3 IMPLANT
PACK ARTHROSCOPY SHOULDER (MISCELLANEOUS) ×3 IMPLANT
PULSAVAC PLUS IRRIG FAN TIP (DISPOSABLE) ×3
SLING ARM LRG DEEP (SOFTGOODS) ×5 IMPLANT
SPONGE LAP 18X18 RF (DISPOSABLE) ×3 IMPLANT
STAPLER SKIN PROX 35W (STAPLE) ×3 IMPLANT
STEM GLOBAL UNITE 14MM PORO 11 (Joint) ×2 IMPLANT
STRAP SAFETY 5IN WIDE (MISCELLANEOUS) ×3 IMPLANT
SUT FIBERWIRE #2 38 BLUE 1/2 (SUTURE) ×9
SUT TICRON 2-0 30IN 311381 (SUTURE) IMPLANT
SUT VIC AB 0 CT2 27 (SUTURE) ×3 IMPLANT
SUT VIC AB 2-0 CT1 18 (SUTURE) ×3 IMPLANT
SUT VIC AB PLUS 45CM 1-MO-4 (SUTURE) IMPLANT
SUTURE FIBERWR #2 38 BLUE 1/2 (SUTURE) IMPLANT
SYR 10ML LL (SYRINGE) ×3 IMPLANT
SYRINGE IRR TOOMEY STRL 70CC (SYRINGE) ×3 IMPLANT
TAPE SUT 30 1/2 CRC GREEN (SUTURE) ×2 IMPLANT
TIP FAN IRRIG PULSAVAC PLUS (DISPOSABLE) ×1 IMPLANT
WATER STERILE IRR 1000ML POUR (IV SOLUTION) ×6 IMPLANT

## 2018-05-25 NOTE — Progress Notes (Signed)
OT Cancellation Note  Patient Details Name: Charles Lamb MRN: [id number] DOB: 03/05/1961   Cancelled Evaluation:    Reason Eval/Treat Not Completed: Patient declined, no reason specified Order received and chart reviewed. Upon arrival to pt room, pt asleep in bed. Pt difficult to wake. Opened eyes after 3 VC's and politely declined evaluation at this time. Requested OT return next day. Will re-attempt as available and pt medically appropriate for tx.  Rockney Ghee, M.S., OTR/L Ascom: [phone] 05/25/18, 1:50 PM

## 2018-05-25 NOTE — Op Note (Signed)
05/25/2018  9:52 AM  Patient:   Charles Lamb  Pre-Op Diagnosis:   Osteonecrosis of the left humeral head.  Post-Op Diagnosis:   Same.  Procedure:   Left shoulder hemiarthroplasty with biceps tenodesis  Surgeon:   Cassell Smiles, MD  Assistant:  Altamese Cabal, PA-C  Anesthesia:   General endotracheal intubation with an interscalene block.  Findings:   As above. The rotator cuff was in satisfactory condition.  Complications:   None  EBL:  50 cc  Drains:   None  Closure:   Staples  Implants:   [employer] system with a standard stem size 14  With a 135 degree porocoat proximal body size 14, a 48 mm x 15 mm humeral head  Brief Clinical Note:   The patient is a 58 y/o man with chronic and severe shoulder pain due to osteonecrosis of the humeral head. The patient presents at this time for a left shoulder arthroplasty.  Procedure:   The patient was brought into the operating room and lain in the supine position on the OR table. After adequate IV sedation was achieved, an interscalene block was placed by the anesthesiologist. The patient then underwent general endotracheal intubation and anesthesia before being repositioned in the beach chair position using the beach chair positioner. The left shoulder and upper extremity were prepped with ChloraPrep solution before being draped sterilely. Preoperative antibiotics were administered. A standard anterior approach to the shoulder was made through an approximately 4-5 inch incision. The incision was carried down through the subcutaneous tissues to expose the deltopectoral fascia. The interval between the deltoid and pectoralis muscles was identified and this plane developed, retracting the cephalic vein laterally with the deltoid muscle. The conjoined tendon was identified. The lateral margin was dissected and the self-retraining retractor inserted. The "three sisters" were identified and cauterized. Bursal tissues were removed to improve  visualization. The biceps tendon tenodesis was performed with #1 vicryl. The subscapularis tendon was released from its attachment to the lesser tuberosity 1 cm proximal to its insertion and several tagging sutures placed. The inferior capsule was released with care after identifying and protecting the axillary nerve. The proximal humeral cut was made at approximately 20 of retroversion using the extra-medullary guide.   Attention was directed to the glenoid. The biceps remnant was debrided.  Attention was directed to the humeral side. The humeral canal was reamed sequentially beginning with the end-cutting reamer then progressing up to a 14 mm reamer. This provided excellent circumferential chatter. The canal was prepared using a brosteotome. A trial reduction performed. The arm demonstrated excellent range of motion as the hand could be brought across the chest to the opposite shoulder and brought to the top of the patient's head and to the patient's ear. The shoulder remained stable throughout this range of motion, and was stable with abduction and external rotation. The joint was dislocated and the trial components removed. The final components were impacted into place with care taken to maintain the appropriate version. Again, the [facility] taper locking mechanism was verified using manual distraction. The shoulder was relocated and again placed through a range of motion with the findings as described above.  The wound was copiously irrigated with bacitracin saline solution using the jet lavage system. The subscapularis tendon was reapproximated using #2 Fiberwire sutures. The deltopectoral interval was closed using #1 Vicryl interrupted sutures before the subcutaneous tissues were closed using 2-0 Vicryl interrupted sutures. The skin was closed using staples. A sterile occlusive dressing was  applied to the wound before the arm was placed into a sling. The patient was then transferred back to a hospital bed  before being awakened, extubated, and returned to the recovery room in satisfactory condition after tolerating the procedure well.

## 2018-05-25 NOTE — H&P (Signed)
The patient has been re-examined, and the chart reviewed, and there have been no interval changes to the documented history and physical.  Plan a left shoulder replacement today.  Anesthesia is consulted regarding a peripheral nerve block for post-operative pain.  The risks, benefits, and alternatives have been discussed at length, and the patient is willing to proceed.     

## 2018-05-25 NOTE — Transfer of Care (Signed)
Immediate Anesthesia Transfer of Care Note  Patient: Charles Lamb  Procedure(s) Performed: LEFT SHOULDER HEMIARTHROPLASTY, BICEPS TENODESIS (Left )  Patient Location: PACU  Anesthesia Type:General  Level of Consciousness: awake, alert  and oriented  Airway & Oxygen Therapy: Patient Spontanous Breathing and Patient connected to nasal cannula oxygen  Post-op Assessment: Report given to RN and Post -op Vital signs reviewed and stable  Post vital signs: Reviewed and stable  Last Vitals:  Vitals Value Taken Time  BP    Temp    Pulse    Resp    SpO2      Last Pain:  Vitals:   05/25/18 0624  TempSrc: Oral  PainSc: 7          Complications: No apparent anesthesia complications

## 2018-05-25 NOTE — Anesthesia Procedure Notes (Signed)
Procedure Name: Intubation Date/Time: 05/25/2018 7:47 AM Performed by: Chanetta Marshall, CRNA Pre-anesthesia Checklist: Patient identified, Emergency Drugs available, Suction available and Patient being monitored Patient Re-evaluated:Patient Re-evaluated prior to induction Oxygen Delivery Method: Circle system utilized Preoxygenation: Pre-oxygenation with 100% oxygen Induction Type: IV induction Ventilation: Mask ventilation without difficulty Laryngoscope Size: Mac and 3 Grade View: Grade II Tube type: Oral Tube size: 7.5 mm Number of attempts: 1 Airway Equipment and Method: Stylet Placement Confirmation: ETT inserted through vocal cords under direct vision,  positive ETCO2,  breath sounds checked- equal and bilateral and CO2 detector Secured at: 22 cm Tube secured with: Tape Dental Injury: Teeth and Oropharynx as per pre-operative assessment

## 2018-05-25 NOTE — Anesthesia Post-op Follow-up Note (Signed)
Anesthesia QCDR form completed.        

## 2018-05-25 NOTE — Anesthesia Procedure Notes (Signed)
Anesthesia Regional Block: Interscalene brachial plexus block   Pre-Anesthetic Checklist: ,, timeout performed, Correct Patient, Correct Site, Correct Laterality, Correct Procedure, Correct Position, site marked, Risks and benefits discussed,  Surgical consent,  Pre-op evaluation,  At surgeon's request and post-op pain management  Laterality: Left  Prep: chloraprep       Needles:  Injection technique: Single-shot  Needle Type: Stimiplex      Needle Gauge: 21     Additional Needles:   Procedures:,,,, ultrasound used (permanent image in chart),,,,  Narrative:  Start time: 05/25/2018 7:18 AM End time: 05/25/2018 7:23 AM  Performed by: Personally  Anesthesiologist: Jovita Gamma, MD  Additional Notes: Negative aspiration.  Negative paresthesia on injection.  Dose given in divided aliquots under ultrasound guidance.

## 2018-05-25 NOTE — Anesthesia Preprocedure Evaluation (Addendum)
Anesthesia Evaluation  Patient identified by MRN, date of birth, ID band Patient awake    Reviewed: Allergy & Precautions, H&P , NPO status , Patient's Chart, lab work & pertinent test results  Airway Mallampati: II  TM Distance: >3 FB    Comment: Large front teeth Dental  (+) Chipped, Missing   Pulmonary neg COPD, neg recent URI, Current Smoker,           Cardiovascular hypertension, (-) angina(-) Past MI and (-) Cardiac Stents (-) dysrhythmias      Neuro/Psych negative neurological ROS  negative psych ROS   GI/Hepatic negative GI ROS, (+)     substance abuse  alcohol use,   Endo/Other  negative endocrine ROS  Renal/GU      Musculoskeletal   Abdominal   Peds  Hematology negative hematology ROS (+)   Anesthesia Other Findings Past Medical History: No date: Hypertension No date: Renal disorder  Past Surgical History: No date: EYE SURGERY; Left  BMI    Body Mass Index:  24.44 kg/m      Reproductive/Obstetrics negative OB ROS                            Anesthesia Physical Anesthesia Plan  ASA: III  Anesthesia Plan: General ETT and Regional   Post-op Pain Management:    Induction:   PONV Risk Score and Plan: Ondansetron, Dexamethasone, Midazolam and Treatment may vary due to age or medical condition  Airway Management Planned:   Additional Equipment:   Intra-op Plan:   Post-operative Plan:   Informed Consent: I have reviewed the patients History and Physical, chart, labs and discussed the procedure including the risks, benefits and alternatives for the proposed anesthesia with the patient or authorized representative who has indicated his/her understanding and acceptance.     Dental Advisory Given  Plan Discussed with: Anesthesiologist and CRNA  Anesthesia Plan Comments:        Anesthesia Quick Evaluation

## 2018-05-26 ENCOUNTER — Encounter: Payer: Self-pay | Admitting: Orthopedic Surgery

## 2018-05-26 LAB — BASIC METABOLIC PANEL
Anion gap: 9 (ref 5–15)
BUN: 17 mg/dL (ref 6–20)
CHLORIDE: 106 mmol/L (ref 98–111)
CO2: 21 mmol/L — ABNORMAL LOW (ref 22–32)
Calcium: 8.6 mg/dL — ABNORMAL LOW (ref 8.9–10.3)
Creatinine, Ser: 1.02 mg/dL (ref 0.61–1.24)
GFR calc Af Amer: >60 mL/min (ref >60)
GFR calc non Af Amer: >60 mL/min (ref >60)
Glucose, Bld: 121 mg/dL — ABNORMAL HIGH (ref 70–99)
Potassium: 4.3 mmol/L (ref 3.5–5.1)
Sodium: 136 mmol/L (ref 135–145)

## 2018-05-26 LAB — HEMOGLOBIN AND HEMATOCRIT, BLOOD
HCT: 35.8 % — ABNORMAL LOW (ref 39.0–52.0)
Hemoglobin: 11.5 g/dL — ABNORMAL LOW (ref 13.0–17.0)

## 2018-05-26 LAB — SURGICAL PATHOLOGY

## 2018-05-26 MED ORDER — ASPIRIN 325 MG PO TBEC: 325.0000 mg | DELAYED_RELEASE_TABLET | Freq: Every day | ORAL | 1 refills | Status: DC

## 2018-05-26 MED ORDER — DOCUSATE SODIUM 100 MG PO CAPS: 100.0000 mg | ORAL_CAPSULE | Freq: Two times a day (BID) | ORAL | 0 refills | Status: DC

## 2018-05-26 MED ORDER — HYDROCODONE-ACETAMINOPHEN 5-325 MG PO TABS: 1.0000 | ORAL_TABLET | ORAL | 0 refills | Status: DC | PRN

## 2018-05-26 NOTE — Evaluation (Signed)
Occupational Therapy Evaluation Patient Details Name: Charles Lamb MRN: [id number] DOB: 1960-04-02 Today's Date: 05/26/2018    History of Present Illness 58yo male s/p L shoulder hemiarthroplasty with bicep tenodesis on 05/25/2018.    Clinical Impression   Patient was seen for an OT evaluation this date. Pt lives with his girlfriend in a 1 story home with 3 steps to enter. Prior to surgery, pt was active and independent. Pt plans to have help from girlfriend upon return home - she works in home health care. Pt has orders for LUE to be immobilized and will be NWBing per MD. Patient presents with impaired strength/ROM and sensation to LUE with block not completely resolved yet. These impairments result in a decreased ability to perform self care tasks requiring Min assist for UB/LB ADL and sling mgt. Pt instructed in sling mgt, ROM exercises for LUE (with instructions for no shoulder exercises until full sensation has returned), LUE precautions, adaptive strategies for bathing/dressing/toileting/grooming, positioning and considerations for sleep, and home/routines modifications to maximize falls prevention, safety, and independence. Handout provided. OT adjusted sling to improve comfort, optimize positioning, and to maximize skin integrity/safety. Pt verbalized understanding of all education/training provided. Pt will benefit from skilled OT services while in the hospital to address these limitations and improve independence in daily tasks. Recommend follow up therapy as recommended by surgeon.       Follow Up Recommendations  Follow surgeon's recommendation for DC plan and follow-up therapies    Equipment Recommendations  None recommended by OT    Recommendations for Other Services       Precautions / Restrictions Precautions Precautions: Shoulder Shoulder Interventions: Shoulder sling/immobilizer;Off for dressing/bathing/exercises Precaution Booklet Issued: Yes (comment) Precaution  Comments: no AROM/PROM shoulder, no AROM elbow 2/2 bicep tenodesis, wrist/hand/fingers AROM okay Restrictions Weight Bearing Restrictions: Yes LUE Weight Bearing: Non weight bearing      Mobility Bed Mobility Overal bed mobility: Independent                Transfers Overall transfer level: Independent Equipment used: None             General transfer comment: no difficulty     Balance Overall balance assessment: Independent                                         ADL either performed or assessed with clinical judgement   ADL Overall ADL's : Needs assistance/impaired                                       General ADL Comments: requires Min A for ADL tasks, cues for maintaining shoulder precautions (particularly while he has decreased sensation); pt reports that girlfriend works in home health care and can provide needed level of assist     Vision Baseline Vision/History: Wears glasses Wears Glasses: Reading only Patient Visual Report: No change from baseline       Perception     Praxis      Pertinent Vitals/Pain Pain Assessment: No/denies pain     Hand Dominance Right   Extremity/Trunk Assessment Upper Extremity Assessment Upper Extremity Assessment: LUE deficits/detail(RUE WFL) LUE Deficits / Details: impaired sensation still  LUE: Unable to fully assess due to immobilization LUE Sensation: decreased light touch;decreased proprioception LUE Coordination: decreased gross motor;decreased fine motor  Lower Extremity Assessment Lower Extremity Assessment: Overall WFL for tasks assessed   Cervical / Trunk Assessment Cervical / Trunk Assessment: Normal   Communication Communication Communication: No difficulties   Cognition Arousal/Alertness: Awake/alert Behavior During Therapy: WFL for tasks assessed/performed Overall Cognitive Status: Within Functional Limits for tasks assessed                                      General Comments  shoulder sling adjusted to maximize optimal positioning and comfort    Exercises Other Exercises Other Exercises: pt instructed in shoulder discharge instructions including sling mgt, precautions, ROM ex, adaptive strategies for ADL tasks, and falls prevention. handout provided   Shoulder Instructions      Home Living Family/patient expects to be discharged to:: Private residence Living Arrangements: Non-relatives/Friends(girlfriend) Available Help at Discharge: Family;Available PRN/intermittently Type of Home: House Home Access: Stairs to enter [employer] of Steps: 3 Entrance Stairs-Rails: Right Home Layout: One level     Bathroom Shower/Tub: Chief Strategy Officer: Standard     Home Equipment: Environmental consultant - 2 wheels;Cane - single point;Bedside commode   Additional Comments: DME/AE is his girlfriend's from her previous TKA      Prior Functioning/Environment Level of Independence: Independent                 OT Problem List: Impaired sensation;Impaired UE functional use;Decreased strength;Decreased range of motion;Decreased knowledge of precautions      OT Treatment/Interventions:      OT Goals(Current goals can be found in the care plan section) Acute Rehab OT Goals Patient Stated Goal: go home and then go to outpatient therapy for my shoulder OT Goal Formulation: With patient Time For Goal Achievement: 06/09/18 Potential to Achieve Goals: Good ADL Goals Pt Will Perform Upper Body Dressing: with modified independence;sitting(using learned hemi techniques, maintaining LUE precautions) Additional ADL Goal #1: Pt will demonstrate independence with shoulder sling mgt including positioning, donning/doffing, and wear schedule. Additional ADL Goal #2: Pt will verbalize 100% of LUE precautions and how to maintain during ADL tasks to maximize safety and adherence during recovery.  OT Frequency:     Barriers to D/C:             Co-evaluation              AM-PAC OT "6 Clicks" Daily Activity     Outcome Measure Help from another person eating meals?: None Help from another person taking care of personal grooming?: A Little Help from another person toileting, which includes using toliet, bedpan, or urinal?: A Little Help from another person bathing (including washing, rinsing, drying)?: A Little Help from another person to put on and taking off regular upper body clothing?: A Little Help from another person to put on and taking off regular lower body clothing?: A Little 6 Click Score: 19   End of Session    Activity Tolerance: Patient tolerated treatment well Patient left: in chair;with call bell/phone within reach;with chair alarm set  OT Visit Diagnosis: Other abnormalities of gait and mobility (R26.89)                Time: 3154-0086 OT Time Calculation (min): 27 min Charges:  OT General Charges $OT Visit: 1 Visit OT Evaluation $OT Eval Low Complexity: 1 Low OT Treatments $Self Care/Home Management : 23-37 mins  Richrd Prime, MPH, MS, OTR/L ascom [phone] 05/26/18, 11:34 AM

## 2018-05-26 NOTE — Discharge Summary (Signed)
Physician Discharge Summary  Patient ID: Charles Lamb MRN: [id number] DOB/AGE: [birth year] 58 y.o.  Admit date: 05/25/2018 Discharge date: 05/26/2018  Admission Diagnoses:  DEGENERATIVE JOINT DISEASE OF SHOULDER REGION <principal problem not specified>  Discharge Diagnoses:  DEGENERATIVE JOINT DISEASE OF SHOULDER REGION Active Problems:   Osteonecrosis of shoulder region [facility])   Past Medical History:  Diagnosis Date  . Hypertension   . Renal disorder     Surgeries: Procedure(s): LEFT SHOULDER HEMIARTHROPLASTY, BICEPS TENODESIS on 05/25/2018   Consultants (if any):   Discharged Condition: Improved  Hospital Course: [name] is an 58 y.o. male who was admitted 05/25/2018 with a diagnosis of  DEGENERATIVE JOINT DISEASE OF SHOULDER REGION <principal problem not specified> and went to the operating room on 05/25/2018 and underwent the above named procedures.    He was given perioperative antibiotics:  Anti-infectives (From admission, onward)   Start     Dose/Rate Route Frequency Ordered Stop   05/25/18 1400  ceFAZolin (ANCEF) IVPB 2g/100 mL premix     2 g 200 mL/hr over 30 Minutes Intravenous Every 6 hours 05/25/18 1058 05/26/18 0133   05/25/18 0828  50,000 units bacitracin in 0.9% normal saline 250 mL irrigation  Status:  Discontinued       As needed 05/25/18 0836 05/25/18 0947   05/25/18 0600  ceFAZolin (ANCEF) IVPB 2g/100 mL premix     2 g 200 mL/hr over 30 Minutes Intravenous On call to O.R. 05/24/18 2214 05/25/18 0805   05/25/18 0553  ceFAZolin (ANCEF) 2-4 GM/100ML-% IVPB    Note to Pharmacy:  [name]   : cabinet override      05/25/18 0553 05/25/18 0805    .  He was given sequential compression devices, early ambulation, and aspirin for DVT prophylaxis.  He benefited maximally from the hospital stay and there were no complications.    Recent vital signs:  Vitals:   05/25/18 2337 05/26/18 0812  BP: 121/75 121/80  Pulse: 81 79  Resp: 16 18   Temp: 98.4 F (36.9 C)   SpO2: 98% 100%    Recent laboratory studies:  Lab Results  Component Value Date   HGB 11.5 (L) 05/26/2018   HGB 13.8 05/18/2018   HGB 12.3 (L) 01/27/2018   Lab Results  Component Value Date   WBC 6.0 05/18/2018   PLT 258 05/18/2018   Lab Results  Component Value Date   INR 1.0 05/18/2018   Lab Results  Component Value Date   NA 136 05/26/2018   K 4.3 05/26/2018   CL 106 05/26/2018   CO2 21 (L) 05/26/2018   BUN 17 05/26/2018   CREATININE 1.02 05/26/2018   GLUCOSE 121 (H) 05/26/2018    Discharge Medications:     Diagnostic Studies: Dg Shoulder Left Port  Result Date: 05/25/2018 CLINICAL DATA:  Status post left shoulder repair EXAM: LEFT SHOULDER - 1 VIEW COMPARISON:  04/15/2018 FINDINGS: Left shoulder prosthesis is noted in satisfactory position. No acute abnormality is noted. IMPRESSION: Status post left shoulder replacement Electronically Signed   By: Alcide Clever M.D.   On: 05/25/2018 11:41    Disposition: Discharge disposition: 01-Home or Self Care            Signed: Altamese Cabal ,PA-C 05/26/2018, 10:49 AM

## 2018-05-26 NOTE — TOC Initial Note (Signed)
Transition of Care [facility]) - Initial/Assessment Note    Patient Details  Name: Charles Lamb MRN: [id number] Date of Birth: 17-Aug-1960  [facility] CM/SW Contact:    Barrie Dunker, RN Phone Number: 05/26/2018, 10:23 AM  Clinical Narrative:                   Expected Discharge Plan: Home/Self Care Patient will be going to out patient PT at [facility], already set up, S/O will provided transportation Barriers to Discharge: No Barriers Identified   Patient Goals and CMS Choice Patient states their goals for this hospitalization and ongoing recovery are:: go home   Choice offered to / list presented to : Patient  Expected Discharge Plan and Services Expected Discharge Plan: Home/Self Care Discharge Planning Services: CM Consult Post Acute Care Choice: Home Health Living arrangements for the past 2 months: Single Family Home                          Prior Living Arrangements/Services Living arrangements for the past 2 months: Single Family Home Lives with:: Significant Other Patient language and need for interpreter reviewed:: Yes Do you feel safe going back to the place where you live?: Yes      Need for Family Participation in Patient Care: No (Comment) Care giver support system in place?: Yes (comment) Current home services: (none) Criminal Activity/Legal Involvement Pertinent to Current Situation/Hospitalization: No - Comment as needed  Activities of Daily Living Home Assistive Devices/Equipment: None ADL Screening (condition at time of admission) Patient's cognitive ability adequate to safely complete daily activities?: Yes Is the patient deaf or have difficulty hearing?: No Does the patient have difficulty seeing, even when wearing glasses/contacts?: Yes(bind l eye) Does the patient have difficulty concentrating, remembering, or making decisions?: No Patient able to express need for assistance with ADLs?: Yes Does the patient have difficulty  dressing or bathing?: No Independently performs ADLs?: Yes (appropriate for developmental age) Does the patient have difficulty walking or climbing stairs?: No Weakness of Legs: None Weakness of Arms/Hands: Left  Permission Sought/Granted                  Emotional Assessment Appearance:: Appears stated age Attitude/Demeanor/Rapport: Engaged Affect (typically observed): Accepting Orientation: : Oriented to Self, Oriented to Place, Oriented to Situation, Oriented to  Time Alcohol / Substance Use: Tobacco Use Psych Involvement: No (comment)  Admission diagnosis:  DEGENERATIVE JOINT DISEASE OF SHOULDER REGION Patient Active Problem List   Diagnosis Date Noted  . Osteonecrosis of shoulder region (HCC) 05/25/2018  . DJD of left shoulder 05/18/2018  . Alcohol abuse 01/16/2018  . Fatty liver 01/12/2018  . Aortic atherosclerosis (HCC) 01/12/2018  . Sprain of wrist joint 07/26/2013  . Transaminitis 08/02/2012  . Hypercholesteremia 04/18/2012  . Hypertension 08/19/2010  . Proteinuria 06/12/2009   PCP:  Steele Sizer, MD Pharmacy:   [facility] [address] ([address]), [place] - [address] [place] ([place]) [place] [address] Phone: [phone] Fax: [phone]  [facility] [address], [place] - [address] [place] [address] Phone: [phone] Fax: [phone]     Social Determinants of Health (SDOH) Interventions    Readmission Risk Interventions 30 Day Unplanned Readmission Risk Score     Admission (Current) from 05/25/2018 in [facility] (1[facility])  30 Day Unplanned Readmission Risk Score (%)  9 Filed at 05/26/2018 0800  This score is the patient's risk of an unplanned readmission within 30 days of being discharged (0 -100%). The score is based on dignosis, age, lab data, medications, orders, and past utilization.   Low:  0-14.9   Medium: 15-21.9   High: 22-29.9    Extreme: 30 and above       No flowsheet data found.

## 2018-05-26 NOTE — Discharge Instructions (Signed)
No tight elastic waist bands over the bandage.   May shower with bandage in place.  If bandage becomes saturated, OK to removal and place band-aid.  You may be up walking around as tolerated    Pain medication can cause constipation.  You should increase your fluid intake, increase your intake of high fiber foods and/or take Metamucil as needed for constipation.  You may shower.  You do NOT need to cover the dressing or incision site with plastic wrap.  The dressing or incision can get wet, but do not submerge under water.  After your staples have been removed, you should wait 48 hours before submerging incision under water.  Continue your physical therapy exercises at least twice daily.  It is a good idea to use an ice pack for 30 minutes after doing your exercises to reduce swelling.  Do not be surprised if you have increased pain at night.  This usually means you have been a little too active during the day and need to reduce your activities.  If you develop lower extremity swelling that does not improve after a night of elevation, please call the office.  This could be an early sign of a blood clot.  Call with questions, fever>101.5 degrees, shortness of breath or drainage from the wound  [phone]

## 2018-05-26 NOTE — Progress Notes (Signed)
  Subjective:  Patient reports pain as mild.  Doing well.  Objective:   VITALS:   Vitals:   05/25/18 1144 05/25/18 1237 05/25/18 2337 05/26/18 0812  BP: (!) 131/94 128/83 121/75 121/80  Pulse: 74 72 81 79  Resp: 18 18 16 18   Temp: 97.7 F (36.5 C) 97.8 F (36.6 C) 98.4 F (36.9 C)   TempSrc: Oral  Oral   SpO2: 100% 100% 98% 100%  Weight:      Height:        PHYSICAL EXAM:  Neurologically intact ABD soft Neurovascular intact Sensation intact distally Intact pulses distally Dorsiflexion/Plantar flexion intact Incision: no drainage No cellulitis present Compartment soft  LABS  Results for orders placed or performed during the hospital encounter of 05/25/18 (from the past 24 hour(s))  Hemoglobin and hematocrit, blood     Status: Abnormal   Collection Time: 05/26/18  3:30 AM  Result Value Ref Range   Hemoglobin 11.5 (L) 13.0 - 17.0 g/dL   HCT 78.5 (L) 88.5 - 02.7 %  Basic metabolic panel     Status: Abnormal   Collection Time: 05/26/18  3:30 AM  Result Value Ref Range   Sodium 136 135 - 145 mmol/L   Potassium 4.3 3.5 - 5.1 mmol/L   Chloride 106 98 - 111 mmol/L   CO2 21 (L) 22 - 32 mmol/L   Glucose, Bld 121 (H) 70 - 99 mg/dL   BUN 17 6 - 20 mg/dL   Creatinine, Ser 7.41 0.61 - 1.24 mg/dL   Calcium 8.6 (L) 8.9 - 10.3 mg/dL   GFR calc non Af Amer >60 >60 mL/min   GFR calc Af Amer >60 >60 mL/min   Anion gap 9 5 - 15    Dg Shoulder Left Port  Result Date: 05/25/2018 CLINICAL DATA:  Status post left shoulder repair EXAM: LEFT SHOULDER - 1 VIEW COMPARISON:  04/15/2018 FINDINGS: Left shoulder prosthesis is noted in satisfactory position. No acute abnormality is noted. IMPRESSION: Status post left shoulder replacement Electronically Signed   By: Alcide Clever M.D.   On: 05/25/2018 11:41    Assessment/Plan: 1 Day Post-Op   Active Problems:   Osteonecrosis of shoulder region [facility])   Advance diet Up with therapy  Discharge home today.   Altamese Cabal ,  PA-C 05/26/2018, 10:42 AM

## 2018-05-26 NOTE — Anesthesia Postprocedure Evaluation (Signed)
Anesthesia Post Note  Patient: Charles Lamb  Procedure(s) Performed: LEFT SHOULDER HEMIARTHROPLASTY, BICEPS TENODESIS (Left )  Patient location during evaluation: PACU Anesthesia Type: Regional and General Level of consciousness: awake and alert Pain management: pain level controlled Vital Signs Assessment: post-procedure vital signs reviewed and stable Respiratory status: spontaneous breathing, nonlabored ventilation and respiratory function stable Cardiovascular status: blood pressure returned to baseline and stable Postop Assessment: no apparent nausea or vomiting Anesthetic complications: no     Last Vitals:  Vitals:   05/25/18 2337 05/26/18 0812  BP: 121/75 121/80  Pulse: 81 79  Resp: 16 18  Temp: 36.9 C   SpO2: 98% 100%    Last Pain:  Vitals:   05/26/18 0728  TempSrc:   PainSc: 0-No pain                 Jovita Gamma

## 2018-12-14 ENCOUNTER — Other Ambulatory Visit: Payer: Self-pay

## 2018-12-14 ENCOUNTER — Other Ambulatory Visit: Payer: Self-pay | Admitting: Family Medicine

## 2018-12-14 ENCOUNTER — Ambulatory Visit: Payer: Medicaid Other | Admitting: Family Medicine

## 2018-12-14 ENCOUNTER — Encounter: Payer: Self-pay | Admitting: Family Medicine

## 2018-12-14 DIAGNOSIS — I10 Essential (primary) hypertension: Secondary | ICD-10-CM

## 2018-12-14 DIAGNOSIS — B1910 Unspecified viral hepatitis B without hepatic coma: Secondary | ICD-10-CM | POA: Insufficient documentation

## 2018-12-14 DIAGNOSIS — M879 Osteonecrosis, unspecified: Secondary | ICD-10-CM | POA: Diagnosis not present

## 2018-12-14 DIAGNOSIS — J069 Acute upper respiratory infection, unspecified: Secondary | ICD-10-CM

## 2018-12-14 NOTE — Assessment & Plan Note (Signed)
Hepatitis B by blood donor status will repeat testing check CMP and if positive will refer to gastroenterology for further evaluation.

## 2018-12-14 NOTE — Assessment & Plan Note (Signed)
Surgical repair 

## 2018-12-14 NOTE — Progress Notes (Addendum)
There were no vitals taken for this visit.   Subjective:    Patient ID: Charles Lamb, male    DOB: Sep 20, 1960, 58 y.o.   MRN: [id number]  HPI: Charles Lamb is a 58 y.o. male  Concerned about Hepatitis B Patient with donating plasma was told recently that he has hepatitis B and cannot donate plasma anymore.  Patient with no known exposures or symptoms. This is been ongoing a couple of weeks. Patient shoulder is doing well with no complaints still has some minor pain but sleeps well at night. No blood pressure issues  Relevant past medical, surgical, family and social history reviewed and updated as indicated. Interim medical history since our last visit reviewed. Allergies and medications reviewed and updated.  Review of Systems  Constitutional: Negative.   Respiratory: Negative.   Cardiovascular: Negative.     Per HPI unless specifically indicated above     Objective:    There were no vitals taken for this visit.  Wt Readings from Last 3 Encounters:  05/25/18 187 lb 12.8 oz (85.2 kg)  05/18/18 187 lb (84.8 kg)  05/18/18 187 lb 12.8 oz (85.2 kg)    Physical Exam  Results for orders placed or performed during the hospital encounter of 05/25/18  Hemoglobin and hematocrit, blood  Result Value Ref Range   Hemoglobin 11.5 (L) 13.0 - 17.0 g/dL   HCT 35.8 (L) 39.0 - 51.0 %  Basic metabolic panel  Result Value Ref Range   Sodium 136 135 - 145 mmol/L   Potassium 4.3 3.5 - 5.1 mmol/L   Chloride 106 98 - 111 mmol/L   CO2 21 (L) 22 - 32 mmol/L   Glucose, Bld 121 (H) 70 - 99 mg/dL   BUN 17 6 - 20 mg/dL   Creatinine, Ser 1.02 0.61 - 1.24 mg/dL   Calcium 8.6 (L) 8.9 - 10.3 mg/dL   GFR calc non Af Amer >60 >60 mL/min   GFR calc Af Amer >60 >60 mL/min   Anion gap 9 5 - 15  Surgical pathology  Result Value Ref Range   SURGICAL PATHOLOGY      Surgical Pathology CASE: [id number] PATIENT: Zayd Deschene Surgical Pathology Report     SPECIMEN SUBMITTED: A.  Humeral head,left  CLINICAL HISTORY: None provided  PRE-OPERATIVE DIAGNOSIS: Degenerative joint disease of shoulder region  POST-OPERATIVE DIAGNOSIS: Left shoulder osteoarthritis     DIAGNOSIS: A.  HUMERAL HEAD, LEFT; HEMIARTHROPLASTY: - OSTEONECROSIS.  GROSS DESCRIPTION: A. Labeled: Left humeral head Received: Formalin Size of specimen: 4.5 x 4.5 x 1.8 cm Articular surface: Tan-yellow with multifocal areas of possible, slight granularity Cut surface: Sectioning displays a focal area of slight yellow discoloration, up to 1.2 cm in greatest dimension.  The remaining bone parenchyma is tan-yellow and otherwise grossly unremarkable. Other findings: The surgical resection margin is tan-yellow with a clean cut.  Block summary: 1-2 - representative sections displaying bone parenchyma with slight yellow discoloration and surrounding gross ly normal bone parenchyma (following decalcification)  Final Diagnosis performed by Bryan Lemma, MD.   Electronically signed 05/26/2018 3:53:35PM The electronic signature indicates that the named Attending Pathologist has evaluated the specimen  Technical component performed at [employer], [address] [place], [place] [address] Lab: [phone] Dir: Rush Farmer, MD, MMM  Professional component performed at [facility], [facility], [place], [place], [place] [address] Lab: [phone] Dir: Dellia Nims. Reuel Derby, MD       Assessment & Plan:   Problem List Items Addressed This  Visit      Cardiovascular and Mediastinum   Hypertension    Diet controlled not taking any medicines        Digestive   Hepatitis B    Hepatitis B by blood donor status will repeat testing check CMP and if positive will refer to gastroenterology for further evaluation.        Musculoskeletal and Integument   Osteonecrosis of shoulder region [facility])    Surgical repair         Telemedicine using audio/video telecommunications for a  synchronous communication visit. Today's visit due to COVID-19 isolation precautions I connected with and verified that I am speaking with the correct person using two identifiers.   I discussed the limitations, risks, security and privacy concerns of performing an evaluation and management service by telecommunication and the availability of in person appointments. I also discussed with the patient that there may be a patient responsible charge related to this service. The patient expressed understanding and agreed to proceed. The patient's location is home. I am at home.   I discussed the assessment and treatment plan with the patient. The patient was provided an opportunity to ask questions and all were answered. The patient agreed with the plan and demonstrated an understanding of the instructions.   The patient was advised to call back or seek an in-person evaluation if the symptoms worsen or if the condition fails to improve as anticipated.   I provided 21+ minutes of time during this encounter. Follow up plan: Return if symptoms worsen or fail to improve, for As scheduled.

## 2018-12-14 NOTE — Addendum Note (Signed)
Addended by: Gerrit Halls L on: 12/14/2018 03:01 PM   Modules accepted: Orders

## 2018-12-14 NOTE — Assessment & Plan Note (Signed)
Diet controlled not taking any medicines

## 2018-12-15 ENCOUNTER — Encounter: Payer: Self-pay | Admitting: Family Medicine

## 2018-12-16 LAB — COMPREHENSIVE METABOLIC PANEL
ALT: 9 IU/L (ref 0–44)
AST: 10 IU/L (ref 0–40)
Albumin/Globulin Ratio: 1.5 (ref 1.2–2.2)
Albumin: 4 g/dL (ref 3.8–4.9)
Alkaline Phosphatase: 97 IU/L (ref 39–117)
BUN/Creatinine Ratio: 11 (ref 9–20)
BUN: 11 mg/dL (ref 6–24)
Bilirubin Total: 0.3 mg/dL (ref 0.0–1.2)
CO2: 22 mmol/L (ref 20–29)
Calcium: 9.4 mg/dL (ref 8.7–10.2)
Chloride: 102 mmol/L (ref 96–106)
Creatinine, Ser: 1.02 mg/dL (ref 0.76–1.27)
GFR calc Af Amer: 95 mL/min/{1.73_m2} (ref >59)
GFR calc non Af Amer: 82 mL/min/{1.73_m2} (ref >59)
Globulin, Total: 2.7 g/dL (ref 1.5–4.5)
Glucose: 88 mg/dL (ref 65–99)
Potassium: 4.4 mmol/L (ref 3.5–5.2)
Sodium: 138 mmol/L (ref 134–144)
Total Protein: 6.7 g/dL (ref 6.0–8.5)

## 2018-12-16 LAB — HEPATITIS B CORE ANTIBODY, TOTAL: Hep B Core Total Ab: POSITIVE — AB

## 2018-12-16 LAB — HEPATITIS C ANTIBODY: Hep C Virus Ab: 0.2 s/co ratio (ref 0.0–0.9)

## 2018-12-16 LAB — EUROIMMUN SARS-COV-2 AB, IGG: Euroimmun SARS-CoV-2 Ab, IgG: NEGATIVE

## 2018-12-16 LAB — SAR COV2 SEROLOGY (COVID19)AB(IGG),IA

## 2018-12-19 ENCOUNTER — Other Ambulatory Visit: Payer: Self-pay | Admitting: Family Medicine

## 2018-12-19 DIAGNOSIS — B1910 Unspecified viral hepatitis B without hepatic coma: Secondary | ICD-10-CM

## 2019-02-01 ENCOUNTER — Other Ambulatory Visit: Payer: Self-pay

## 2019-02-01 ENCOUNTER — Ambulatory Visit: Payer: Medicaid Other | Admitting: Gastroenterology

## 2019-02-01 ENCOUNTER — Encounter: Payer: Self-pay | Admitting: Gastroenterology

## 2019-02-01 VITALS — BP 131/76 | HR 99 | Temp 98.1°F | Ht 73.5 in | Wt 193.0 lb

## 2019-02-01 DIAGNOSIS — R768 Other specified abnormal immunological findings in serum: Secondary | ICD-10-CM | POA: Diagnosis not present

## 2019-02-01 DIAGNOSIS — Z1211 Encounter for screening for malignant neoplasm of colon: Secondary | ICD-10-CM

## 2019-02-01 NOTE — Progress Notes (Signed)
Wyline Mood MD, MRCP(U.K) [address]  [address]  [place], [place] [address]  Main: [phone]  Fax: [phone]   Gastroenterology Consultation  Referring Provider:     Steele Sizer, MD Primary Care Physician:  Steele Sizer, MD Primary Gastroenterologist:  Dr. Wyline Mood  Reason for Consultation:     Hepatitis B        HPI:   Charles Lamb is a 58 y.o. y/o male referred for consultation & management  by Dr. Dossie Arbour, Redge Gainer, MD.     He has been referred for a hepatitis B core total antibody that is positive.  As per the lab result on 12/14/2018.  Hepatitis C virus antibody is negative.  HIV antibody was negative in November 2019.  CMP completely normal in 12/04/2018.  CBC normal in 12/04/2018.  He states that he last used cocaine back in the year 2000, has been in prison previously.  No meds prior [employer].  Denies any tattoos.  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  Sexually active with the opposite sex same partner for many years.  Does not recall being treated for hepatitis B in the past.  Never had a colonoscopy for colon cancer screening.  No family history of colon cancer or polyps.  Past Medical History:  Diagnosis Date  . Hypertension   . Renal disorder     Past Surgical History:  Procedure Laterality Date  . EYE SURGERY Left   . TOTAL SHOULDER ARTHROPLASTY Left 05/25/2018   Procedure: LEFT SHOULDER HEMIARTHROPLASTY, BICEPS TENODESIS;  Surgeon: Lyndle Herrlich, MD;  Location: [facility] ORS;  Service: Orthopedics;  Laterality: Left;    Prior to Admission medications   Medication Sig Start Date End Date Taking? Authorizing Provider  aspirin EC 325 MG EC tablet Take 1 tablet (325 mg total) by mouth daily. Patient not taking: Reported on 02/01/2019 05/27/18   Altamese Cabal, PA-C    Family History  Problem Relation Age of Onset  . Kidney disease Mother   . Kidney disease Brother      Social History   Tobacco Use  .  Smoking status: Current Every Day Smoker    Packs/day: 0.25    Types: Cigarettes  . Smokeless tobacco: Never Used  Substance Use Topics  . Alcohol use: Not Currently    Comment: 1 40oz a day  . Drug use: Not Currently    Allergies as of 02/01/2019  . (No Known Allergies)    Review of Systems:    All systems reviewed and negative except where noted in HPI.   Physical Exam:  BP 131/76   Pulse 99   Temp 98.1 F (36.7 C)   Ht 6' 1.5" (1.867 m)   Wt 193 lb (87.5 kg)   BMI 25.12 kg/m  No LMP for male patient. Psych:  Alert and cooperative. Normal mood and affect. General:   Alert,  Well-developed, well-nourished, pleasant and cooperative in NAD Head:  Normocephalic and atraumatic. Eyes:  Sclera clear, no icterus.   Conjunctiva pink. Ears:  Normal auditory acuity. Nose:  No deformity, discharge, or lesions. Mouth:  No deformity or lesions,oropharynx pink & moist. Neck:  Supple; no masses or thyromegaly. Lungs:  Respirations even and unlabored.  Clear throughout to auscultation.   No wheezes, crackles, or rhonchi. No acute distress. Heart:  Regular rate and rhythm; no murmurs, clicks, rubs, or gallops. Abdomen:  Normal bowel sounds.  No bruits.  Soft, non-tender and  non-distended without masses, hepatosplenomegaly or hernias noted.  No guarding or rebound tenderness.    Neurologic:  Alert and oriented x3;  grossly normal neurologically. Skin:  Intact without significant lesions or rashes. No jaundice. Lymph Nodes:  No significant cervical adenopathy. Psych:  Alert and cooperative. Normal mood and affect.  Imaging Studies: No results found.  Assessment and Plan:   Charles Lamb is a 58 y.o. y/o male has been referred for a positive hepatitis B core total antibody.  This usually indicates a past infection which the body has spontaneously cleared as it can occur in over 95% of patients who are infected with hepatitis B.  He does not have hepatitis C or HIV based on prior lab  results.  He did have high risk behavior many years back but seems to have stopped those habits.  I will check his labs to confirm that he does not have hyper active hepatitis B.  In addition he has never had a screening colonoscopy and we will proceed with the same.  I have discussed alternative options, risks & benefits,  which include, but are not limited to, bleeding, infection, perforation,respiratory complication & drug reaction.  The patient agrees with this plan & written consent will be obtained.     Follow up in 4 to 6 weeks to discuss results of his tests  Dr Jonathon Bellows MD,MRCP(U.K)

## 2019-02-03 ENCOUNTER — Other Ambulatory Visit
Admission: RE | Admit: 2019-02-03 | Discharge: 2019-02-03 | Disposition: A | Discharge status: Home / Self Care | Payer: Medicaid Other | Source: Ambulatory Visit | Attending: Gastroenterology | Admitting: Gastroenterology

## 2019-02-03 ENCOUNTER — Other Ambulatory Visit: Payer: Self-pay

## 2019-02-03 DIAGNOSIS — Z01812 Encounter for preprocedural laboratory examination: Secondary | ICD-10-CM | POA: Insufficient documentation

## 2019-02-03 DIAGNOSIS — Z20828 Contact with and (suspected) exposure to other viral communicable diseases: Secondary | ICD-10-CM | POA: Diagnosis not present

## 2019-02-03 LAB — HEPATITIS B DNA, ULTRAQUANTITATIVE, PCR
HBV DNA SERPL PCR-ACNC: 20 IU/mL
HBV DNA SERPL PCR-LOG IU: 1.301 log10 IU/mL

## 2019-02-03 LAB — HEPATITIS B SURFACE ANTIGEN: Hepatitis B Surface Ag: NEGATIVE

## 2019-02-03 LAB — HEPATITIS B SURFACE ANTIBODY,QUALITATIVE: Hep B Surface Ab, Qual: NONREACTIVE

## 2019-02-03 LAB — HEPATITIS B E ANTIGEN: Hep B E Ag: NEGATIVE

## 2019-02-03 LAB — HEPATITIS B E ANTIBODY

## 2019-02-03 LAB — SARS CORONAVIRUS 2 (TAT 6-24 HRS): SARS Coronavirus 2: NEGATIVE

## 2019-02-03 LAB — HEPATITIS A ANTIBODY, TOTAL: hep A Total Ab: NEGATIVE

## 2019-02-07 ENCOUNTER — Encounter
Admission: RE | Disposition: A | Discharge status: Home / Self Care | Payer: Self-pay | Source: Home / Self Care | Attending: Gastroenterology

## 2019-02-07 ENCOUNTER — Encounter: Payer: Self-pay | Admitting: *Deleted

## 2019-02-07 ENCOUNTER — Ambulatory Visit: Payer: Medicaid Other | Admitting: Anesthesiology

## 2019-02-07 ENCOUNTER — Other Ambulatory Visit: Payer: Self-pay

## 2019-02-07 ENCOUNTER — Ambulatory Visit
Admission: RE | Admit: 2019-02-07 | Discharge: 2019-02-07 | Disposition: A | Discharge status: Home / Self Care | Payer: Medicaid Other | Attending: Gastroenterology | Admitting: Gastroenterology

## 2019-02-07 DIAGNOSIS — Z96612 Presence of left artificial shoulder joint: Secondary | ICD-10-CM | POA: Diagnosis not present

## 2019-02-07 DIAGNOSIS — Z1211 Encounter for screening for malignant neoplasm of colon: Secondary | ICD-10-CM | POA: Diagnosis present

## 2019-02-07 DIAGNOSIS — I10 Essential (primary) hypertension: Secondary | ICD-10-CM | POA: Diagnosis not present

## 2019-02-07 DIAGNOSIS — F17210 Nicotine dependence, cigarettes, uncomplicated: Secondary | ICD-10-CM | POA: Diagnosis not present

## 2019-02-07 DIAGNOSIS — Z7982 Long term (current) use of aspirin: Secondary | ICD-10-CM | POA: Insufficient documentation

## 2019-02-07 DIAGNOSIS — K5730 Diverticulosis of large intestine without perforation or abscess without bleeding: Secondary | ICD-10-CM | POA: Insufficient documentation

## 2019-02-07 DIAGNOSIS — K640 First degree hemorrhoids: Secondary | ICD-10-CM | POA: Insufficient documentation

## 2019-02-07 HISTORY — PX: COLONOSCOPY WITH PROPOFOL: SHX5780

## 2019-02-07 SURGERY — COLONOSCOPY WITH PROPOFOL
Anesthesia: General

## 2019-02-07 MED ORDER — PROPOFOL 10 MG/ML IV BOLUS
INTRAVENOUS | Status: DC | PRN
  Administered 2019-02-07 (×2): 50 mg via INTRAVENOUS

## 2019-02-07 MED ORDER — PROPOFOL 500 MG/50ML IV EMUL
INTRAVENOUS | Status: DC | PRN
  Administered 2019-02-07: 150 ug/kg/min via INTRAVENOUS

## 2019-02-07 MED ORDER — SODIUM CHLORIDE 0.9 % IV SOLN
INTRAVENOUS | Status: DC
  Administered 2019-02-07: 08:00:00 via INTRAVENOUS

## 2019-02-07 MED ORDER — PROPOFOL 500 MG/50ML IV EMUL
INTRAVENOUS | Status: AC
  Filled 2019-02-07: qty 50

## 2019-02-07 MED ORDER — LIDOCAINE HCL (PF) 2 % IJ SOLN
INTRAMUSCULAR | Status: AC
  Filled 2019-02-07: qty 10

## 2019-02-07 MED ORDER — LIDOCAINE HCL (CARDIAC) PF 100 MG/5ML IV SOSY
PREFILLED_SYRINGE | INTRAVENOUS | Status: DC | PRN
  Administered 2019-02-07: 50 mg via INTRATRACHEAL

## 2019-02-07 NOTE — H&P (Signed)
Wyline Mood, MD [address], [address], [place], [place], [address] [address], [address], [place], [place], [address] Phone: [phone]  Fax: [phone]  Primary Care Physician:  Steele Sizer, MD   Pre-Procedure History & Physical: HPI:  Charles Lamb is a 58 y.o. male is here for an colonoscopy.   Past Medical History:  Diagnosis Date  . Hypertension   . Renal disorder     Past Surgical History:  Procedure Laterality Date  . EYE SURGERY Left   . TOTAL SHOULDER ARTHROPLASTY Left 05/25/2018   Procedure: LEFT SHOULDER HEMIARTHROPLASTY, BICEPS TENODESIS;  Surgeon: Lyndle Herrlich, MD;  Location: [facility] ORS;  Service: Orthopedics;  Laterality: Left;    Prior to Admission medications   Medication Sig Start Date End Date Taking? Authorizing Provider  aspirin EC 325 MG EC tablet Take 1 tablet (325 mg total) by mouth daily. Patient not taking: Reported on 02/01/2019 05/27/18   Altamese Cabal, PA-C    Allergies as of 02/01/2019  . (No Known Allergies)    Family History  Problem Relation Age of Onset  . Kidney disease Mother   . Kidney disease Brother     Social History   Socioeconomic History  . Marital status: Single    Spouse name: Not on file  . Number of children: Not on file  . Years of education: Not on file  . Highest education level: Not on file  Occupational History  . Not on file  Social Needs  . Financial resource strain: Not on file  . Food insecurity    Worry: Not on file    Inability: Not on file  . Transportation needs    Medical: Not on file    Non-medical: Not on file  Tobacco Use  . Smoking status: Current Every Day Smoker    Packs/day: 0.25    Types: Cigarettes  . Smokeless tobacco: Never Used  Substance and Sexual Activity  . Alcohol use: Not Currently    Comment: 1 40oz a day  . Drug use: Not Currently  . Sexual activity: Not on file  Lifestyle  . Physical activity    Days per week: 0 days    Minutes per session: 0  min  . Stress: Not on file  Relationships  . Social connections    Talks on phone: More than three times a week    Gets together: More than three times a week    Attends religious service: Not on file    Active member of club or organization: Not on file    Attends meetings of clubs or organizations: Not on file    Relationship status: Not on file  . Intimate partner violence    Fear of current or ex partner: Not on file    Emotionally abused: Not on file    Physically abused: Not on file    Forced sexual activity: Not on file  Other Topics Concern  . Not on file  Social History Narrative  . Not on file    Review of Systems: See HPI, otherwise negative ROS  Physical Exam: BP 127/88   Pulse 78   Temp (!) 96.7 F (35.9 C)   Ht 6\' 2"  (1.88 m)   Wt 87.5 kg   SpO2 100%   BMI 24.78 kg/m  General:   Alert,  pleasant and cooperative in NAD Head:  Normocephalic and atraumatic. Neck:  Supple; no masses or thyromegaly. Lungs:  Clear throughout to auscultation,  normal respiratory effort.    Heart:  +S1, +S2, Regular rate and rhythm, No edema. Abdomen:  Soft, nontender and nondistended. Normal bowel sounds, without guarding, and without rebound.   Neurologic:  Alert and  oriented x4;  grossly normal neurologically.  Impression/Plan: Charles Lamb is here for an colonoscopy to be performed for Screening colonoscopy average risk   Risks, benefits, limitations, and alternatives regarding  colonoscopy have been reviewed with the patient.  Questions have been answered.  All parties agreeable.   Jonathon Bellows, MD  02/07/2019, 8:29 AM

## 2019-02-07 NOTE — Op Note (Signed)
[facility] Gastroenterology Patient Name: Raeanne BarryDwight Ackerley Procedure Date: 02/07/2019 8:37 AM MRN: [id number] Account #: [id number] Date of Birth: 1960-08-17 Admit Type: Outpatient Age: 58 [facility] Gender: Male Note Status: Finalized Procedure:             Colonoscopy Indications:           Screening for colorectal malignant neoplasm Providers:             Wyline MoodKiran Taneshia Lorence MD, MD Referring MD:          Steele SizerMark A. Crissman, MD (Referring MD) Medicines:             Propofol per Anesthesia, Monitored Anesthesia Care Complications:         No immediate complications. Procedure:             Pre-Anesthesia Assessment:                        - Prior to the procedure, a History and Physical was                         performed, and patient medications, allergies and                         sensitivities were reviewed. The patient's tolerance                         of previous anesthesia was reviewed.                        - The risks and benefits of the procedure and the                         sedation options and risks were discussed with the                         patient. All questions were answered and informed                         consent was obtained.                        - ASA Grade Assessment: II - A patient with mild                         systemic disease.                        After obtaining informed consent, the colonoscope was                         passed under direct vision. Throughout the procedure,                         the patient's blood pressure, pulse, and oxygen                         saturations were monitored continuously. The                         Colonoscope was introduced  through the anus and                         advanced to the the cecum, identified by the                         appendiceal orifice. The colonoscopy was performed                         with ease. The patient tolerated the procedure well.               The quality of the bowel preparation was good. Findings:      The perianal and digital rectal examinations were normal.      Multiple small-mouthed diverticula were found in the sigmoid colon.      Non-bleeding internal hemorrhoids were found during retroflexion. The       hemorrhoids were large and Grade I (internal hemorrhoids that do not       prolapse).      The exam was otherwise without abnormality on direct and retroflexion       views. Impression:            - Diverticulosis in the sigmoid colon.                        - Non-bleeding internal hemorrhoids.                        - The examination was otherwise normal on direct and                         retroflexion views.                        - No specimens collected. Recommendation:        - Discharge patient to home (with escort).                        - Resume previous diet.                        - Continue present medications.                        - Repeat colonoscopy in 10 years for screening                         purposes. Procedure Code(s):     --- Professional ---                        [phone], Colonoscopy, flexible; diagnostic, including                         collection of specimen(s) by brushing or washing, when                         performed (separate procedure) Diagnosis Code(s):     --- Professional ---                        Z12.11, Encounter for screening for malignant neoplasm  of colon                        K64.0, First degree hemorrhoids                        K57.30, Diverticulosis of large intestine without                         perforation or abscess without bleeding CPT copyright 2019 American Medical Association. All rights reserved. The codes documented in this report are preliminary and upon coder review may  be revised to meet current compliance requirements. Jonathon Bellows, MD Jonathon Bellows MD, MD 02/07/2019 8:59:51 AM This report has been signed  electronically. Number of Addenda: 0 Note Initiated On: 02/07/2019 8:37 AM Scope Withdrawal Time: 0 hours 9 minutes 12 seconds  Total Procedure Duration: 0 hours 15 minutes 22 seconds  Estimated Blood Loss:  Estimated blood loss: none.      [facility]

## 2019-02-07 NOTE — Transfer of Care (Signed)
Immediate Anesthesia Transfer of Care Note  Patient: [name]  Procedure(s) Performed: COLONOSCOPY WITH PROPOFOL (N/A )  Patient Location: PACU  Anesthesia Type:General  Level of Consciousness: sedated  Airway & Oxygen Therapy: Patient Spontanous Breathing and Patient connected to nasal cannula oxygen  Post-op Assessment: Report given to RN and Post -op Vital signs reviewed and stable  Post vital signs: Reviewed and stable  Last Vitals:  Vitals Value Taken Time  BP 95/69 02/07/19 0901  Temp 35.7 C 02/07/19 0900  Pulse 82 02/07/19 0901  Resp 21 02/07/19 0901  SpO2 99 % 02/07/19 0901  Vitals shown include unvalidated device data.  Last Pain:  Vitals:   02/07/19 0900  TempSrc: Temporal  PainSc:          Complications: No apparent anesthesia complications

## 2019-02-07 NOTE — Anesthesia Preprocedure Evaluation (Signed)
Anesthesia Evaluation  Patient identified by MRN, date of birth, ID band Patient awake    Reviewed: Allergy & Precautions, H&P , NPO status , Patient's Chart, lab work & pertinent test results, reviewed documented beta blocker date and time   Airway Mallampati: II   Neck ROM: full    Dental  (+) Poor Dentition   Pulmonary neg pulmonary ROS, Current SmokerPatient did not abstain from smoking.,    Pulmonary exam normal        Cardiovascular Exercise Tolerance: Good hypertension, On Medications negative cardio ROS Normal cardiovascular exam Rhythm:regular Rate:Normal     Neuro/Psych PSYCHIATRIC DISORDERS negative neurological ROS     GI/Hepatic negative GI ROS, (+) Hepatitis -  Endo/Other  negative endocrine ROS  Renal/GU Renal disease  negative genitourinary   Musculoskeletal   Abdominal   Peds  Hematology negative hematology ROS (+)   Anesthesia Other Findings Past Medical History: No date: Hypertension No date: Renal disorder Past Surgical History: No date: EYE SURGERY; Left 05/25/2018: TOTAL SHOULDER ARTHROPLASTY; Left     Comment:  Procedure: LEFT SHOULDER HEMIARTHROPLASTY, BICEPS               TENODESIS;  Surgeon: Lovell Sheehan, MD;  Location: [facility]              ORS;  Service: Orthopedics;  Laterality: Left;   Reproductive/Obstetrics negative OB ROS                             Anesthesia Physical Anesthesia Plan  ASA: III  Anesthesia Plan: General   Post-op Pain Management:    Induction:   PONV Risk Score and Plan:   Airway Management Planned:   Additional Equipment:   Intra-op Plan:   Post-operative Plan:   Informed Consent: I have reviewed the patients History and Physical, chart, labs and discussed the procedure including the risks, benefits and alternatives for the proposed anesthesia with the patient or authorized representative who has indicated his/her  understanding and acceptance.     Dental Advisory Given  Plan Discussed with: CRNA  Anesthesia Plan Comments:         Anesthesia Quick Evaluation

## 2019-02-07 NOTE — Anesthesia Post-op Follow-up Note (Signed)
Anesthesia QCDR form completed.        

## 2019-02-08 ENCOUNTER — Encounter: Payer: Self-pay | Admitting: Gastroenterology

## 2019-02-14 ENCOUNTER — Other Ambulatory Visit: Payer: Self-pay

## 2019-02-14 ENCOUNTER — Telehealth: Payer: Self-pay

## 2019-02-14 DIAGNOSIS — R768 Other specified abnormal immunological findings in serum: Secondary | ICD-10-CM | POA: Diagnosis not present

## 2019-02-14 NOTE — Anesthesia Postprocedure Evaluation (Signed)
Anesthesia Post Note  Patient: Charles Lamb  Procedure(s) Performed: COLONOSCOPY WITH PROPOFOL (N/A )  Patient location during evaluation: PACU Anesthesia Type: General Level of consciousness: awake and alert Pain management: pain level controlled Vital Signs Assessment: post-procedure vital signs reviewed and stable Respiratory status: spontaneous breathing, nonlabored ventilation, respiratory function stable and patient connected to nasal cannula oxygen Cardiovascular status: blood pressure returned to baseline and stable Postop Assessment: no apparent nausea or vomiting Anesthetic complications: no     Last Vitals:  Vitals:   02/07/19 0751 02/07/19 0900  BP: 127/88   Pulse: 78   Temp: (!) 35.9 C (!) 35.7 C  SpO2: 100%     Last Pain:  Vitals:   02/07/19 0930  TempSrc:   PainSc: 0-No pain                 Molli Barrows

## 2019-02-14 NOTE — Telephone Encounter (Signed)
Spoke with pt and informed him of lab results and explained that we will need to recollect labs for confirmation of recent results. Pt understands and agrees. Pt plans to visit our office lab today.

## 2019-02-14 NOTE — Telephone Encounter (Signed)
-----   Message from Jonathon Bellows, MD sent at 02/06/2019 12:16 PM EST ----- Sherald Hess inform the patient the lab wants to recheck Hepatitis Be e antibody , also recheck Hep B viral load and hepatitis B core total antibody .   C/.c Guadalupe Maple, MD   Dr Jonathon Bellows MD,MRCP [facility]) Gastroenterology/Hepatology Pager: [phone]

## 2019-02-16 LAB — HEPATITIS B DNA, ULTRAQUANTITATIVE, PCR: HBV DNA SERPL PCR-ACNC: <10 IU/mL

## 2019-02-16 LAB — HEPATITIS B CORE ANTIBODY, TOTAL: Hep B Core Total Ab: POSITIVE — AB

## 2019-02-16 LAB — HEPATITIS B E ANTIBODY: Hep B E Ab: POSITIVE — AB

## 2019-02-24 ENCOUNTER — Telehealth: Payer: Self-pay

## 2019-02-24 NOTE — Telephone Encounter (Signed)
Pt called to request the results of his repeated Hep B lab tests. I explained that Dr. Vicente Males has not reviewed the results yet and that we will contact him once Dr. Vicente Males reviews results and gives recommendations.

## 2019-02-24 NOTE — Telephone Encounter (Signed)
Inform very low levels of Hepattiis B virus detected- so low that lab cant give a value. Possible the body is fighting it off.  Repeat Hep B viral load, Hebatitis Delta antigen and viral load, Hep B surface antigen , antiobody , Hep B core antibody and E antigen , e antibody in 6 weeks

## 2019-02-27 ENCOUNTER — Other Ambulatory Visit: Payer: Self-pay

## 2019-02-27 DIAGNOSIS — R768 Other specified abnormal immunological findings in serum: Secondary | ICD-10-CM

## 2019-02-27 NOTE — Telephone Encounter (Signed)
Called pt to inform him of lab results and Dr. Anna's recommendations.  Unable to contact, LVM to return call 

## 2019-02-27 NOTE — Telephone Encounter (Signed)
Spoke with pt and informed him of results and Dr. Georgeann Oppenheim recommendations. Pt agrees and plans to visit our office lab 1 week prior to his scheduled follow up visit at the end of January 2021.

## 2019-03-23 ENCOUNTER — Telehealth: Payer: Self-pay

## 2019-03-23 NOTE — Progress Notes (Signed)
Yes - reschedule please

## 2019-03-23 NOTE — Telephone Encounter (Signed)
-----   Message from Wyline Mood, MD sent at 03/23/2019  8:42 AM EST ----- Yes reschedule please

## 2019-03-23 NOTE — Telephone Encounter (Signed)
Spoke with pt and informed him of Dr. Johnney Killian recommendation to hold off on repeating Hepatitis and LFT labs and follow up visit until March. Pt understands and agrees.

## 2019-04-10 ENCOUNTER — Ambulatory Visit: Payer: Medicaid Other | Admitting: Gastroenterology

## 2019-04-27 ENCOUNTER — Telehealth: Payer: Self-pay | Admitting: Family Medicine

## 2019-04-28 ENCOUNTER — Encounter: Payer: Self-pay | Admitting: Family Medicine

## 2019-04-28 ENCOUNTER — Ambulatory Visit (INDEPENDENT_AMBULATORY_CARE_PROVIDER_SITE_OTHER): Payer: Medicaid Other | Admitting: Family Medicine

## 2019-04-28 ENCOUNTER — Other Ambulatory Visit: Payer: Self-pay

## 2019-04-28 VITALS — BP 117/71 | HR 86 | Temp 98.1°F | Ht 77.0 in | Wt 202.0 lb

## 2019-04-28 DIAGNOSIS — E7800 Pure hypercholesterolemia, unspecified: Secondary | ICD-10-CM | POA: Diagnosis not present

## 2019-04-28 DIAGNOSIS — Z125 Encounter for screening for malignant neoplasm of prostate: Secondary | ICD-10-CM | POA: Diagnosis not present

## 2019-04-28 DIAGNOSIS — Z0000 Encounter for general adult medical examination without abnormal findings: Secondary | ICD-10-CM | POA: Diagnosis not present

## 2019-04-28 DIAGNOSIS — I10 Essential (primary) hypertension: Secondary | ICD-10-CM

## 2019-04-28 DIAGNOSIS — I700 Atherosclerosis of aorta: Secondary | ICD-10-CM

## 2019-04-28 LAB — UA/M W/RFLX CULTURE, ROUTINE
Bilirubin, UA: NEGATIVE
Glucose, UA: NEGATIVE
Ketones, UA: NEGATIVE
Leukocytes,UA: NEGATIVE
Nitrite, UA: NEGATIVE
Protein,UA: NEGATIVE
RBC, UA: NEGATIVE
Specific Gravity, UA: 1.02 (ref 1.005–1.030)
Urobilinogen, Ur: 0.2 mg/dL (ref 0.2–1.0)
pH, UA: 6 (ref 5.0–7.5)

## 2019-04-28 NOTE — Progress Notes (Signed)
BP 117/71   Pulse 86   Temp 98.1 F (36.7 C) (Oral)   Ht 6\' 5"  (1.956 m)   Wt 202 lb (91.6 kg)   SpO2 99%   BMI 23.95 kg/m    Subjective:    Patient ID: Charles Lamb, male    DOB: 1961-02-13, 59 y.o.   MRN: [id number]  HPI: Charles Lamb is a 59 y.o. male presenting on 04/28/2019 for comprehensive medical examination. Current medical complaints include:none  He currently lives with: Interim Problems from his last visit: no  Depression Screen done today and results listed below:  Depression screen [facility] 2/9 01/27/2018 01/12/2018  Decreased Interest - 2  Down, Depressed, Hopeless - 0  PHQ - 2 Score - 2  Altered sleeping 0 2  Tired, decreased energy 3 1  Change in appetite 0 0  Feeling bad or failure about yourself  0 0  Trouble concentrating 0 0  Moving slowly or fidgety/restless 0 0  Suicidal thoughts 0 0  PHQ-9 Score - 5    The patient does not have a history of falls. I did complete a risk assessment for falls. A plan of care for falls was documented.   Past Medical History:  Past Medical History:  Diagnosis Date  . Hypertension   . Renal disorder     Surgical History:  Past Surgical History:  Procedure Laterality Date  . COLONOSCOPY WITH PROPOFOL N/A 02/07/2019   Procedure: COLONOSCOPY WITH PROPOFOL;  Surgeon: Jonathon Bellows, MD;  Location: [facility] ENDOSCOPY;  Service: Gastroenterology;  Laterality: N/A;  . EYE SURGERY Left   . TOTAL SHOULDER ARTHROPLASTY Left 05/25/2018   Procedure: LEFT SHOULDER HEMIARTHROPLASTY, BICEPS TENODESIS;  Surgeon: Lovell Sheehan, MD;  Location: [facility] ORS;  Service: Orthopedics;  Laterality: Left;    Medications:  Current Outpatient Medications on File Prior to Visit  Medication Sig  . aspirin EC 325 MG EC tablet Take 1 tablet (325 mg total) by mouth daily.   No current facility-administered medications on file prior to visit.    Allergies:  No Known Allergies  Social History:  Social History   Socioeconomic History  .  Marital status: Single    Spouse name: Not on file  . Number of children: Not on file  . Years of education: Not on file  . Highest education level: Not on file  Occupational History  . Not on file  Tobacco Use  . Smoking status: Current Every Day Smoker    Packs/day: 0.25    Types: Cigarettes  . Smokeless tobacco: Never Used  Substance and Sexual Activity  . Alcohol use: Not Currently    Comment: 1 40oz a day  . Drug use: Not Currently  . Sexual activity: Not on file  Other Topics Concern  . Not on file  Social History Narrative  . Not on file   Social Determinants of Health   Financial Resource Strain:   . Difficulty of Paying Living Expenses: Not on file  Food Insecurity:   . Worried About Charity fundraiser in the Last Year: Not on file  . Ran Out of Food in the Last Year: Not on file  Transportation Needs:   . Lack of Transportation (Medical): Not on file  . Lack of Transportation (Non-Medical): Not on file  Physical Activity:   . Days of Exercise per Week: Not on file  . Minutes of Exercise per Session: Not on file  Stress:   . Feeling of Stress : Not  on file  Social Connections:   . Frequency of Communication with Friends and Family: Not on file  . Frequency of Social Gatherings with Friends and Family: Not on file  . Attends Religious Services: Not on file  . Active Member of Clubs or Organizations: Not on file  . Attends Banker Meetings: Not on file  . Marital Status: Not on file  Intimate Partner Violence:   . Fear of Current or Ex-Partner: Not on file  . Emotionally Abused: Not on file  . Physically Abused: Not on file  . Sexually Abused: Not on file   Social History   Tobacco Use  Smoking Status Current Every Day Smoker  . Packs/day: 0.25  . Types: Cigarettes  Smokeless Tobacco Never Used   Social History   Substance and Sexual Activity  Alcohol Use Not Currently   Comment: 1 40oz a day    Family History:  Family History   Problem Relation Age of Onset  . Kidney disease Mother   . Kidney disease Brother     Past medical history, surgical history, medications, allergies, family history and social history reviewed with patient today and changes made to appropriate areas of the chart.   Review of Systems - General ROS: negative Psychological ROS: negative Ophthalmic ROS: negative ENT ROS: negative Allergy and Immunology ROS: negative Hematological and Lymphatic ROS: negative Endocrine ROS: negative Breast ROS: negative for breast lumps Respiratory ROS: no cough, shortness of breath, or wheezing Cardiovascular ROS: no chest pain or dyspnea on exertion Gastrointestinal ROS: no abdominal pain, change in bowel habits, or black or bloody stools Genito-Urinary ROS: no dysuria, trouble voiding, or hematuria Musculoskeletal ROS: negative Neurological ROS: no TIA or stroke symptoms Dermatological ROS: negative All other ROS negative except what is listed above and in the HPI.      Objective:    BP 117/71   Pulse 86   Temp 98.1 F (36.7 C) (Oral)   Ht 6\' 5"  (1.956 m)   Wt 202 lb (91.6 kg)   SpO2 99%   BMI 23.95 kg/m   Wt Readings from Last 3 Encounters:  04/28/19 202 lb (91.6 kg)  02/07/19 193 lb (87.5 kg)  02/01/19 193 lb (87.5 kg)    Physical Exam Vitals and nursing note reviewed.  Constitutional:      General: He is not in acute distress.    Appearance: He is well-developed.  HENT:     Head: Atraumatic.     Right Ear: Tympanic membrane and external ear normal.     Left Ear: Tympanic membrane and external ear normal.     Nose: Nose normal.     Mouth/Throat:     Mouth: Mucous membranes are moist.     Pharynx: Oropharynx is clear.  Eyes:     General: No scleral icterus.    Conjunctiva/sclera: Conjunctivae normal.     Pupils: Pupils are equal, round, and reactive to light.  Cardiovascular:     Rate and Rhythm: Normal rate and regular rhythm.     Heart sounds: Normal heart sounds. No  murmur.  Pulmonary:     Effort: Pulmonary effort is normal. No respiratory distress.     Breath sounds: Normal breath sounds.  Abdominal:     General: Bowel sounds are normal. There is no distension.     Palpations: Abdomen is soft. There is no mass.     Tenderness: There is no abdominal tenderness. There is no guarding.  Genitourinary:    Comments:  GU exam declined Musculoskeletal:        General: No tenderness. Normal range of motion.     Cervical back: Normal range of motion and neck supple.  Skin:    General: Skin is warm and dry.     Findings: No rash.  Neurological:     General: No focal deficit present.     Mental Status: He is alert and oriented to person, place, and time.     Deep Tendon Reflexes: Reflexes are normal and symmetric.  Psychiatric:        Mood and Affect: Mood normal.        Behavior: Behavior normal.        Thought Content: Thought content normal.        Judgment: Judgment normal.     Results for orders placed or performed in visit on 02/14/19  Hepatitis B e antibody  Result Value Ref Range   Hep B E Ab Positive (A) Negative  Hepatitis B Core Antibody, total  Result Value Ref Range   Hep B Core Total Ab Positive (A) Negative  Hepatitis B DNA, ultraquantitative, PCR  Result Value Ref Range   HBV DNA SERPL PCR-ACNC <10 IU/mL   HBV DNA SERPL PCR-LOG IU CANCELED log10 IU/mL   Test Information: Comment       Assessment & Plan:   Problem List Items Addressed This Visit      Cardiovascular and Mediastinum   Hypertension   Relevant Orders   CBC with Differential/Platelet   Comprehensive metabolic panel   UA/M w/rflx Culture, Routine   Aortic atherosclerosis (HCC)     Other   Hypercholesteremia   Relevant Orders   Lipid Panel w/o Chol/HDL Ratio    Other Visit Diagnoses    Annual physical exam    -  Primary   Screening for prostate cancer       Relevant Orders   PSA       Discussed aspirin prophylaxis for myocardial infarction  prevention and decision was made to continue ASA  LABORATORY TESTING:  Health maintenance labs ordered today as discussed above.   The natural history of prostate cancer and ongoing controversy regarding screening and potential treatment outcomes of prostate cancer has been discussed with the patient. The meaning of a false positive PSA and a false negative PSA has been discussed. He indicates understanding of the limitations of this screening test and wishes to proceed with screening PSA testing.   IMMUNIZATIONS:   - Tdap: Tetanus vaccination status reviewed: last tetanus booster within 10 years. - Influenza: Refused  SCREENING: - Colonoscopy: Up to date  Discussed with patient purpose of the colonoscopy is to detect colon cancer at curable precancerous or early stages   PATIENT COUNSELING:    Sexuality: Discussed sexually transmitted diseases, partner selection, use of condoms, avoidance of unintended pregnancy  and contraceptive alternatives.   Advised to avoid cigarette smoking.  I discussed with the patient that most people either abstain from alcohol or drink within safe limits (<=14/week and <=4 drinks/occasion for males, <=7/weeks and <= 3 drinks/occasion for females) and that the risk for alcohol disorders and other health effects rises proportionally with the number of drinks per week and how often a drinker exceeds daily limits.  Discussed cessation/primary prevention of drug use and availability of treatment for abuse.   Diet: Encouraged to adjust caloric intake to maintain  or achieve ideal body weight, to reduce intake of dietary saturated fat and total fat, to limit sodium  intake by avoiding high sodium foods and not adding table salt, and to maintain adequate dietary potassium and calcium preferably from fresh fruits, vegetables, and low-fat dairy products.    stressed the importance of regular exercise  Injury prevention: Discussed safety belts, safety helmets, smoke  detector, smoking near bedding or upholstery.   Dental health: Discussed importance of regular tooth brushing, flossing, and dental visits.   Follow up plan: NEXT PREVENTATIVE PHYSICAL DUE IN 1 YEAR. Return in about 6 months (around 10/26/2019) for 6 month f/u.

## 2019-04-29 LAB — COMPREHENSIVE METABOLIC PANEL
ALT: 14 IU/L (ref 0–44)
AST: 15 IU/L (ref 0–40)
Albumin/Globulin Ratio: 1.6 (ref 1.2–2.2)
Albumin: 4.4 g/dL (ref 3.8–4.9)
Alkaline Phosphatase: 89 IU/L (ref 39–117)
BUN/Creatinine Ratio: 8 — ABNORMAL LOW (ref 9–20)
BUN: 8 mg/dL (ref 6–24)
Bilirubin Total: <0.2 mg/dL (ref 0.0–1.2)
CO2: 23 mmol/L (ref 20–29)
Calcium: 9.6 mg/dL (ref 8.7–10.2)
Chloride: 104 mmol/L (ref 96–106)
Creatinine, Ser: 1.03 mg/dL (ref 0.76–1.27)
GFR calc Af Amer: 93 mL/min/{1.73_m2} (ref >59)
GFR calc non Af Amer: 80 mL/min/{1.73_m2} (ref >59)
Globulin, Total: 2.8 g/dL (ref 1.5–4.5)
Glucose: 76 mg/dL (ref 65–99)
Potassium: 4.2 mmol/L (ref 3.5–5.2)
Sodium: 141 mmol/L (ref 134–144)
Total Protein: 7.2 g/dL (ref 6.0–8.5)

## 2019-04-29 LAB — CBC WITH DIFFERENTIAL/PLATELET
Basophils Absolute: 0.1 10*3/uL (ref 0.0–0.2)
Basos: 1 %
EOS (ABSOLUTE): 0.2 10*3/uL (ref 0.0–0.4)
Eos: 3 %
Hematocrit: 42.8 % (ref 37.5–51.0)
Hemoglobin: 14.2 g/dL (ref 13.0–17.7)
Immature Grans (Abs): 0 10*3/uL (ref 0.0–0.1)
Immature Granulocytes: 0 %
Lymphocytes Absolute: 1.7 10*3/uL (ref 0.7–3.1)
Lymphs: 27 %
MCH: 29.3 pg (ref 26.6–33.0)
MCHC: 33.2 g/dL (ref 31.5–35.7)
MCV: 88 fL (ref 79–97)
Monocytes Absolute: 0.6 10*3/uL (ref 0.1–0.9)
Monocytes: 10 %
Neutrophils Absolute: 3.7 10*3/uL (ref 1.4–7.0)
Neutrophils: 59 %
Platelets: 261 10*3/uL (ref 150–450)
RBC: 4.84 x10E6/uL (ref 4.14–5.80)
RDW: 12.7 % (ref 11.6–15.4)
WBC: 6.3 10*3/uL (ref 3.4–10.8)

## 2019-04-29 LAB — LIPID PANEL W/O CHOL/HDL RATIO
Cholesterol, Total: 213 mg/dL — ABNORMAL HIGH (ref 100–199)
HDL: 42 mg/dL (ref >39)
LDL Chol Calc (NIH): 144 mg/dL — ABNORMAL HIGH (ref 0–99)
Triglycerides: 152 mg/dL — ABNORMAL HIGH (ref 0–149)
VLDL Cholesterol Cal: 27 mg/dL (ref 5–40)

## 2019-04-29 LAB — PSA: Prostate Specific Ag, Serum: 1.3 ng/mL (ref 0.0–4.0)

## 2019-05-18 ENCOUNTER — Encounter: Payer: Self-pay | Admitting: Gastroenterology

## 2019-05-18 ENCOUNTER — Ambulatory Visit: Payer: Medicaid Other | Admitting: Gastroenterology

## 2019-05-18 ENCOUNTER — Other Ambulatory Visit: Payer: Self-pay

## 2019-05-18 VITALS — BP 129/79 | HR 80 | Temp 97.9°F | Ht 73.0 in | Wt 203.0 lb

## 2019-05-18 DIAGNOSIS — R768 Other specified abnormal immunological findings in serum: Secondary | ICD-10-CM

## 2019-05-18 NOTE — Progress Notes (Signed)
   Charles Mood MD, MRCP(U.K) [address]  [address]  [place], [place] [address]  Main: [phone]  Fax: [phone]   Primary Care Physician: Steele Sizer, MD  Primary Gastroenterologist:  Dr. Wyline Lamb    Follow up for abnormal hepatitis B serology   HPI: Charles Lamb is a 59 y.o. male     Summary of history :  Initially referred and seen on 02/01/2019 for B core total antibody that is positive.  As per the lab result on 12/14/2018.  Hepatitis C virus antibody is negative.  HIV antibody was negative in November 2019.  CMP completely normal in 12/04/2018.  CBC normal in 12/04/2018.  He states that he last used cocaine back in the year 2000, has been in prison previously.  No meds prior [employer].  Denies any tattoos.  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  Sexually active with the opposite sex same partner for many years.  Does not recall being treated for hepatitis B in the past.  Never had a colonoscopy for colon cancer screening.  No family history of colon cancer or polyps  Interval history   02/01/2019-05/18/2019  02/14/2019: Hep B viral load- negative , Hep B e ab , Hep B core ab , positive    Doing well no other complaints  Current Outpatient Medications  Medication Sig Dispense Refill  . aspirin EC 325 MG EC tablet Take 1 tablet (325 mg total) by mouth daily. 30 tablet 1   No current facility-administered medications for this visit.    Allergies as of 05/18/2019  . (No Known Allergies)    ROS:  General: Negative for anorexia, weight loss, fever, chills, fatigue, weakness. ENT: Negative for hoarseness, difficulty swallowing , nasal congestion. CV: Negative for chest pain, angina, palpitations, dyspnea on exertion, peripheral edema.  Respiratory: Negative for dyspnea at rest, dyspnea on exertion, cough, sputum, wheezing.  GI: See history of present illness. GU:  Negative for dysuria, hematuria, urinary incontinence,  urinary frequency, nocturnal urination.  Endo: Negative for unusual weight change.    Physical Examination:   There were no vitals taken for this visit.  General: Well-nourished, well-developed in no acute distress.  Eyes: No icterus. Conjunctivae pink. Psych: Alert and cooperative, normal Lamb and affect.   Imaging Studies: No results found.  Assessment and Plan:   [name] is a 59 y.o. y/o male here to follow up  for a positive hepatitis B core total antibody. His labs with positive Hep B e ab , core ab  Suggest that he had been infected with hepatitis B but appears to have recently cleared it by his own immune system- I suggest we repeat labs in 3 months for Hep B surface antigen, antibody, Core total antibody , virus DNA level,LFT's . Follow up after that in my office   Dr Charles Mood  MD,MRCP [facility]) Follow up in 4 months telephone visit

## 2019-08-15 ENCOUNTER — Telehealth: Payer: Self-pay

## 2019-08-15 ENCOUNTER — Other Ambulatory Visit: Payer: Self-pay

## 2019-08-15 DIAGNOSIS — R768 Other specified abnormal immunological findings in serum: Secondary | ICD-10-CM

## 2019-08-15 NOTE — Telephone Encounter (Signed)
Spoke with pt and reminded him that he's due for repeat hepatitis labs. Pt agrees and plans to visit our office lab this week.

## 2019-08-15 NOTE — Telephone Encounter (Signed)
-----   Message from Isa Rankin, CMA sent at 05/19/2019 11:12 AM EST ----- Regarding: Pt due for labs Pt is due for repeat Hep B labs this month. Already ordered.

## 2019-08-16 DIAGNOSIS — R768 Other specified abnormal immunological findings in serum: Secondary | ICD-10-CM | POA: Diagnosis not present

## 2019-08-18 LAB — HEPATITIS B CORE ANTIBODY, TOTAL: Hep B Core Total Ab: POSITIVE — AB

## 2019-08-18 LAB — HEPATITIS B DNA, ULTRAQUANTITATIVE, PCR: HBV DNA SERPL PCR-ACNC: NOT DETECTED IU/mL

## 2019-08-18 LAB — COMPREHENSIVE METABOLIC PANEL
ALT: 17 IU/L (ref 0–44)
AST: 19 IU/L (ref 0–40)
Albumin/Globulin Ratio: 1.5 (ref 1.2–2.2)
Albumin: 4.4 g/dL (ref 3.8–4.9)
Alkaline Phosphatase: 92 IU/L (ref 48–121)
BUN/Creatinine Ratio: 11 (ref 9–20)
BUN: 13 mg/dL (ref 6–24)
Bilirubin Total: <0.2 mg/dL (ref 0.0–1.2)
CO2: 20 mmol/L (ref 20–29)
Calcium: 9.3 mg/dL (ref 8.7–10.2)
Chloride: 104 mmol/L (ref 96–106)
Creatinine, Ser: 1.14 mg/dL (ref 0.76–1.27)
GFR calc Af Amer: 82 mL/min/{1.73_m2} (ref >59)
GFR calc non Af Amer: 71 mL/min/{1.73_m2} (ref >59)
Globulin, Total: 2.9 g/dL (ref 1.5–4.5)
Glucose: 107 mg/dL — ABNORMAL HIGH (ref 65–99)
Potassium: 4.1 mmol/L (ref 3.5–5.2)
Sodium: 138 mmol/L (ref 134–144)
Total Protein: 7.3 g/dL (ref 6.0–8.5)

## 2019-08-18 LAB — HEPATITIS B E ANTIGEN: Hep B E Ag: NEGATIVE

## 2019-08-18 LAB — HEPATITIS B SURFACE ANTIGEN: Hepatitis B Surface Ag: NEGATIVE

## 2019-09-05 ENCOUNTER — Telehealth: Payer: Self-pay

## 2019-09-05 NOTE — Telephone Encounter (Signed)
-----   Message from Wyline Mood, MD sent at 09/04/2019  2:26 PM EDT ----- No hepatitis B in blood

## 2019-09-05 NOTE — Telephone Encounter (Signed)
Spoke with pt and informed him of lab results. 

## 2019-10-26 ENCOUNTER — Ambulatory Visit: Payer: Medicaid Other | Admitting: Family Medicine

## 2019-10-26 ENCOUNTER — Other Ambulatory Visit: Payer: Self-pay

## 2019-10-26 ENCOUNTER — Encounter: Payer: Self-pay | Admitting: Family Medicine

## 2019-10-26 VITALS — BP 116/71 | HR 79 | Temp 98.7°F | Wt 202.0 lb

## 2019-10-26 DIAGNOSIS — I700 Atherosclerosis of aorta: Secondary | ICD-10-CM | POA: Diagnosis not present

## 2019-10-26 DIAGNOSIS — I10 Essential (primary) hypertension: Secondary | ICD-10-CM

## 2019-10-26 DIAGNOSIS — K625 Hemorrhage of anus and rectum: Secondary | ICD-10-CM | POA: Diagnosis not present

## 2019-10-26 NOTE — Assessment & Plan Note (Signed)
BPs stable and under good control off medications, continue lifestyle modifications with close monitoring

## 2019-10-26 NOTE — Assessment & Plan Note (Signed)
Recheck lipids, continue lifestyle modifications 

## 2019-10-26 NOTE — Progress Notes (Signed)
BP 116/71   Pulse 79   Temp 98.7 F (37.1 C) (Oral)   Wt 202 lb (91.6 kg)   SpO2 97%   BMI 26.65 kg/m    Subjective:    Patient ID: Charles Lamb, male    DOB: [birth year], 59 y.o.   MRN: [id number]  HPI: Charles Lamb is a 58 y.o. male  Chief Complaint  Patient presents with  . Hypertension  . Blood In Stools    pt states noticed this for about 3 days, a month ago   Here today for 6 month f/u chronic conditions.   Had a day last month where he had bowel urgency for several days with bright red blood in his stools last month. Resolved spontaneously and has not had any recurrences. Did not have any abdominal pain, rectal pain, N/V/D, constipation, fevers. Recent colonoscopy WNL 2020, followed by Dr. Tobi Bastos.   HTN, HLD, aortic atherosclerosis - diet controlled, not tracking BPs at home. Exercises often. Denies CP, SOB, HAs, claudication.   Relevant past medical, surgical, family and social history reviewed and updated as indicated. Interim medical history since our last visit reviewed. Allergies and medications reviewed and updated.  Review of Systems  Per HPI unless specifically indicated above     Objective:    BP 116/71   Pulse 79   Temp 98.7 F (37.1 C) (Oral)   Wt 202 lb (91.6 kg)   SpO2 97%   BMI 26.65 kg/m   Wt Readings from Last 3 Encounters:  10/26/19 202 lb (91.6 kg)  05/18/19 203 lb (92.1 kg)  04/28/19 202 lb (91.6 kg)    Physical Exam Vitals and nursing note reviewed.  Constitutional:      Appearance: Normal appearance.  HENT:     Head: Atraumatic.  Eyes:     Extraocular Movements: Extraocular movements intact.     Conjunctiva/sclera: Conjunctivae normal.  Cardiovascular:     Rate and Rhythm: Normal rate and regular rhythm.  Pulmonary:     Effort: Pulmonary effort is normal.     Breath sounds: Normal breath sounds.  Abdominal:     General: Bowel sounds are normal. There is no distension.     Palpations: Abdomen is soft.      Tenderness: There is no abdominal tenderness. There is no right CVA tenderness, left CVA tenderness or guarding.  Genitourinary:    Comments: Declines rectal exam Musculoskeletal:        General: Normal range of motion.     Cervical back: Normal range of motion and neck supple.  Skin:    General: Skin is warm and dry.  Neurological:     General: No focal deficit present.     Mental Status: He is oriented to person, place, and time.  Psychiatric:        Mood and Affect: Mood normal.        Thought Content: Thought content normal.        Judgment: Judgment normal.     Results for orders placed or performed in visit on 08/15/19  Hepatitis B DNA, ultraquantitative, PCR  Result Value Ref Range   HBV DNA SERPL PCR-ACNC HBV DNA not detected IU/mL   HBV DNA SERPL PCR-LOG IU CANCELED log10 IU/mL   Test Information: Comment   Comprehensive metabolic panel  Result Value Ref Range   Glucose 107 (H) 65 - 99 mg/dL   BUN 13 6 - 24 mg/dL   Creatinine, Ser 5.40 0.76 - 1.27 mg/dL  GFR calc non Af Amer 71 >59 mL/min/1.73   GFR calc Af Amer 82 >59 mL/min/1.73   BUN/Creatinine Ratio 11 9 - 20   Sodium 138 134 - 144 mmol/L   Potassium 4.1 3.5 - 5.2 mmol/L   Chloride 104 96 - 106 mmol/L   CO2 20 20 - 29 mmol/L   Calcium 9.3 8.7 - 10.2 mg/dL   Total Protein 7.3 6.0 - 8.5 g/dL   Albumin 4.4 3.8 - 4.9 g/dL   Globulin, Total 2.9 1.5 - 4.5 g/dL   Albumin/Globulin Ratio 1.5 1.2 - 2.2   Bilirubin Total <0.2 0.0 - 1.2 mg/dL   Alkaline Phosphatase 92 48 - 121 IU/L   AST 19 0 - 40 IU/L   ALT 17 0 - 44 IU/L  Hepatitis B surface antigen  Result Value Ref Range   Hepatitis B Surface Ag Negative Negative  Hepatitis B e antigen  Result Value Ref Range   Hep B E Ag Negative Negative  Hepatitis B core antibody, total  Result Value Ref Range   Hep B Core Total Ab Positive (A) Negative      Assessment & Plan:   Problem List Items Addressed This Visit      Cardiovascular and Mediastinum    Hypertension - Primary    BPs stable and under good control off medications, continue lifestyle modifications with close monitoring      Relevant Orders   CBC with Differential/Platelet   Comprehensive metabolic panel   Aortic atherosclerosis (HCC)    Recheck lipids, continue lifestyle modifications      Relevant Orders   Lipid Panel w/o Chol/HDL Ratio    Other Visit Diagnoses    Rectal bleeding       Resolved spontaneously, possibly some internal hemorrhoids. Avoid straining, f/u with GI if recurring. UTD on colonoscopy (2020)       Follow up plan: Return in about 6 months (around 04/27/2020) for 6 month f/u.

## 2019-10-27 LAB — CBC WITH DIFFERENTIAL/PLATELET
Basophils Absolute: 0 10*3/uL (ref 0.0–0.2)
Basos: 1 %
EOS (ABSOLUTE): 0.2 10*3/uL (ref 0.0–0.4)
Eos: 3 %
Hematocrit: 39.4 % (ref 37.5–51.0)
Hemoglobin: 13 g/dL (ref 13.0–17.7)
Immature Grans (Abs): 0 10*3/uL (ref 0.0–0.1)
Immature Granulocytes: 1 %
Lymphocytes Absolute: 1.9 10*3/uL (ref 0.7–3.1)
Lymphs: 30 %
MCH: 28.9 pg (ref 26.6–33.0)
MCHC: 33 g/dL (ref 31.5–35.7)
MCV: 88 fL (ref 79–97)
Monocytes Absolute: 0.7 10*3/uL (ref 0.1–0.9)
Monocytes: 11 %
Neutrophils Absolute: 3.5 10*3/uL (ref 1.4–7.0)
Neutrophils: 54 %
Platelets: 232 10*3/uL (ref 150–450)
RBC: 4.5 x10E6/uL (ref 4.14–5.80)
RDW: 13 % (ref 11.6–15.4)
WBC: 6.3 10*3/uL (ref 3.4–10.8)

## 2019-10-27 LAB — COMPREHENSIVE METABOLIC PANEL
ALT: 18 IU/L (ref 0–44)
AST: 19 IU/L (ref 0–40)
Albumin/Globulin Ratio: 1.5 (ref 1.2–2.2)
Albumin: 4.1 g/dL (ref 3.8–4.9)
Alkaline Phosphatase: 87 IU/L (ref 48–121)
BUN/Creatinine Ratio: 9 (ref 9–20)
BUN: 10 mg/dL (ref 6–24)
Bilirubin Total: <0.2 mg/dL (ref 0.0–1.2)
CO2: 20 mmol/L (ref 20–29)
Calcium: 9 mg/dL (ref 8.7–10.2)
Chloride: 104 mmol/L (ref 96–106)
Creatinine, Ser: 1.12 mg/dL (ref 0.76–1.27)
GFR calc Af Amer: 84 mL/min/{1.73_m2} (ref >59)
GFR calc non Af Amer: 73 mL/min/{1.73_m2} (ref >59)
Globulin, Total: 2.8 g/dL (ref 1.5–4.5)
Glucose: 93 mg/dL (ref 65–99)
Potassium: 4.1 mmol/L (ref 3.5–5.2)
Sodium: 139 mmol/L (ref 134–144)
Total Protein: 6.9 g/dL (ref 6.0–8.5)

## 2019-10-27 LAB — LIPID PANEL W/O CHOL/HDL RATIO
Cholesterol, Total: 223 mg/dL — ABNORMAL HIGH (ref 100–199)
HDL: 42 mg/dL (ref >39)
LDL Chol Calc (NIH): 146 mg/dL — ABNORMAL HIGH (ref 0–99)
Triglycerides: 195 mg/dL — ABNORMAL HIGH (ref 0–149)
VLDL Cholesterol Cal: 35 mg/dL (ref 5–40)

## 2019-11-01 ENCOUNTER — Telehealth: Payer: Self-pay | Admitting: Family Medicine

## 2019-11-01 MED ORDER — ROSUVASTATIN CALCIUM 10 MG PO TABS
10.0000 mg | ORAL_TABLET | Freq: Every day | ORAL | 1 refills | Status: DC
Start: 2019-11-01 — End: 2020-05-06

## 2019-11-01 NOTE — Telephone Encounter (Signed)
Routing to provider to advise. No medication listed in chart.

## 2019-11-01 NOTE — Addendum Note (Signed)
Addended by: Roosvelt Maser E on: 11/01/2019 11:55 AM   Modules accepted: Orders

## 2019-11-01 NOTE — Telephone Encounter (Signed)
I hadn't gotten it sent in yet, it's in now

## 2019-11-01 NOTE — Telephone Encounter (Signed)
Copied from CRM [phone]. Topic: General - Inquiry >> Nov 01, 2019  9:14 AM Deborha Payment wrote: Reason for CRM: Patient is stating [facility] [address] [facility] is suppose to start patient on new medication for his cholesterol.  Patient states he has not heard anything from pharmacy. Call back [phone]

## 2020-02-21 ENCOUNTER — Telehealth: Payer: Self-pay | Admitting: Gastroenterology

## 2020-02-21 NOTE — Telephone Encounter (Signed)
Error

## 2020-04-09 ENCOUNTER — Other Ambulatory Visit: Payer: Self-pay

## 2020-04-09 ENCOUNTER — Encounter: Payer: Self-pay | Admitting: Gastroenterology

## 2020-04-09 ENCOUNTER — Telehealth: Payer: Self-pay | Admitting: Gastroenterology

## 2020-04-09 ENCOUNTER — Ambulatory Visit (INDEPENDENT_AMBULATORY_CARE_PROVIDER_SITE_OTHER): Payer: Medicaid Other | Admitting: Gastroenterology

## 2020-04-09 VITALS — BP 149/84 | HR 81 | Ht 73.0 in | Wt 202.0 lb

## 2020-04-09 DIAGNOSIS — K625 Hemorrhage of anus and rectum: Secondary | ICD-10-CM

## 2020-04-09 MED ORDER — HYDROCORTISONE ACETATE 25 MG RE SUPP
25.0000 mg | Freq: Every evening | RECTAL | 0 refills | Status: DC
Start: 2020-04-09 — End: 2020-04-09

## 2020-04-09 MED ORDER — HYDROCORTISONE (PERIANAL) 2.5 % EX CREA: 1.0000 "application " | TOPICAL_CREAM | Freq: Two times a day (BID) | CUTANEOUS | 0 refills | Status: DC

## 2020-04-09 NOTE — Telephone Encounter (Signed)
New prescription has been sent in.  

## 2020-04-09 NOTE — Progress Notes (Signed)
   Wyline Mood MD, MRCP(U.K) [address]  [address]  [place], [place] [address]  Main: [phone]  Fax: [phone]   Primary Care Physician: Steele Sizer, MD  Primary Gastroenterologist:  Dr. Wyline Mood    Blood in stool    HPI: Charles Lamb is a 60 y.o. male    Summary of history :  Initially referred and seen on 02/01/2019 for B core total antibody that is positive secondary to prior infection that he has cleared.  02/14/2019: Hep B viral load- negative , Hep B e ab , Hep B core ab , positive     Interval history 05/18/2019-04/09/2020  08/16/2019: Hep B core ab positive, HBV,Hep B e antigen,Bsag ,   not detected . LFT's normal .  02/07/2020: Colonoscopy : Diverticulosis of colon and internal hemorrhoids. Repeat colonoscopy in 10 years.    He says that for the past few months she has noticed blood on the tissue paper the toilet bowl every time he had a bowel movement. Some perianal itching. Not on any blood thinners except  aspirin. Denies any constipation. Diet is very poor in fiber.  Current Outpatient Medications  Medication Sig Dispense Refill  . aspirin EC 325 MG EC tablet Take 1 tablet (325 mg total) by mouth daily. 30 tablet 1  . rosuvastatin (CRESTOR) 10 MG tablet Take 1 tablet (10 mg total) by mouth daily. 90 tablet 1   No current facility-administered medications for this visit.    Allergies as of 04/09/2020  . (No Known Allergies)    ROS:  General: Negative for anorexia, weight loss, fever, chills, fatigue, weakness. ENT: Negative for hoarseness, difficulty swallowing , nasal congestion. CV: Negative for chest pain, angina, palpitations, dyspnea on exertion, peripheral edema.  Respiratory: Negative for dyspnea at rest, dyspnea on exertion, cough, sputum, wheezing.  GI: See history of present illness. GU:  Negative for dysuria, hematuria, urinary incontinence, urinary frequency, nocturnal urination.  Endo: Negative for unusual weight  change.    Physical Examination:   BP (!) 149/84 (BP Location: Left Arm, Patient Position: Sitting, Cuff Size: Normal)   Pulse 81   Ht 6\' 1"  (1.854 m)   Wt 202 lb (91.6 kg)   BMI 26.65 kg/m   General: Well-nourished, well-developed in no acute distress.  Eyes: No icterus. Conjunctivae pink. Neuro: Alert and oriented x 3.  Grossly intact. Skin: Warm and dry, no jaundice.   Psych: Alert and cooperative, normal mood and affect.   Imaging Studies: No results found.  Assessment and Plan:   [name] is a 60 y.o. y/o male here to see me for bleeding associated with every bowel movement. Bright red blood per rectum. Some perianal itching. Dietary poor in fiber. History of large internal hemorrhoids noted on screening colonoscopy November 2020. Since it has been over a year I will perform a flexible sigmoidoscopy to confirm bleeding hemorrhoids. I counseled him about high-fiber diet and patient information has been provided. We will give him a trial of Anusol suppositories for 5 days and if bleeding persists then may   I have discussed alternative options, risks & benefits,  which include, but are not limited to, bleeding, infection, perforation,respiratory complication & drug reaction.  The patient agrees with this plan & written consent will be obtained.   consider banding in the office  Dr December 2020  MD,MRCP [facility]) Follow up in 6 to 8 weeks

## 2020-04-09 NOTE — Telephone Encounter (Signed)
Pharmacy called to ask if patient can use Anusol cream with applicator instead of pill because his insurance covers that.

## 2020-04-09 NOTE — Patient Instructions (Signed)
Hemorrhoids Hemorrhoids are swollen veins that may develop:  In the butt (rectum). These are called internal hemorrhoids.  Around the opening of the butt (anus). These are called external hemorrhoids. Hemorrhoids can cause pain, itching, or bleeding. Most of the time, they do not cause serious problems. They usually get better with diet changes, lifestyle changes, and other home treatments. What are the causes? This condition may be caused by:  Having trouble pooping (constipation).  Pushing hard (straining) to poop.  Watery poop (diarrhea).  Pregnancy.  Being very overweight (obese).  Sitting for long periods of time.  Heavy lifting or other activity that causes you to strain.  Anal sex.  Riding a bike for a long period of time. What are the signs or symptoms? Symptoms of this condition include:  Pain.  Itching or soreness in the butt.  Bleeding from the butt.  Leaking poop.  Swelling in the area.  One or more lumps around the opening of your butt. How is this diagnosed? A doctor can often diagnose this condition by looking at the affected area. The doctor may also:  Do an exam that involves feeling the area with a gloved hand (digital rectal exam).  Examine the area inside your butt using a small tube (anoscope).  Order blood tests. This may be done if you have lost a lot of blood.  Have you get a test that involves looking inside the colon using a flexible tube with a camera on the end (sigmoidoscopy or colonoscopy). How is this treated? This condition can usually be treated at home. Your doctor may tell you to change what you eat, make lifestyle changes, or try home treatments. If these do not help, procedures can be done to remove the hemorrhoids or make them smaller. These may involve:  Placing rubber bands at the base of the hemorrhoids to cut off their blood supply.  Injecting medicine into the hemorrhoids to shrink them.  Shining a type of light  energy onto the hemorrhoids to cause them to fall off.  Doing surgery to remove the hemorrhoids or cut off their blood supply. Follow these instructions at home: Eating and drinking  Eat foods that have a lot of fiber in them. These include whole grains, beans, nuts, fruits, and vegetables.  Ask your doctor about taking products that have added fiber (fibersupplements).  Reduce the amount of fat in your diet. You can do this by: ? Eating low-fat dairy products. ? Eating less red meat. ? Avoiding processed foods.  Drink enough fluid to keep your pee (urine) pale yellow.   Managing pain and swelling  Take a warm-water bath (sitz bath) for 20 minutes to ease pain. Do this 3-4 times a day. You may do this in a bathtub or using a portable sitz bath that fits over the toilet.  If told, put ice on the painful area. It may be helpful to use ice between your warm baths. ? Put ice in a plastic bag. ? Place a towel between your skin and the bag. ? Leave the ice on for 20 minutes, 2-3 times a day.   General instructions  Take over-the-counter and prescription medicines only as told by your doctor. ? Medicated creams and medicines may be used as told.  Exercise often. Ask your doctor how much and what kind of exercise is best for you.  Go to the bathroom when you have the urge to poop. Do not wait.  Avoid pushing too hard when you poop.    Keep your butt dry and clean. Use wet toilet paper or moist towelettes after pooping.  Do not sit on the toilet for a long time.  Keep all follow-up visits as told by your doctor. This is important. Contact a doctor if you:  Have pain and swelling that do not get better with treatment or medicine.  Have trouble pooping.  Cannot poop.  Have pain or swelling outside the area of the hemorrhoids. Get help right away if you have:  Bleeding that will not stop. Summary  Hemorrhoids are swollen veins in the butt or around the opening of the  butt.  They can cause pain, itching, or bleeding.  Eat foods that have a lot of fiber in them. These include whole grains, beans, nuts, fruits, and vegetables.  Take a warm-water bath (sitz bath) for 20 minutes to ease pain. Do this 3-4 times a day. This information is not intended to replace advice given to you by your health care provider. Make sure you discuss any questions you have with your health care provider. Document Revised: 03/10/2018 Document Reviewed: 07/22/2017 Elsevier Patient Education  2021 Elsevier Inc.  

## 2020-04-17 ENCOUNTER — Other Ambulatory Visit: Payer: Self-pay

## 2020-04-17 ENCOUNTER — Telehealth: Payer: Self-pay | Admitting: Gastroenterology

## 2020-04-17 MED ORDER — NA SULFATE-K SULFATE-MG SULF 17.5-3.13-1.6 GM/177ML PO SOLN: 1.0000 | Freq: Once | ORAL | 0 refills | Status: AC

## 2020-04-17 NOTE — Telephone Encounter (Signed)
Patient has questions regarding procedure on 04/30/20.  Please call to advise

## 2020-04-17 NOTE — Telephone Encounter (Signed)
Returned patients call. Sent bowel prep to pharmacy of choice. Pt verbalized understanding.

## 2020-04-26 ENCOUNTER — Other Ambulatory Visit: Payer: Self-pay

## 2020-04-26 ENCOUNTER — Other Ambulatory Visit
Admission: RE | Admit: 2020-04-26 | Discharge: 2020-04-26 | Disposition: A | Discharge status: Home / Self Care | Payer: Medicaid Other | Source: Ambulatory Visit | Attending: Gastroenterology | Admitting: Gastroenterology

## 2020-04-26 DIAGNOSIS — Z20822 Contact with and (suspected) exposure to covid-19: Secondary | ICD-10-CM | POA: Diagnosis not present

## 2020-04-26 DIAGNOSIS — Z01812 Encounter for preprocedural laboratory examination: Secondary | ICD-10-CM | POA: Insufficient documentation

## 2020-04-27 LAB — SARS CORONAVIRUS 2 (TAT 6-24 HRS): SARS Coronavirus 2: NEGATIVE

## 2020-04-30 ENCOUNTER — Ambulatory Visit
Admission: RE | Admit: 2020-04-30 | Discharge: 2020-04-30 | Disposition: A | Discharge status: Home / Self Care | Payer: Medicaid Other | Attending: Gastroenterology | Admitting: Gastroenterology

## 2020-04-30 ENCOUNTER — Other Ambulatory Visit: Payer: Self-pay

## 2020-04-30 ENCOUNTER — Ambulatory Visit: Payer: Medicaid Other | Admitting: Registered Nurse

## 2020-04-30 ENCOUNTER — Encounter
Admission: RE | Disposition: A | Discharge status: Home / Self Care | Payer: Self-pay | Source: Home / Self Care | Attending: Gastroenterology

## 2020-04-30 ENCOUNTER — Encounter: Payer: Self-pay | Admitting: Gastroenterology

## 2020-04-30 ENCOUNTER — Ambulatory Visit: Payer: Medicaid Other | Admitting: Nurse Practitioner

## 2020-04-30 DIAGNOSIS — F17210 Nicotine dependence, cigarettes, uncomplicated: Secondary | ICD-10-CM | POA: Diagnosis not present

## 2020-04-30 DIAGNOSIS — K5730 Diverticulosis of large intestine without perforation or abscess without bleeding: Secondary | ICD-10-CM | POA: Diagnosis not present

## 2020-04-30 DIAGNOSIS — Z79899 Other long term (current) drug therapy: Secondary | ICD-10-CM | POA: Insufficient documentation

## 2020-04-30 DIAGNOSIS — K625 Hemorrhage of anus and rectum: Secondary | ICD-10-CM

## 2020-04-30 DIAGNOSIS — K921 Melena: Secondary | ICD-10-CM | POA: Insufficient documentation

## 2020-04-30 DIAGNOSIS — K640 First degree hemorrhoids: Secondary | ICD-10-CM | POA: Insufficient documentation

## 2020-04-30 DIAGNOSIS — Z7982 Long term (current) use of aspirin: Secondary | ICD-10-CM | POA: Diagnosis not present

## 2020-04-30 DIAGNOSIS — K649 Unspecified hemorrhoids: Secondary | ICD-10-CM | POA: Diagnosis not present

## 2020-04-30 DIAGNOSIS — K5790 Diverticulosis of intestine, part unspecified, without perforation or abscess without bleeding: Secondary | ICD-10-CM | POA: Diagnosis not present

## 2020-04-30 HISTORY — PX: FLEXIBLE SIGMOIDOSCOPY: SHX5431

## 2020-04-30 SURGERY — SIGMOIDOSCOPY, FLEXIBLE
Anesthesia: General

## 2020-04-30 MED ORDER — PROPOFOL 10 MG/ML IV BOLUS
INTRAVENOUS | Status: DC | PRN
  Administered 2020-04-30: 90 mg via INTRAVENOUS

## 2020-04-30 MED ORDER — SODIUM CHLORIDE 0.9 % IV SOLN: INTRAVENOUS | Status: DC

## 2020-04-30 MED ORDER — PROPOFOL 500 MG/50ML IV EMUL
INTRAVENOUS | Status: DC | PRN
  Administered 2020-04-30: 150 ug/kg/min via INTRAVENOUS

## 2020-04-30 MED ORDER — LIDOCAINE HCL (CARDIAC) PF 100 MG/5ML IV SOSY
PREFILLED_SYRINGE | INTRAVENOUS | Status: DC | PRN
  Administered 2020-04-30: 40 mg via INTRAVENOUS

## 2020-04-30 MED ORDER — PROPOFOL 500 MG/50ML IV EMUL
INTRAVENOUS | Status: AC
  Filled 2020-04-30: qty 50

## 2020-04-30 NOTE — Op Note (Signed)
[facility] Gastroenterology Patient Name: Charles Lamb Procedure Date: 04/30/2020 10:08 AM MRN: [id number] Account #: [id number] Date of Birth: [birth year] Admit Type: Outpatient Age: 60 Room: [facility] ENDO ROOM 2 Gender: Male Note Status: Finalized Procedure:             Flexible Sigmoidoscopy Indications:           Hematochezia Providers:             Wyline Mood MD, MD Medicines:             Monitored Anesthesia Care Complications:         No immediate complications. Procedure:             Pre-Anesthesia Assessment:                        - Prior to the procedure, a History and Physical was                         performed, and patient medications, allergies and                         sensitivities were reviewed. The patient's tolerance                         of previous anesthesia was reviewed.                        - The risks and benefits of the procedure and the                         sedation options and risks were discussed with the                         patient. All questions were answered and informed                         consent was obtained.                        - ASA Grade Assessment: II - A patient with mild                         systemic disease.                        After obtaining informed consent, the scope was passed                         under direct vision. The Colonoscope was introduced                         through the anus and advanced to the the left                         transverse colon. The flexible sigmoidoscopy was                         accomplished with ease. The patient tolerated the  procedure well. The quality of the bowel preparation                         was excellent. Findings:      The perianal and digital rectal examinations were normal.      Multiple small-mouthed diverticula were found in the sigmoid colon.      Non-bleeding internal hemorrhoids were found during retroflexion  and       during perianal exam. The hemorrhoids were large and Grade I (internal       hemorrhoids that do not prolapse). Impression:            - Diverticulosis in the sigmoid colon.                        - Non-bleeding internal hemorrhoids.                        - No specimens collected. Recommendation:        - Use fiber, for example Citrucel, Fibercon, Konsyl or                         Metamucil.                        - Discharge patient to home (with escort).                        - Resume previous diet.                        - Return to my office in 1 week. Procedure Code(s):     --- Professional ---                        [phone], Sigmoidoscopy, flexible; diagnostic, including                         collection of specimen(s) by brushing or washing, when                         performed (separate procedure) Diagnosis Code(s):     --- Professional ---                        K64.0, First degree hemorrhoids                        K92.1, Melena (includes Hematochezia)                        K57.30, Diverticulosis of large intestine without                         perforation or abscess without bleeding CPT copyright 2019 American Medical Association. All rights reserved. The codes documented in this report are preliminary and upon coder review may  be revised to meet current compliance requirements. Wyline Mood, MD Wyline Mood MD, MD 04/30/2020 10:32:29 AM This report has been signed electronically. Number of Addenda: 0 Note Initiated On: 04/30/2020 10:08 AM Total Procedure Duration: 0 hours 8 minutes 23 seconds  Estimated Blood Loss:  Estimated blood loss: none.  [facility]

## 2020-04-30 NOTE — Anesthesia Preprocedure Evaluation (Signed)
Anesthesia Evaluation  Patient identified by MRN, date of birth, ID band Patient awake    Reviewed: Allergy & Precautions, H&P , NPO status , Patient's Chart, lab work & pertinent test results  History of Anesthesia Complications Negative for: history of anesthetic complications  Airway Mallampati: II  TM Distance: >3 FB     Dental   Pulmonary neg sleep apnea, neg COPD, Current Smoker,    breath sounds clear to auscultation       Cardiovascular hypertension, (-) angina(-) Past MI and (-) Cardiac Stents (-) dysrhythmias  Rhythm:regular Rate:Normal     Neuro/Psych negative neurological ROS  negative psych ROS   GI/Hepatic negative GI ROS, (+)     substance abuse  alcohol use, Hepatitis -, B  Endo/Other  negative endocrine ROS  Renal/GU   negative genitourinary   Musculoskeletal   Abdominal   Peds  Hematology negative hematology ROS (+)   Anesthesia Other Findings Past Medical History: No date: Hypertension No date: Renal disorder  Past Surgical History: 02/07/2019: COLONOSCOPY WITH PROPOFOL; N/A     Comment:  Procedure: COLONOSCOPY WITH PROPOFOL;  Surgeon: Wyline Mood, MD;  Location: [facility] ENDOSCOPY;  Service:               Gastroenterology;  Laterality: N/A; No date: EYE SURGERY; Left 05/25/2018: TOTAL SHOULDER ARTHROPLASTY; Left     Comment:  Procedure: LEFT SHOULDER HEMIARTHROPLASTY, BICEPS               TENODESIS;  Surgeon: Lyndle Herrlich, MD;  Location: [facility]              ORS;  Service: Orthopedics;  Laterality: Left;  BMI    Body Mass Index: 26.65 kg/m      Reproductive/Obstetrics negative OB ROS                             Anesthesia Physical Anesthesia Plan  ASA: II  Anesthesia Plan: General   Post-op Pain Management:    Induction:   PONV Risk Score and Plan: Propofol infusion and TIVA  Airway Management Planned:   Additional Equipment:    Intra-op Plan:   Post-operative Plan:   Informed Consent: I have reviewed the patients History and Physical, chart, labs and discussed the procedure including the risks, benefits and alternatives for the proposed anesthesia with the patient or authorized representative who has indicated his/her understanding and acceptance.     Dental Advisory Given  Plan Discussed with: Anesthesiologist, CRNA and Surgeon  Anesthesia Plan Comments:         Anesthesia Quick Evaluation

## 2020-04-30 NOTE — H&P (Signed)
Wyline Mood, MD [address], [place], [place], [address] [address], [address], [place], [place], [address] Phone: [phone]  Fax: [phone]  Primary Care Physician:  Loura Pardon, MD   Pre-Procedure History & Physical: HPI:  Charles Lamb is a 60 y.o. male is here for a flexible sigmoidoscopy    Past Medical History:  Diagnosis Date  . Hypertension   . Renal disorder     Past Surgical History:  Procedure Laterality Date  . COLONOSCOPY WITH PROPOFOL N/A 02/07/2019   Procedure: COLONOSCOPY WITH PROPOFOL;  Surgeon: Wyline Mood, MD;  Location: [facility] ENDOSCOPY;  Service: Gastroenterology;  Laterality: N/A;  . EYE SURGERY Left   . TOTAL SHOULDER ARTHROPLASTY Left 05/25/2018   Procedure: LEFT SHOULDER HEMIARTHROPLASTY, BICEPS TENODESIS;  Surgeon: Lyndle Herrlich, MD;  Location: [facility] ORS;  Service: Orthopedics;  Laterality: Left;    Prior to Admission medications   Medication Sig Start Date End Date Taking? Authorizing Provider  aspirin EC 325 MG EC tablet Take 1 tablet (325 mg total) by mouth daily. 05/27/18  Yes Altamese Cabal, PA-C  hydrocortisone (ANUSOL-HC) 2.5 % rectal cream Place 1 application rectally 2 (two) times daily. 04/09/20  Yes Wyline Mood, MD  rosuvastatin (CRESTOR) 10 MG tablet Take 1 tablet (10 mg total) by mouth daily. 11/01/19  Yes Particia Nearing, PA-C    Allergies as of 04/09/2020  . (No Known Allergies)    Family History  Problem Relation Age of Onset  . Kidney disease Mother   . Kidney disease Brother     Social History   Socioeconomic History  . Marital status: Single    Spouse name: Not on file  . Number of children: Not on file  . Years of education: Not on file  . Highest education level: Not on file  Occupational History  . Not on file  Tobacco Use  . Smoking status: Current Every Day Smoker    Packs/day: 0.25    Types: Cigarettes  . Smokeless tobacco: Never Used  Vaping Use  . Vaping Use: Never used   Substance and Sexual Activity  . Alcohol use: Not Currently    Comment: 1 40oz a day  . Drug use: Not Currently  . Sexual activity: Not on file  Other Topics Concern  . Not on file  Social History Narrative  . Not on file   Social Determinants of Health   Financial Resource Strain: Not on file  Food Insecurity: Not on file  Transportation Needs: Not on file  Physical Activity: Not on file  Stress: Not on file  Social Connections: Not on file  Intimate Partner Violence: Not on file    Review of Systems: See HPI, otherwise negative ROS  Physical Exam: BP 129/88   Pulse 70   Temp (!) 96.9 F (36.1 C) (Temporal)   Resp 18   Ht 6\' 1"  (1.854 m)   Wt 91.6 kg   SpO2 99%   BMI 26.65 kg/m  General:   Alert,  pleasant and cooperative in NAD Head:  Normocephalic and atraumatic. Neck:  Supple; no masses or thyromegaly. Lungs:  Clear throughout to auscultation, normal respiratory effort.    Heart:  +S1, +S2, Regular rate and rhythm, No edema. Abdomen:  Soft, nontender and nondistended. Normal bowel sounds, without guarding, and without rebound.   Neurologic:  Alert and  oriented x4;  grossly normal neurologically.  Impression/Plan: is here for a flexible sigmoidoscopy to be performed  for rectal bleeding   Risks, benefits, limitations, and alternatives regarding  colonoscopy have been reviewed with the patient.  Questions have been answered.  All parties agreeable.   Wyline Mood, MD  04/30/2020, 10:09 AM

## 2020-04-30 NOTE — Anesthesia Postprocedure Evaluation (Signed)
Anesthesia Post Note  Patient: Charles Lamb  Procedure(s) Performed: FLEXIBLE SIGMOIDOSCOPY (N/A )  Patient location during evaluation: PACU Anesthesia Type: General Level of consciousness: awake and alert Pain management: pain level controlled Vital Signs Assessment: post-procedure vital signs reviewed and stable Respiratory status: spontaneous breathing, nonlabored ventilation and respiratory function stable Cardiovascular status: blood pressure returned to baseline and stable Postop Assessment: no apparent nausea or vomiting Anesthetic complications: no   No complications documented.   Last Vitals:  Vitals:   04/30/20 1042 04/30/20 1052  BP: 122/86 125/90  Pulse:    Resp:    Temp:    SpO2:      Last Pain:  Vitals:   04/30/20 1052  TempSrc:   PainSc: 0-No pain                 Aurelio Brash Mette [place]

## 2020-04-30 NOTE — Transfer of Care (Signed)
Immediate Anesthesia Transfer of Care Note  Patient: Charles Lamb  Procedure(s) Performed: Procedure(s): FLEXIBLE SIGMOIDOSCOPY (N/A)  Patient Location: PACU and Endoscopy Unit  Anesthesia Type:General  Level of Consciousness: sedated  Airway & Oxygen Therapy: Patient Spontanous Breathing and Patient connected to nasal cannula oxygen  Post-op Assessment: Report given to RN and Post -op Vital signs reviewed and stable  Post vital signs: Reviewed and stable  Last Vitals:  Vitals:   04/30/20 0941 04/30/20 1032  BP: 129/88 112/77  Pulse: 70 78  Resp: 18 19  Temp: (!) 36.1 C 36.6 C  SpO2: 99% 100%    Complications: No apparent anesthesia complications

## 2020-04-30 NOTE — Anesthesia Procedure Notes (Signed)
Date/Time: 04/30/2020 10:16 AM Performed by: Stormy Fabian, CRNA Pre-anesthesia Checklist: Patient identified, Emergency Drugs available, Suction available and Patient being monitored Patient Re-evaluated:Patient Re-evaluated prior to induction Oxygen Delivery Method: Nasal cannula Induction Type: IV induction Dental Injury: Teeth and Oropharynx as per pre-operative assessment  Comments: Nasal cannula with etCO2 monitoring

## 2020-05-06 ENCOUNTER — Ambulatory Visit (INDEPENDENT_AMBULATORY_CARE_PROVIDER_SITE_OTHER): Payer: Medicaid Other | Admitting: Internal Medicine

## 2020-05-06 ENCOUNTER — Other Ambulatory Visit: Payer: Self-pay

## 2020-05-06 VITALS — BP 119/73 | HR 78 | Temp 98.3°F | Ht 72.6 in | Wt 204.0 lb

## 2020-05-06 DIAGNOSIS — R030 Elevated blood-pressure reading, without diagnosis of hypertension: Secondary | ICD-10-CM | POA: Diagnosis not present

## 2020-05-06 DIAGNOSIS — Z23 Encounter for immunization: Secondary | ICD-10-CM

## 2020-05-06 DIAGNOSIS — E785 Hyperlipidemia, unspecified: Secondary | ICD-10-CM | POA: Diagnosis not present

## 2020-05-06 MED ORDER — ROSUVASTATIN CALCIUM 10 MG PO TABS: 10.0000 mg | ORAL_TABLET | Freq: Every day | ORAL | 1 refills | Status: DC

## 2020-05-06 NOTE — Progress Notes (Signed)
BP 119/73   Pulse 78   Temp 98.3 F (36.8 C) (Oral)   Ht 6' 0.6" (1.844 m)   Wt 204 lb (92.5 kg)   SpO2 97%   BMI 27.21 kg/m    Subjective:    Patient ID: Charles Lamb, male    DOB: 1960-08-27, 60 y.o.   MRN: [id number]  HPI: Patient is a 60 year old African-American male with a past medical history of hypertension hyperlipidemia who is here for a follow-up  Hypertension This is a chronic problem. The current episode started more than 1 month ago. The problem has been gradually improving since onset. The problem is controlled. Pertinent negatives include no anxiety, blurred vision, chest pain, headaches, malaise/fatigue, neck pain, orthopnea, palpitations, peripheral edema, PND, shortness of breath or sweats. The current treatment provides mild improvement. There are no compliance problems.  There is no history of chronic renal disease.  Hyperlipidemia This is a chronic problem. The current episode started more than 1 year ago. He has no history of chronic renal disease, diabetes, hypothyroidism, liver disease, obesity or nephrotic syndrome. Pertinent negatives include no chest pain or shortness of breath. The current treatment provides mild improvement of lipids.    Charles Lamb is a 60 y.o. male With a past medical history of obesity Charles Lamb is a current provider wearing his is not failing attent No chief complaint on file.   Relevant past medical, surgical, family and social history reviewed and updated as indicated. Interim medical history since our last visit reviewed. Allergies and medications reviewed and updated.  Review of Systems  Constitutional: Negative for activity change, appetite change, chills, fatigue, fever and malaise/fatigue.  HENT: Negative for congestion.   Eyes: Negative for blurred vision and visual disturbance.  Respiratory: Negative for apnea, cough, chest tightness, shortness of breath and wheezing.   Cardiovascular: Negative for chest pain,  palpitations, orthopnea, leg swelling and PND.  Gastrointestinal: Negative for abdominal distention, abdominal pain, diarrhea and nausea.  Endocrine: Negative for cold intolerance, heat intolerance and polyuria.  Genitourinary: Negative for difficulty urinating, dysuria, frequency, hematuria and urgency.  Musculoskeletal: Negative for neck pain.  Skin: Negative for color change and rash.  Neurological: Negative for dizziness, speech difficulty, weakness, light-headedness, numbness and headaches.  Psychiatric/Behavioral: Negative for agitation, behavioral problems and confusion. The patient is not nervous/anxious.     Per HPI unless specifically indicated above     Objective:    There were no vitals taken for this visit.  Wt Readings from Last 3 Encounters:  04/30/20 202 lb (91.6 kg)  04/09/20 202 lb (91.6 kg)  10/26/19 202 lb (91.6 kg)    Physical Exam Vitals and nursing note reviewed.  Constitutional:      General: He is not in acute distress.    Appearance: Normal appearance. He is not ill-appearing or diaphoretic.  HENT:     Head: Normocephalic and atraumatic.     Right Ear: Tympanic membrane and external ear normal. There is no impacted cerumen.     Left Ear: External ear normal.     Nose: No congestion or rhinorrhea.     Mouth/Throat:     Pharynx: No oropharyngeal exudate or posterior oropharyngeal erythema.  Eyes:     Conjunctiva/sclera: Conjunctivae normal.     Pupils: Pupils are equal, round, and reactive to light.  Cardiovascular:     Rate and Rhythm: Normal rate. Rhythm irregular.     Heart sounds: No murmur heard. No friction rub. No gallop.   Pulmonary:  Effort: No respiratory distress.     Breath sounds: No stridor. No wheezing or rhonchi.  Chest:     Chest wall: No tenderness.  Abdominal:     General: Abdomen is flat. Bowel sounds are normal. There is no distension.     Palpations: Abdomen is soft. There is no mass.     Tenderness: There is no  abdominal tenderness. There is no guarding.     Hernia: No hernia is present.  Musculoskeletal:        General: No swelling, tenderness, deformity or signs of injury.     Cervical back: Normal range of motion and neck supple. No rigidity or tenderness.     Right lower leg: No edema.     Left lower leg: No edema.  Skin:    General: Skin is warm and dry.     Coloration: Skin is not jaundiced.     Findings: No erythema.  Neurological:     Mental Status: He is alert and oriented to person, place, and time.  Psychiatric:        Mood and Affect: Mood normal.        Behavior: Behavior normal.        Thought Content: Thought content normal.        Judgment: Judgment normal.     Results for orders placed or performed during the hospital encounter of 04/26/20  SARS CORONAVIRUS 2 (TAT 6-24 HRS) Nasopharyngeal Nasopharyngeal Swab   Specimen: Nasopharyngeal Swab  Result Value Ref Range   SARS Coronavirus 2 NEGATIVE NEGATIVE      Assessment & Plan:   Problem List Items Addressed This Visit   None   Visit Diagnoses    Need for pneumococcal vaccination    -  Primary       Follow up plan: Hypertension: Continue current meds.  Medication compliance emphasised. pt advised to keep Bp logs. Pt verbalised understanding of the same. Pt to have a low salt diet . Exercise to reach a goal of at least 150 mins a week.  lifestyle modifications explained and pt understands importance of the above.  Ho hepatitis B +ve seen and rx with Gi per last note : sees dr. Wyline Mood for such .  Pt was"  seen in  02/01/2019 forB core total antibody that is positive secondary to prior infection that he has cleared"   Hemmorrhoids ; stable per pt. Some bleeing continues sees Gi for a fu on such. S/p flex sig as below.  No follow-ups on file.  Health Maintenence :  PSA : Next visit  Cscope : 2/15 flex sig - diverticulosis and internal hemorrhoids Pneumonia vaccine : today pneumococcal 23.

## 2020-05-07 ENCOUNTER — Encounter: Payer: Self-pay | Admitting: Internal Medicine

## 2020-05-27 ENCOUNTER — Ambulatory Visit (INDEPENDENT_AMBULATORY_CARE_PROVIDER_SITE_OTHER): Payer: Medicaid Other | Admitting: Gastroenterology

## 2020-05-27 ENCOUNTER — Other Ambulatory Visit: Payer: Self-pay

## 2020-05-27 ENCOUNTER — Encounter: Payer: Self-pay | Admitting: Gastroenterology

## 2020-05-27 VITALS — BP 144/85 | HR 73 | Ht 72.0 in | Wt 202.4 lb

## 2020-05-27 DIAGNOSIS — K648 Other hemorrhoids: Secondary | ICD-10-CM | POA: Diagnosis not present

## 2020-05-27 DIAGNOSIS — K625 Hemorrhage of anus and rectum: Secondary | ICD-10-CM

## 2020-05-27 NOTE — Progress Notes (Signed)
   Wyline Mood MD, MRCP(U.K) [address]  [address]  [place], [place] [address]  Main: [phone]  Fax: [phone]   Primary Care Physician: Loura Pardon, MD  Primary Gastroenterologist:  Dr. Wyline Mood   Follow up for rectal bleeding secondary to internal hemorrhoids.   HPI: Charles Lamb is a 60 y.o. 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  Seen in January 2022 for blood on the tissue paper the toilet bowl every time he had a bowel movement. Some perianal itching. Not on any blood thinners except  aspirin. Denied  any constipation. Diet was very poor in   02/14/2019: Hep B viral load- negative , Hep B e ab , Hep B core ab , positive 08/16/2019: Hep B core ab positive, HBV,Hep B e antigen,Bsag ,   not detected . LFT's normal . 02/07/2020: Colonoscopy : Diverticulosis of colon and internal hemorrhoids. Repeat colonoscopy in 10 years.     Interval history 04/09/2020-05/27/2020   04/30/2020: Flexible sigmoidoscopy : Large internal hemorrhoids and diverticulosis of sigmoid colon seen .  Doing well since last visit.  No rectal bleeding.  On a high-fiber diet.  Denies any constipation.  Current Outpatient Medications  Medication Sig Dispense Refill  . aspirin EC 325 MG EC tablet Take 1 tablet (325 mg total) by mouth daily. 30 tablet 1  . hydrocortisone (ANUSOL-HC) 2.5 % rectal cream Place 1 application rectally 2 (two) times daily. 28 g 0  . rosuvastatin (CRESTOR) 10 MG tablet Take 1 tablet (10 mg total) by mouth daily. 90 tablet 1   No current facility-administered medications for this visit.    Allergies as of 05/27/2020  . (No Known Allergies)    ROS:  General: Negative for anorexia, weight loss, fever, chills, fatigue, weakness. ENT: Negative for hoarseness, difficulty swallowing , nasal congestion. CV: Negative for chest pain, angina, palpitations, dyspnea on exertion, peripheral edema.   Respiratory: Negative for dyspnea at rest, dyspnea on exertion, cough, sputum, wheezing.  GI: See history of present illness. GU:  Negative for dysuria, hematuria, urinary incontinence, urinary frequency, nocturnal urination.  Endo: Negative for unusual weight change.    Physical Examination:   BP (!) 144/85 (BP Location: Left Arm, Patient Position: Sitting, Cuff Size: Large)   Pulse 73   Ht 6' (1.829 m)   Wt 202 lb 6.4 oz (91.8 kg)   BMI 27.45 kg/m   General: Well-nourished, well-developed in no acute distress.  Eyes: No icterus. Conjunctivae pink. Neuro: Alert and oriented x 3.  Grossly intact. Skin: Warm and dry, no jaundice.   Psych: Alert and cooperative, normal mood and affect.   Imaging Studies: No results found.  Assessment and Plan:   Charles Lamb is a 60 y.o. y/o male here to see me for bleeding associated with every bowel movement secondary to internal hemorrhoids.  Discussed about high-fiber diet with names of 25 to 30 g/day, conservative management of internal hemorrhoids with he is adherent to.   Dr Wyline Mood  MD,MRCP [facility]) Follow up in as needed

## 2020-05-27 NOTE — Patient Instructions (Signed)
Hemorrhoids Hemorrhoids are swollen veins that may develop:  In the butt (rectum). These are called internal hemorrhoids.  Around the opening of the butt (anus). These are called external hemorrhoids. Hemorrhoids can cause pain, itching, or bleeding. Most of the time, they do not cause serious problems. They usually get better with diet changes, lifestyle changes, and other home treatments. What are the causes? This condition may be caused by:  Having trouble pooping (constipation).  Pushing hard (straining) to poop.  Watery poop (diarrhea).  Pregnancy.  Being very overweight (obese).  Sitting for long periods of time.  Heavy lifting or other activity that causes you to strain.  Anal sex.  Riding a bike for a long period of time. What are the signs or symptoms? Symptoms of this condition include:  Pain.  Itching or soreness in the butt.  Bleeding from the butt.  Leaking poop.  Swelling in the area.  One or more lumps around the opening of your butt. How is this diagnosed? A doctor can often diagnose this condition by looking at the affected area. The doctor may also:  Do an exam that involves feeling the area with a gloved hand (digital rectal exam).  Examine the area inside your butt using a small tube (anoscope).  Order blood tests. This may be done if you have lost a lot of blood.  Have you get a test that involves looking inside the colon using a flexible tube with a camera on the end (sigmoidoscopy or colonoscopy). How is this treated? This condition can usually be treated at home. Your doctor may tell you to change what you eat, make lifestyle changes, or try home treatments. If these do not help, procedures can be done to remove the hemorrhoids or make them smaller. These may involve:  Placing rubber bands at the base of the hemorrhoids to cut off their blood supply.  Injecting medicine into the hemorrhoids to shrink them.  Shining a type of light  energy onto the hemorrhoids to cause them to fall off.  Doing surgery to remove the hemorrhoids or cut off their blood supply. Follow these instructions at home: Eating and drinking  Eat foods that have a lot of fiber in them. These include whole grains, beans, nuts, fruits, and vegetables.  Ask your doctor about taking products that have added fiber (fibersupplements).  Reduce the amount of fat in your diet. You can do this by: ? Eating low-fat dairy products. ? Eating less red meat. ? Avoiding processed foods.  Drink enough fluid to keep your pee (urine) pale yellow.   Managing pain and swelling  Take a warm-water bath (sitz bath) for 20 minutes to ease pain. Do this 3-4 times a day. You may do this in a bathtub or using a portable sitz bath that fits over the toilet.  If told, put ice on the painful area. It may be helpful to use ice between your warm baths. ? Put ice in a plastic bag. ? Place a towel between your skin and the bag. ? Leave the ice on for 20 minutes, 2-3 times a day.   General instructions  Take over-the-counter and prescription medicines only as told by your doctor. ? Medicated creams and medicines may be used as told.  Exercise often. Ask your doctor how much and what kind of exercise is best for you.  Go to the bathroom when you have the urge to poop. Do not wait.  Avoid pushing too hard when you poop.    Keep your butt dry and clean. Use wet toilet paper or moist towelettes after pooping.  Do not sit on the toilet for a long time.  Keep all follow-up visits as told by your doctor. This is important. Contact a doctor if you:  Have pain and swelling that do not get better with treatment or medicine.  Have trouble pooping.  Cannot poop.  Have pain or swelling outside the area of the hemorrhoids. Get help right away if you have:  Bleeding that will not stop. Summary  Hemorrhoids are swollen veins in the butt or around the opening of the  butt.  They can cause pain, itching, or bleeding.  Eat foods that have a lot of fiber in them. These include whole grains, beans, nuts, fruits, and vegetables.  Take a warm-water bath (sitz bath) for 20 minutes to ease pain. Do this 3-4 times a day. This information is not intended to replace advice given to you by your health care provider. Make sure you discuss any questions you have with your health care provider. Document Revised: 03/10/2018 Document Reviewed: 07/22/2017 Elsevier Patient Education  2021 Elsevier Inc.  

## 2020-05-29 ENCOUNTER — Other Ambulatory Visit: Payer: Self-pay

## 2020-05-29 ENCOUNTER — Other Ambulatory Visit (INDEPENDENT_AMBULATORY_CARE_PROVIDER_SITE_OTHER): Payer: Medicaid Other

## 2020-05-29 DIAGNOSIS — E785 Hyperlipidemia, unspecified: Secondary | ICD-10-CM | POA: Diagnosis not present

## 2020-05-29 DIAGNOSIS — R030 Elevated blood-pressure reading, without diagnosis of hypertension: Secondary | ICD-10-CM

## 2020-05-29 LAB — URINALYSIS, ROUTINE W REFLEX MICROSCOPIC
Bilirubin, UA: NEGATIVE
Glucose, UA: NEGATIVE
Ketones, UA: NEGATIVE
Leukocytes,UA: NEGATIVE
Nitrite, UA: NEGATIVE
RBC, UA: NEGATIVE
Specific Gravity, UA: >1.03 — ABNORMAL HIGH (ref 1.005–1.030)
Urobilinogen, Ur: 0.2 mg/dL (ref 0.2–1.0)
pH, UA: 5.5 (ref 5.0–7.5)

## 2020-05-29 LAB — MICROSCOPIC EXAMINATION
Bacteria, UA: NONE SEEN
Epithelial Cells (non renal): NONE SEEN /hpf (ref 0–10)
RBC, Urine: NONE SEEN /hpf (ref 0–2)
WBC, UA: NONE SEEN /hpf (ref 0–5)

## 2020-05-30 LAB — CBC WITH DIFFERENTIAL/PLATELET
Basophils Absolute: 0.1 10*3/uL (ref 0.0–0.2)
Basos: 1 %
EOS (ABSOLUTE): 0.2 10*3/uL (ref 0.0–0.4)
Eos: 3 %
Hematocrit: 45.5 % (ref 37.5–51.0)
Hemoglobin: 15.1 g/dL (ref 13.0–17.7)
Immature Grans (Abs): 0 10*3/uL (ref 0.0–0.1)
Immature Granulocytes: 0 %
Lymphocytes Absolute: 1.9 10*3/uL (ref 0.7–3.1)
Lymphs: 33 %
MCH: 29.7 pg (ref 26.6–33.0)
MCHC: 33.2 g/dL (ref 31.5–35.7)
MCV: 90 fL (ref 79–97)
Monocytes Absolute: 0.6 10*3/uL (ref 0.1–0.9)
Monocytes: 9 %
Neutrophils Absolute: 3.1 10*3/uL (ref 1.4–7.0)
Neutrophils: 54 %
Platelets: 219 10*3/uL (ref 150–450)
RBC: 5.08 x10E6/uL (ref 4.14–5.80)
RDW: 13.1 % (ref 11.6–15.4)
WBC: 5.8 10*3/uL (ref 3.4–10.8)

## 2020-05-30 LAB — LIPID PANEL
Chol/HDL Ratio: 3.4 ratio (ref 0.0–5.0)
Cholesterol, Total: 184 mg/dL (ref 100–199)
HDL: 54 mg/dL (ref >39)
LDL Chol Calc (NIH): 108 mg/dL — ABNORMAL HIGH (ref 0–99)
Triglycerides: 122 mg/dL (ref 0–149)
VLDL Cholesterol Cal: 22 mg/dL (ref 5–40)

## 2020-05-30 LAB — PSA: Prostate Specific Ag, Serum: 1.2 ng/mL (ref 0.0–4.0)

## 2020-06-04 ENCOUNTER — Ambulatory Visit
Admission: RE | Admit: 2020-06-04 | Discharge: 2020-06-04 | Disposition: A | Discharge status: Home / Self Care | Payer: Medicaid Other | Source: Home / Self Care | Attending: Internal Medicine | Admitting: Internal Medicine

## 2020-06-04 ENCOUNTER — Encounter: Payer: Self-pay | Admitting: Internal Medicine

## 2020-06-04 ENCOUNTER — Ambulatory Visit (INDEPENDENT_AMBULATORY_CARE_PROVIDER_SITE_OTHER): Payer: Medicaid Other | Admitting: Internal Medicine

## 2020-06-04 ENCOUNTER — Inpatient Hospital Stay: Admit: 2020-06-04 | Payer: Medicaid Other

## 2020-06-04 ENCOUNTER — Ambulatory Visit
Admission: RE | Admit: 2020-06-04 | Discharge: 2020-06-04 | Disposition: A | Discharge status: Home / Self Care | Payer: Medicaid Other | Source: Ambulatory Visit | Attending: Internal Medicine | Admitting: Internal Medicine

## 2020-06-04 ENCOUNTER — Other Ambulatory Visit: Payer: Self-pay

## 2020-06-04 VITALS — BP 120/80 | HR 83 | Temp 98.4°F | Ht 73.0 in | Wt 205.4 lb

## 2020-06-04 DIAGNOSIS — G8929 Other chronic pain: Secondary | ICD-10-CM

## 2020-06-04 DIAGNOSIS — M545 Low back pain, unspecified: Secondary | ICD-10-CM | POA: Diagnosis not present

## 2020-06-04 IMAGING — DX DG LUMBAR SPINE COMPLETE 4+V
6 series · 6 of 6 positions shown · non-contrast
Comparison: May 07, 2017

CLINICAL DATA: Chronic low back pain

EXAM:
LUMBAR SPINE - COMPLETE 4+ VIEW

[l-spine obl (1 of 3)]
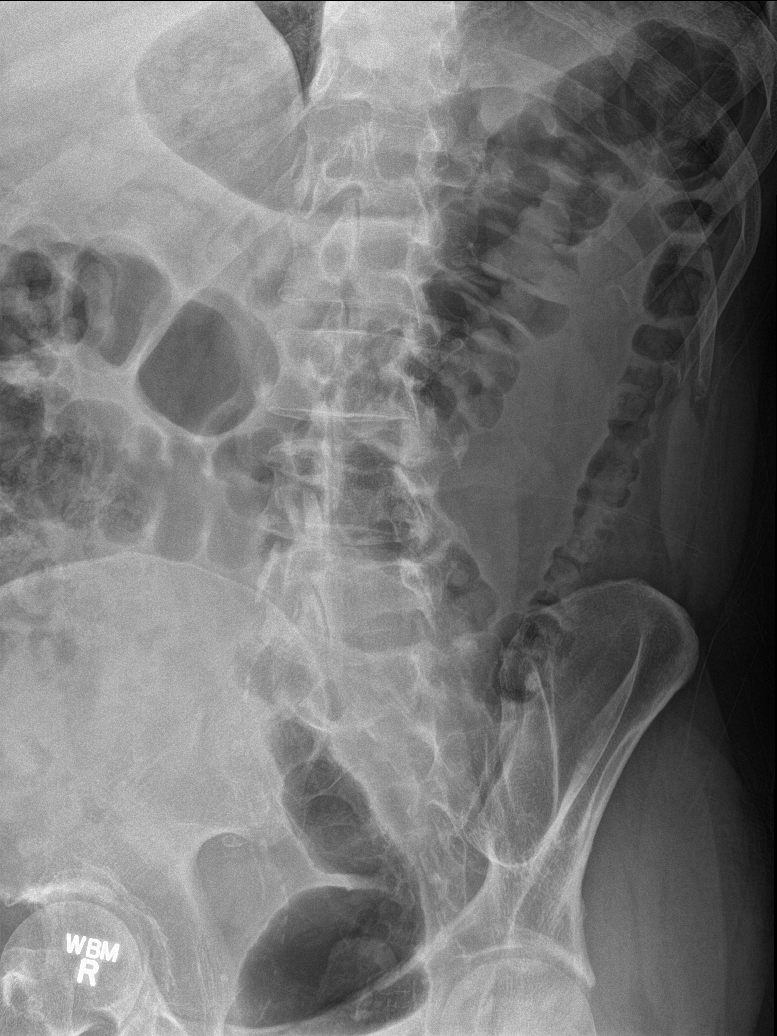

[l-spine obl (2 of 3)]
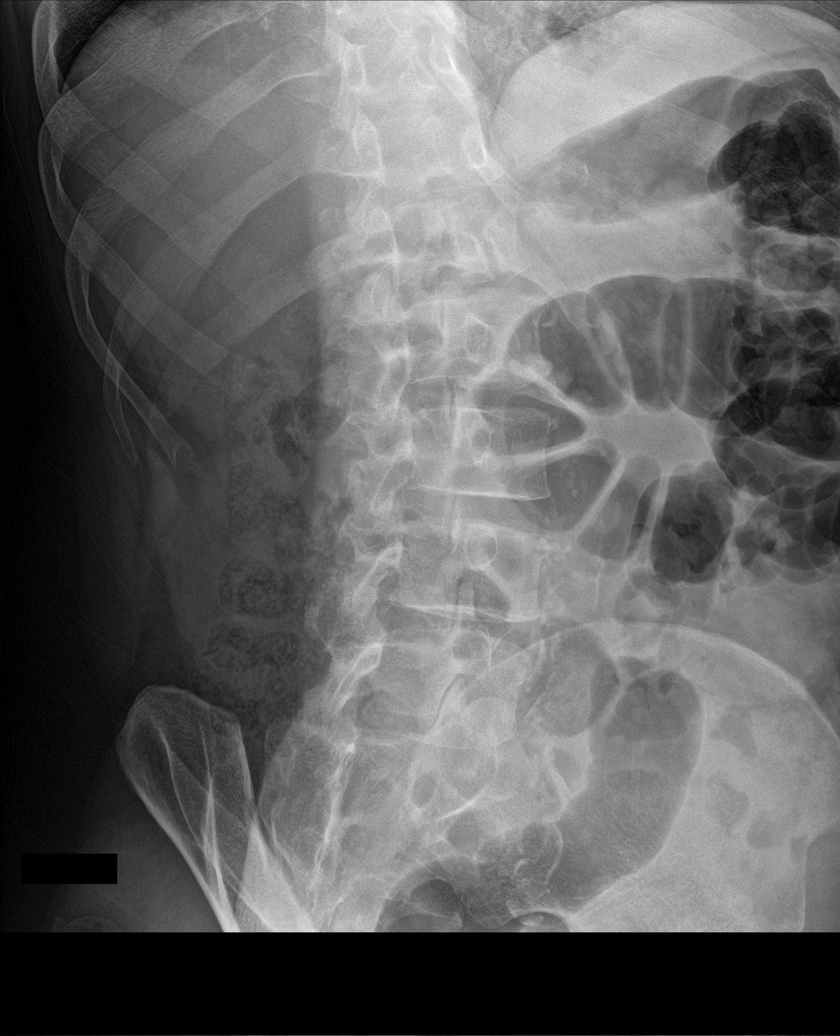

[l-spine lat]
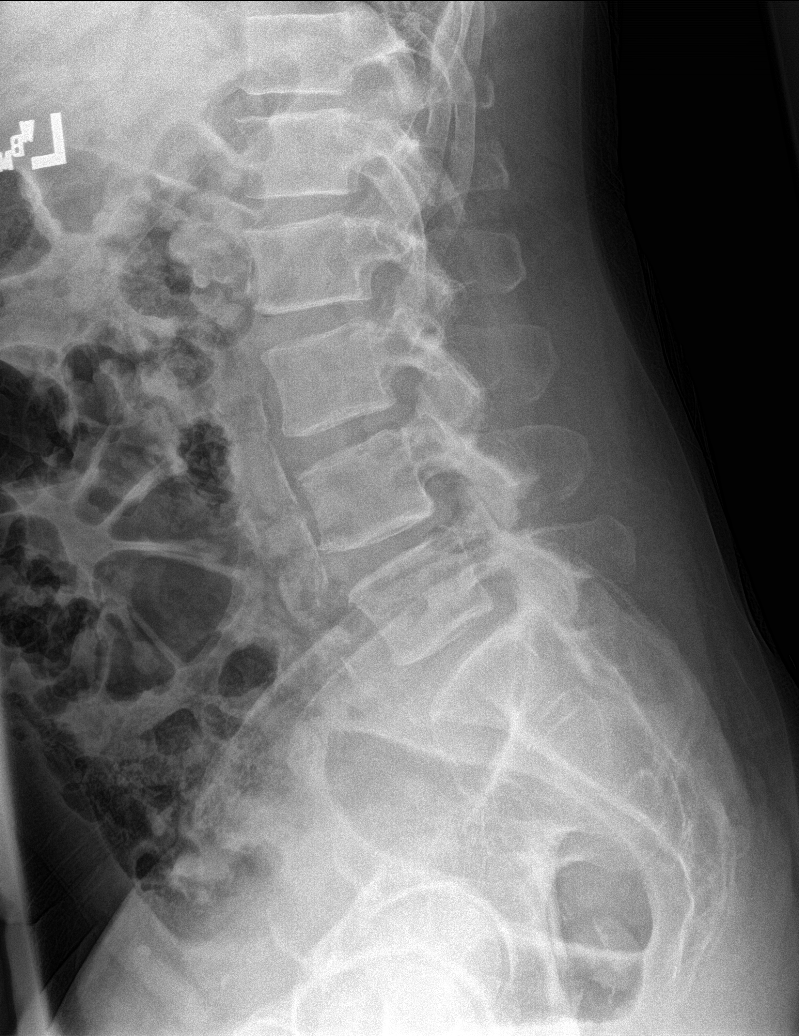

[l-spine obl (3 of 3)]
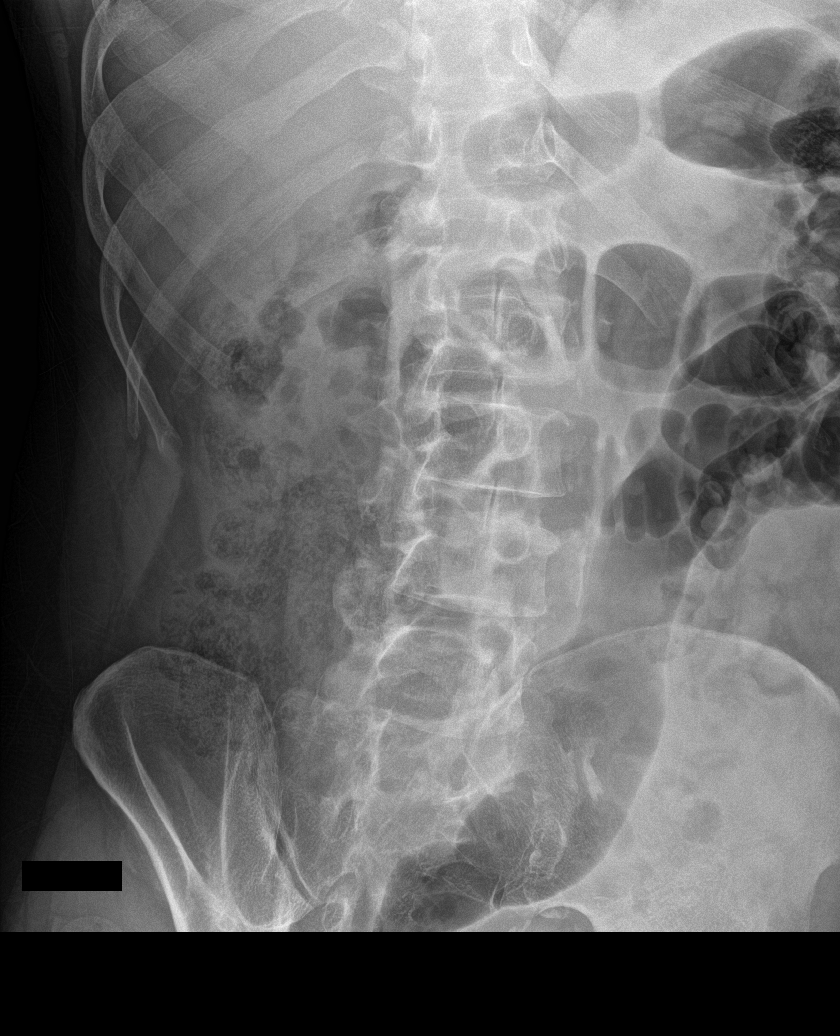

[l-spine spot]
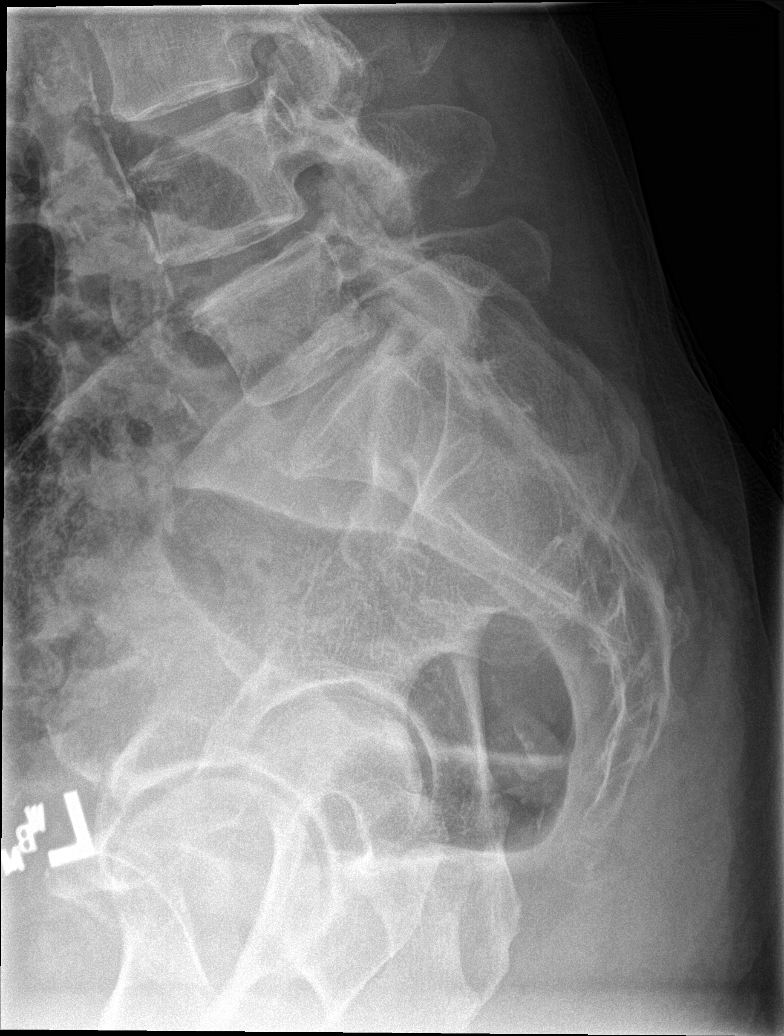

[l-spine ap]
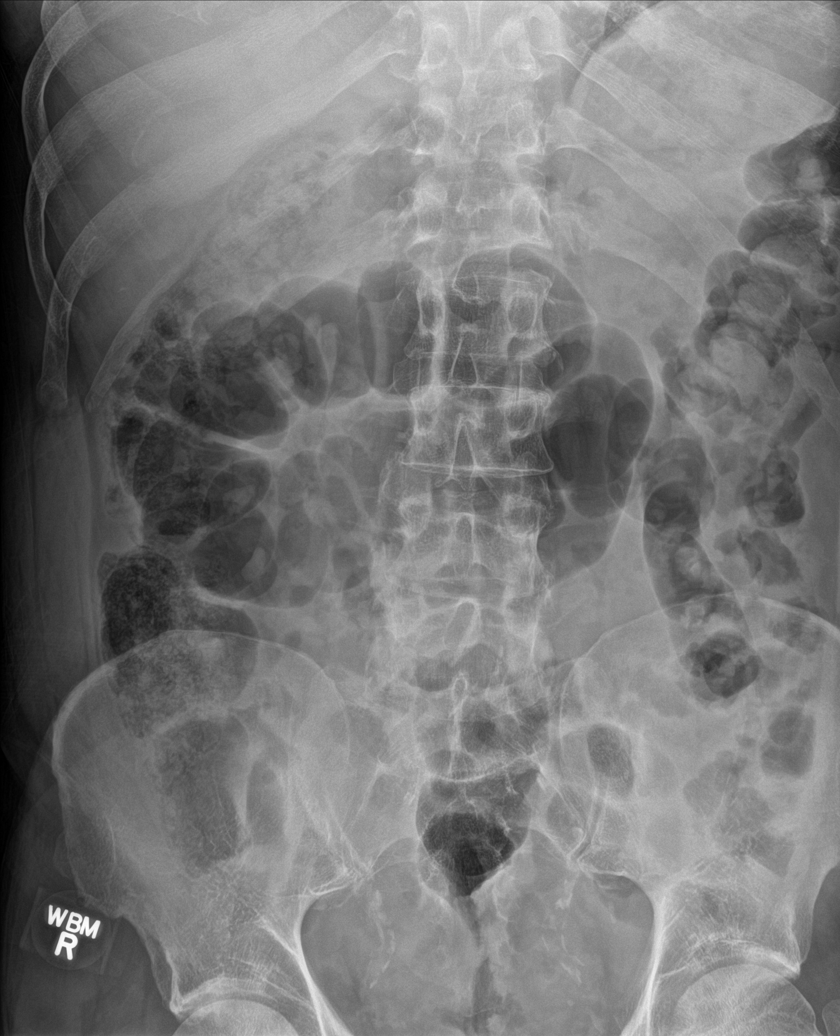

[6 of 6 positions shown; findings below may reference images not displayed]

FINDINGS: Frontal, lateral, spot lumbosacral lateral, and bilateral oblique
views were obtained. There are 4 non-rib-bearing lumbar type
vertebral bodies. Slight lumbar levoscoliosis is stable. There is no
fracture or spondylolisthesis. Disc spaces appear unremarkable.
There is facet osteoarthritic change at L2-3, L3-4, and L4-S1
bilaterally. No erosions. There is aortic atherosclerosis.
IMPRESSION: Four non-rib-bearing lumbar type vertebral bodies. Facet
osteoarthritic change at L2-3, L3-4, and L4-S1 bilaterally. No
appreciable disc space narrowing. No fracture or spondylolisthesis.

Aortic Atherosclerosis ([id number]).

## 2020-06-04 NOTE — Progress Notes (Signed)
BP 120/80   Pulse 83   Temp 98.4 F (36.9 C) (Oral)   Ht 6\' 1"  (1.854 m)   Wt 205 lb 6.4 oz (93.2 kg)   SpO2 97%   BMI 27.10 kg/m    Subjective:    Patient ID: , male    DOB: 02-14-1961, 60 y.o.   MRN: [id number]  HPI: Charles Lamb is a 60 y.o. male  Hyperlipidemia This is a chronic (is on crestorfor such 10 mg only) problem. Pertinent negatives include no leg pain.  Back Pain This is a chronic problem. The current episode started more than 1 year ago. The problem has been gradually worsening since onset. The pain is present in the lumbar spine. Pertinent negatives include no abdominal pain, bladder incontinence, bowel incontinence, dysuria, fever, leg pain, numbness, paresis, paresthesias, pelvic pain, perianal numbness, tingling, weakness or weight loss.    Chief Complaint  Patient presents with  . Hypertension    Review labwork   . Hyperlipidemia    Relevant past medical, surgical, family and social history reviewed and updated as indicated. Interim medical history since our last visit reviewed. Allergies and medications reviewed and updated.  Review of Systems  Constitutional: Negative for fever and weight loss.  Gastrointestinal: Negative for abdominal pain and bowel incontinence.  Genitourinary: Negative for bladder incontinence, dysuria and pelvic pain.  Musculoskeletal: Positive for back pain.  Neurological: Negative for tingling, weakness, numbness and paresthesias.    Per HPI unless specifically indicated above     Objective:    BP 120/80   Pulse 83   Temp 98.4 F (36.9 C) (Oral)   Ht 6\' 1"  (1.854 m)   Wt 205 lb 6.4 oz (93.2 kg)   SpO2 97%   BMI 27.10 kg/m   Wt Readings from Last 3 Encounters:  06/04/20 205 lb 6.4 oz (93.2 kg)  05/27/20 202 lb 6.4 oz (91.8 kg)  05/06/20 204 lb (92.5 kg)    Physical Exam Vitals and nursing note reviewed.  Constitutional:      General: He is not in acute distress.    Appearance: Normal  appearance. He is not ill-appearing or diaphoretic.  HENT:     Head: Normocephalic and atraumatic.     Right Ear: Tympanic membrane and external ear normal. There is no impacted cerumen.     Left Ear: External ear normal.     Nose: No congestion or rhinorrhea.     Mouth/Throat:     Pharynx: No oropharyngeal exudate or posterior oropharyngeal erythema.  Eyes:     Conjunctiva/sclera: Conjunctivae normal.     Pupils: Pupils are equal, round, and reactive to light.  Cardiovascular:     Rate and Rhythm: Normal rate and regular rhythm.     Heart sounds: No murmur heard. No friction rub. No gallop.   Pulmonary:     Effort: No respiratory distress.     Breath sounds: No stridor. No wheezing or rhonchi.  Chest:     Chest wall: No tenderness.  Abdominal:     General: Abdomen is flat. Bowel sounds are normal.     Palpations: Abdomen is soft. There is no mass.     Tenderness: There is no abdominal tenderness.  Musculoskeletal:        General: Tenderness present.     Cervical back: Normal range of motion and neck supple. No rigidity or tenderness.     Left lower leg: No edema.     Comments: bil paraspinal area  Skin:    General: Skin is warm and dry.  Neurological:     Mental Status: He is alert.     Results for orders placed or performed in visit on 05/29/20  Microscopic Examination   Urine  Result Value Ref Range   WBC, UA None seen 0 - 5 /hpf   RBC None seen 0 - 2 /hpf   Epithelial Cells (non renal) None seen 0 - 10 /hpf   Bacteria, UA None seen None seen/Few  Lipid Profile  Result Value Ref Range   Cholesterol, Total 184 100 - 199 mg/dL   Triglycerides 370 0 - 149 mg/dL   HDL 54 >48 mg/dL   VLDL Cholesterol Cal 22 5 - 40 mg/dL   LDL Chol Calc (NIH) 889 (H) 0 - 99 mg/dL   Chol/HDL Ratio 3.4 0.0 - 5.0 ratio  Urinalysis, Routine w reflex microscopic  Result Value Ref Range   Specific Gravity, UA >1.030 (H) 1.005 - 1.030   pH, UA 5.5 5.0 - 7.5   Color, UA Yellow Yellow    Appearance Ur Clear Clear   Leukocytes,UA Negative Negative   Protein,UA Trace (A) Negative/Trace   Glucose, UA Negative Negative   Ketones, UA Negative Negative   RBC, UA Negative Negative   Bilirubin, UA Negative Negative   Urobilinogen, Ur 0.2 0.2 - 1.0 mg/dL   Nitrite, UA Negative Negative   Microscopic Examination See below:   PSA  Result Value Ref Range   Prostate Specific Ag, Serum 1.2 0.0 - 4.0 ng/mL  CBC with Differential/Platelet  Result Value Ref Range   WBC 5.8 3.4 - 10.8 x10E3/uL   RBC 5.08 4.14 - 5.80 x10E6/uL   Hemoglobin 15.1 13.0 - 17.7 g/dL   Hematocrit 16.9 45.0 - 51.0 %   MCV 90 79 - 97 fL   MCH 29.7 26.6 - 33.0 pg   MCHC 33.2 31.5 - 35.7 g/dL   RDW 38.8 82.8 - 00.3 %   Platelets 219 150 - 450 x10E3/uL   Neutrophils 54 Not Estab. %   Lymphs 33 Not Estab. %   Monocytes 9 Not Estab. %   Eos 3 Not Estab. %   Basos 1 Not Estab. %   Neutrophils Absolute 3.1 1.4 - 7.0 x10E3/uL   Lymphocytes Absolute 1.9 0.7 - 3.1 x10E3/uL   Monocytes Absolute 0.6 0.1 - 0.9 x10E3/uL   EOS (ABSOLUTE) 0.2 0.0 - 0.4 x10E3/uL   Basophils Absolute 0.1 0.0 - 0.2 x10E3/uL   Immature Granulocytes 0 Not Estab. %   Immature Grans (Abs) 0.0 0.0 - 0.1 x10E3/uL      Assessment & Plan:  HLD; Lipid profile wnl, is on crestor 10 mg daily check LFT's work on diet, SE of meds explained to pt. low fat and high fiber diet explained to pt.  Ho hepatitis B +ve seen and rx with Gi per last note : sees dr. Wyline Mood for such .  Pt was"  seen in  02/01/2019 forB core total antibody that is positivesecondary to prior infection that he has cleared"   Hemmorrhoids ; stable per pt. Some bleeing continues sees Gi for a fu on such. S/p flex sig as below.   Back pain chronic  probably secondary to muscle spasms relieved with ibuprofen as prescribed in the ER UA negative no UTI patient does not have any symptoms either. will need ? MRI of back if pain persists. most likely musckulskelteal  though adviced streches for back  Patient advised to take  medication as directed here. Patient advised to rest initially and then slowly increase activity level. Monitor changes in symptoms such as numbness, tingling or weakness in legs, changes in bowel or bladder habits or worsening back pain. Proper ergonomics discussed. Referral to physical therapy as needed. Patient will call if symptoms worsen or if pain persists greater than 8 weeks.    Problem List Items Addressed This Visit   None      Follow up plan: No follow-ups on file.  Health Maintenance : PSA : 2022 Cscope : 2020 Pneumonia vaccine :pneumovax 2022 last visit

## 2020-06-04 NOTE — Patient Instructions (Signed)
Acute Back Pain, Adult Acute back pain is sudden and usually short-lived. It is often caused by an injury to the muscles and tissues in the back. The injury may result from:  A muscle or ligament getting overstretched or torn (strained). Ligaments are tissues that connect bones to each other. Lifting something improperly can cause a back strain.  Wear and tear (degeneration) of the spinal disks. Spinal disks are circular tissue that provide cushioning between the bones of the spine (vertebrae).  Twisting motions, such as while playing sports or doing yard work.  A hit to the back.  Arthritis. You may have a physical exam, lab tests, and imaging tests to find the cause of your pain. Acute back pain usually goes away with rest and home care. Follow these instructions at home: Managing pain, stiffness, and swelling  Treatment may include medicines for pain and inflammation that are taken by mouth or applied to the skin, prescription pain medicine, or muscle relaxants. Take over-the-counter and prescription medicines only as told by your health care provider.  Your health care provider may recommend applying ice during the first 24-48 hours after your pain starts. To do this: ? Put ice in a plastic bag. ? Place a towel between your skin and the bag. ? Leave the ice on for 20 minutes, 2-3 times a day.  If directed, apply heat to the affected area as often as told by your health care provider. Use the heat source that your health care provider recommends, such as a moist heat pack or a heating pad. ? Place a towel between your skin and the heat source. ? Leave the heat on for 20-30 minutes. ? Remove the heat if your skin turns bright red. This is especially important if you are unable to feel pain, heat, or cold. You have a greater risk of getting burned. Activity  Do not stay in bed. Staying in bed for more than 1-2 days can delay your recovery.  Sit up and stand up straight. Avoid leaning  forward when you sit or hunching over when you stand. ? If you work at a desk, sit close to it so you do not need to lean over. Keep your chin tucked in. Keep your neck drawn back, and keep your elbows bent at a 90-degree angle (right angle). ? Sit high and close to the steering wheel when you drive. Add lower back (lumbar) support to your car seat, if needed.  Take short walks on even surfaces as soon as you are able. Try to increase the length of time you walk each day.  Do not sit, drive, or stand in one place for more than 30 minutes at a time. Sitting or standing for long periods of time can put stress on your back.  Do not drive or use heavy machinery while taking prescription pain medicine.  Use proper lifting techniques. When you bend and lift, use positions that put less stress on your back: ? Bend your knees. ? Keep the load close to your body. ? Avoid twisting.  Exercise regularly as told by your health care provider. Exercising helps your back heal faster and helps prevent back injuries by keeping muscles strong and flexible.  Work with a physical therapist to make a safe exercise program, as recommended by your health care provider. Do any exercises as told by your physical therapist.   Lifestyle  Maintain a healthy weight. Extra weight puts stress on your back and makes it difficult to have   good posture.  Avoid activities or situations that make you feel anxious or stressed. Stress and anxiety increase muscle tension and can make back pain worse. Learn ways to manage anxiety and stress, such as through exercise. General instructions  Sleep on a firm mattress in a comfortable position. Try lying on your side with your knees slightly bent. If you lie on your back, put a pillow under your knees.  Follow your treatment plan as told by your health care provider. This may include: ? Cognitive or behavioral therapy. ? Acupuncture or massage therapy. ? Meditation or yoga. Contact  a health care provider if:  You have pain that is not relieved with rest or medicine.  You have increasing pain going down into your legs or buttocks.  Your pain does not improve after 2 weeks.  You have pain at night.  You lose weight without trying.  You have a fever or chills. Get help right away if:  You develop new bowel or bladder control problems.  You have unusual weakness or numbness in your arms or legs.  You develop nausea or vomiting.  You develop abdominal pain.  You feel faint. Summary  Acute back pain is sudden and usually short-lived.  Use proper lifting techniques. When you bend and lift, use positions that put less stress on your back.  Take over-the-counter and prescription medicines and apply heat or ice as directed by your health care provider. This information is not intended to replace advice given to you by your health care provider. Make sure you discuss any questions you have with your health care provider. Document Revised: 11/24/2019 Document Reviewed: 11/24/2019 Elsevier Patient Education  2021 [employer]  

## 2020-07-11 ENCOUNTER — Telehealth: Payer: Self-pay

## 2020-07-11 NOTE — Telephone Encounter (Signed)
Pt wants to know if it is ok to take coated aspirins. He states that he hasn't taken any in 2 days. Pt is also wanting to know about his x-ray from march. He says that he has never heard anything back.   Copied from CRM [id number]. Topic: General - Call Back - No Documentation >> Jul 11, 2020 11:22 AM Randol Kern wrote: Reason for CRM: Pt has questions regarding his medication. Wants to know if it is safe to take aspirin alongside his other Rxs. Please advise   Best contact: [phone]

## 2020-08-28 ENCOUNTER — Other Ambulatory Visit: Payer: Medicaid Other

## 2020-08-28 ENCOUNTER — Other Ambulatory Visit: Payer: Self-pay

## 2020-08-29 ENCOUNTER — Other Ambulatory Visit: Payer: Medicaid Other

## 2020-08-29 DIAGNOSIS — E785 Hyperlipidemia, unspecified: Secondary | ICD-10-CM | POA: Diagnosis not present

## 2020-08-29 DIAGNOSIS — I10 Essential (primary) hypertension: Secondary | ICD-10-CM

## 2020-08-30 LAB — COMPREHENSIVE METABOLIC PANEL
ALT: 22 IU/L (ref 0–44)
AST: 20 IU/L (ref 0–40)
Albumin/Globulin Ratio: 1.8 (ref 1.2–2.2)
Albumin: 4.4 g/dL (ref 3.8–4.9)
Alkaline Phosphatase: 82 IU/L (ref 44–121)
BUN/Creatinine Ratio: 12 (ref 9–20)
BUN: 12 mg/dL (ref 6–24)
Bilirubin Total: <0.2 mg/dL (ref 0.0–1.2)
CO2: 20 mmol/L (ref 20–29)
Calcium: 9.5 mg/dL (ref 8.7–10.2)
Chloride: 106 mmol/L (ref 96–106)
Creatinine, Ser: 1.03 mg/dL (ref 0.76–1.27)
Globulin, Total: 2.4 g/dL (ref 1.5–4.5)
Glucose: 96 mg/dL (ref 65–99)
Potassium: 4.4 mmol/L (ref 3.5–5.2)
Sodium: 142 mmol/L (ref 134–144)
Total Protein: 6.8 g/dL (ref 6.0–8.5)
eGFR: 84 mL/min/{1.73_m2} (ref >59)

## 2020-08-30 LAB — LIPID PANEL W/O CHOL/HDL RATIO
Cholesterol, Total: 183 mg/dL (ref 100–199)
HDL: 48 mg/dL (ref >39)
LDL Chol Calc (NIH): 112 mg/dL — ABNORMAL HIGH (ref 0–99)
Triglycerides: 128 mg/dL (ref 0–149)
VLDL Cholesterol Cal: 23 mg/dL (ref 5–40)

## 2020-09-04 ENCOUNTER — Ambulatory Visit (INDEPENDENT_AMBULATORY_CARE_PROVIDER_SITE_OTHER): Payer: Medicaid Other | Admitting: Internal Medicine

## 2020-09-04 ENCOUNTER — Other Ambulatory Visit: Payer: Self-pay

## 2020-09-04 ENCOUNTER — Encounter: Payer: Self-pay | Admitting: Internal Medicine

## 2020-09-04 VITALS — BP 119/80 | HR 73 | Temp 98.7°F | Ht 72.99 in | Wt 203.0 lb

## 2020-09-04 DIAGNOSIS — F1010 Alcohol abuse, uncomplicated: Secondary | ICD-10-CM | POA: Diagnosis not present

## 2020-09-04 DIAGNOSIS — E7800 Pure hypercholesterolemia, unspecified: Secondary | ICD-10-CM | POA: Diagnosis not present

## 2020-09-04 DIAGNOSIS — K760 Fatty (change of) liver, not elsewhere classified: Secondary | ICD-10-CM | POA: Diagnosis not present

## 2020-09-04 DIAGNOSIS — I10 Essential (primary) hypertension: Secondary | ICD-10-CM | POA: Diagnosis not present

## 2020-09-04 MED ORDER — LIDOCAINE 5 % EX PTCH: 1.0000 | MEDICATED_PATCH | CUTANEOUS | 1 refills | Status: DC

## 2020-09-04 NOTE — Progress Notes (Signed)
BP 119/80   Pulse 73   Temp 98.7 F (37.1 C) (Oral)   Ht 6' 0.99" (1.854 m)   Wt 203 lb (92.1 kg)   SpO2 98%   BMI 26.79 kg/m    Subjective:    Patient ID: Charles Lamb, male    DOB: 02-Jun-1960, 60 y.o.   MRN: [id number]  Chief Complaint  Patient presents with  . Hypertension  . Hyperlipidemia  . Hepatitis B  . Alcohol Problem    HPI: Charles Lamb is a 60 y.o. male  Hypertension  Hyperlipidemia  Alcohol Problem   Chief Complaint  Patient presents with  . Hypertension  . Hyperlipidemia  . Hepatitis B  . Alcohol Problem    Relevant past medical, surgical, family and social history reviewed and updated as indicated. Interim medical history since our last visit reviewed. Allergies and medications reviewed and updated.  Review of Systems  Per HPI unless specifically indicated above     Objective:    BP 119/80   Pulse 73   Temp 98.7 F (37.1 C) (Oral)   Ht 6' 0.99" (1.854 m)   Wt 203 lb (92.1 kg)   SpO2 98%   BMI 26.79 kg/m   Wt Readings from Last 3 Encounters:  09/04/20 203 lb (92.1 kg)  06/04/20 205 lb 6.4 oz (93.2 kg)  05/27/20 202 lb 6.4 oz (91.8 kg)    Physical Exam  Results for orders placed or performed in visit on 08/29/20  Comprehensive metabolic panel  Result Value Ref Range   Glucose 96 65 - 99 mg/dL   BUN 12 6 - 24 mg/dL   Creatinine, Ser 1.03 0.76 - 1.27 mg/dL   eGFR 84 >59 mL/min/1.73   BUN/Creatinine Ratio 12 9 - 20   Sodium 142 134 - 144 mmol/L   Potassium 4.4 3.5 - 5.2 mmol/L   Chloride 106 96 - 106 mmol/L   CO2 20 20 - 29 mmol/L   Calcium 9.5 8.7 - 10.2 mg/dL   Total Protein 6.8 6.0 - 8.5 g/dL   Albumin 4.4 3.8 - 4.9 g/dL   Globulin, Total 2.4 1.5 - 4.5 g/dL   Albumin/Globulin Ratio 1.8 1.2 - 2.2   Bilirubin Total <0.2 0.0 - 1.2 mg/dL   Alkaline Phosphatase 82 44 - 121 IU/L   AST 20 0 - 40 IU/L   ALT 22 0 - 44 IU/L  Lipid Panel w/o Chol/HDL Ratio  Result Value Ref Range   Cholesterol, Total 183 100 - 199  mg/dL   Triglycerides 128 0 - 149 mg/dL   HDL 48 >39 mg/dL   VLDL Cholesterol Cal 23 5 - 40 mg/dL   LDL Chol Calc (NIH) 112 (H) 0 - 99 mg/dL        Current Outpatient Medications:  .  aspirin EC 325 MG EC tablet, Take 1 tablet (325 mg total) by mouth daily., Disp: 30 tablet, Rfl: 1 .  hydrocortisone (ANUSOL-HC) 2.5 % rectal cream, Place 1 application rectally 2 (two) times daily., Disp: 28 g, Rfl: 0 .  rosuvastatin (CRESTOR) 10 MG tablet, Take 1 tablet (10 mg total) by mouth daily., Disp: 90 tablet, Rfl: 1    Assessment & Plan:  HLDcontinue such  recheck FLP, check LFT's work on diet, SE of meds explained to pt. low fat and high fiber diet explained to pt.   2 back pain : Will start pt on lidoderm  3. Smoking cessation advised. Will start pt on nicotine patches in the  past. continues to smoke. more than > 5 - 10 mins of time was spent with pt regarding smoking cessation and complications.   Smokes about 1/3 ppd       Problem List Items Addressed This Visit   None    No orders of the defined types were placed in this encounter.    No orders of the defined types were placed in this encounter.    Follow up plan: No follow-ups on file.

## 2021-02-27 ENCOUNTER — Other Ambulatory Visit: Payer: Medicaid Other

## 2021-03-03 ENCOUNTER — Other Ambulatory Visit: Payer: Medicaid Other

## 2021-03-03 ENCOUNTER — Other Ambulatory Visit: Payer: Self-pay

## 2021-03-03 DIAGNOSIS — F1010 Alcohol abuse, uncomplicated: Secondary | ICD-10-CM

## 2021-03-03 DIAGNOSIS — E7800 Pure hypercholesterolemia, unspecified: Secondary | ICD-10-CM

## 2021-03-03 DIAGNOSIS — I10 Essential (primary) hypertension: Secondary | ICD-10-CM

## 2021-03-03 DIAGNOSIS — K760 Fatty (change of) liver, not elsewhere classified: Secondary | ICD-10-CM

## 2021-03-04 LAB — COMP. METABOLIC PANEL (12)
AST: 18 IU/L (ref 0–40)
Albumin/Globulin Ratio: 1.6 (ref 1.2–2.2)
Albumin: 4.3 g/dL (ref 3.8–4.9)
Alkaline Phosphatase: 71 IU/L (ref 44–121)
BUN/Creatinine Ratio: 11 (ref 9–20)
BUN: 11 mg/dL (ref 6–24)
Bilirubin Total: 0.2 mg/dL (ref 0.0–1.2)
Calcium: 9.3 mg/dL (ref 8.7–10.2)
Chloride: 106 mmol/L (ref 96–106)
Creatinine, Ser: 1 mg/dL (ref 0.76–1.27)
Globulin, Total: 2.7 g/dL (ref 1.5–4.5)
Glucose: 91 mg/dL (ref 70–99)
Potassium: 4.2 mmol/L (ref 3.5–5.2)
Sodium: 141 mmol/L (ref 134–144)
Total Protein: 7 g/dL (ref 6.0–8.5)
eGFR: 87 mL/min/{1.73_m2} (ref >59)

## 2021-03-04 LAB — LIPID PANEL
Chol/HDL Ratio: 4 ratio (ref 0.0–5.0)
Cholesterol, Total: 251 mg/dL — ABNORMAL HIGH (ref 100–199)
HDL: 63 mg/dL (ref >39)
LDL Chol Calc (NIH): 169 mg/dL — ABNORMAL HIGH (ref 0–99)
Triglycerides: 110 mg/dL (ref 0–149)
VLDL Cholesterol Cal: 19 mg/dL (ref 5–40)

## 2021-03-06 ENCOUNTER — Ambulatory Visit: Payer: Medicaid Other | Admitting: Internal Medicine

## 2021-03-12 ENCOUNTER — Encounter: Payer: Self-pay | Admitting: Internal Medicine

## 2021-03-12 ENCOUNTER — Ambulatory Visit (INDEPENDENT_AMBULATORY_CARE_PROVIDER_SITE_OTHER): Payer: Medicaid Other | Admitting: Internal Medicine

## 2021-03-12 ENCOUNTER — Other Ambulatory Visit: Payer: Self-pay

## 2021-03-12 VITALS — BP 127/80 | HR 87 | Temp 98.6°F | Ht 72.84 in | Wt 191.2 lb

## 2021-03-12 DIAGNOSIS — E7800 Pure hypercholesterolemia, unspecified: Secondary | ICD-10-CM | POA: Diagnosis not present

## 2021-03-12 DIAGNOSIS — M545 Low back pain, unspecified: Secondary | ICD-10-CM | POA: Diagnosis not present

## 2021-03-12 DIAGNOSIS — G8929 Other chronic pain: Secondary | ICD-10-CM

## 2021-03-12 MED ORDER — ASPIRIN 81 MG PO TBEC: 81.0000 mg | DELAYED_RELEASE_TABLET | Freq: Every day | ORAL | 12 refills | Status: DC

## 2021-03-12 MED ORDER — ROSUVASTATIN CALCIUM 20 MG PO TABS: 10.0000 mg | ORAL_TABLET | Freq: Every day | ORAL | 7 refills | Status: DC

## 2021-03-12 NOTE — Patient Instructions (Signed)

## 2021-03-12 NOTE — Progress Notes (Signed)
BP 127/80    Pulse 87    Temp 98.6 F (37 C) (Oral)    Ht 6' 0.84" (1.85 m)    Wt 191 lb 3.2 oz (86.7 kg)    SpO2 98%    BMI 25.34 kg/m    Subjective:    Patient ID: Charles Lamb, male    DOB: [birth year], 60 y.o.   MRN: [id number]  Chief Complaint  Patient presents with   Hyperlipidemia   Hypertension    HPI: Charles Lamb is a 60 y.o. male  Hyperlipidemia This is a chronic problem. Episode onset: ldl - 169 now , didnt take his crestor for about 2 months or 3 cannot remember. The problem is uncontrolled.  Back Pain This is a chronic (has OA of the l spine) problem. The current episode started more than 1 month ago. The problem has been gradually worsening since onset. The pain is present in the lumbar spine.   Chief Complaint  Patient presents with   Hyperlipidemia   Hypertension    Relevant past medical, surgical, family and social history reviewed and updated as indicated. Interim medical history since our last visit reviewed. Allergies and medications reviewed and updated.  Review of Systems  Musculoskeletal:  Positive for back pain.   Per HPI unless specifically indicated above     Objective:    BP 127/80    Pulse 87    Temp 98.6 F (37 C) (Oral)    Ht 6' 0.84" (1.85 m)    Wt 191 lb 3.2 oz (86.7 kg)    SpO2 98%    BMI 25.34 kg/m   Wt Readings from Last 3 Encounters:  03/12/21 191 lb 3.2 oz (86.7 kg)  09/04/20 203 lb (92.1 kg)  06/04/20 205 lb 6.4 oz (93.2 kg)    Physical Exam Vitals and nursing note reviewed.  Constitutional:      General: He is not in acute distress.    Appearance: Normal appearance. He is not ill-appearing or diaphoretic.  HENT:     Head: Normocephalic and atraumatic.     Right Ear: Tympanic membrane and external ear normal. There is no impacted cerumen.     Left Ear: External ear normal.     Nose: No congestion or rhinorrhea.     Mouth/Throat:     Pharynx: No oropharyngeal exudate or posterior oropharyngeal erythema.   Eyes:     Conjunctiva/sclera: Conjunctivae normal.     Pupils: Pupils are equal, round, and reactive to light.  Cardiovascular:     Rate and Rhythm: Normal rate and regular rhythm.     Heart sounds: No murmur heard.   No friction rub. No gallop.  Pulmonary:     Effort: No respiratory distress.     Breath sounds: No stridor. No wheezing or rhonchi.  Chest:     Chest wall: No tenderness.  Abdominal:     General: Abdomen is flat. Bowel sounds are normal.     Palpations: Abdomen is soft.  Musculoskeletal:     Cervical back: Normal range of motion and neck supple. No rigidity or tenderness.     Left lower leg: No edema.  Skin:    General: Skin is warm and dry.  Neurological:     Mental Status: He is alert.    Results for orders placed or performed in visit on 03/03/21  Lipid Profile  Result Value Ref Range   Cholesterol, Total 251 (H) 100 - 199 mg/dL   Triglycerides 110  0 - 149 mg/dL   HDL 63 >39 mg/dL   VLDL Cholesterol Cal 19 5 - 40 mg/dL   LDL Chol Calc (NIH) 169 (H) 0 - 99 mg/dL   Chol/HDL Ratio 4.0 0.0 - 5.0 ratio  Comp. Metabolic Panel (12)  Result Value Ref Range   Glucose 91 70 - 99 mg/dL   BUN 11 6 - 24 mg/dL   Creatinine, Ser 1.00 0.76 - 1.27 mg/dL   eGFR 87 >59 mL/min/1.73   BUN/Creatinine Ratio 11 9 - 20   Sodium 141 134 - 144 mmol/L   Potassium 4.2 3.5 - 5.2 mmol/L   Chloride 106 96 - 106 mmol/L   Calcium 9.3 8.7 - 10.2 mg/dL   Total Protein 7.0 6.0 - 8.5 g/dL   Albumin 4.3 3.8 - 4.9 g/dL   Globulin, Total 2.7 1.5 - 4.5 g/dL   Albumin/Globulin Ratio 1.6 1.2 - 2.2   Bilirubin Total 0.2 0.0 - 1.2 mg/dL   Alkaline Phosphatase 71 44 - 121 IU/L   AST 18 0 - 40 IU/L        Current Outpatient Medications:    aspirin EC 325 MG EC tablet, Take 1 tablet (325 mg total) by mouth daily., Disp: 30 tablet, Rfl: 1   hydrocortisone (ANUSOL-HC) 2.5 % rectal cream, Place 1 application rectally 2 (two) times daily., Disp: 28 g, Rfl: 0   lidocaine (LIDODERM) 5 %,  Place 1 patch onto the skin daily. Remove & Discard patch within 12 hours or as directed by MD (Patient not taking: Reported on 03/12/2021), Disp: 30 patch, Rfl: 1   rosuvastatin (CRESTOR) 10 MG tablet, Take 1 tablet (10 mg total) by mouth daily. (Patient not taking: Reported on 03/12/2021), Disp: 90 tablet, Rfl: 1    Assessment & Plan:  Back pain :  Four non-rib-bearing lumbar type vertebral bodies. Facet osteoarthritic change at L2-3, L3-4, and L4-S1 bilaterally. No appreciable disc space narrowing. No fracture or spondylolisthesis.   Aortic Atherosclerosis (ICD10-I70.0) non compliant with statins  HLD  Is non compliant with meds, will restart recheck  FLP, check LFT's work on diet, SE of meds explained to pt. low fat and high fiber diet explained to pt.    Problem List Items Addressed This Visit   None    No orders of the defined types were placed in this encounter.    No orders of the defined types were placed in this encounter.    Follow up plan: No follow-ups on file.

## 2021-03-13 ENCOUNTER — Telehealth: Payer: Self-pay | Admitting: Internal Medicine

## 2021-03-13 ENCOUNTER — Other Ambulatory Visit: Payer: Self-pay | Admitting: Internal Medicine

## 2021-03-13 MED ORDER — ROSUVASTATIN CALCIUM 20 MG PO TABS: 20.0000 mg | ORAL_TABLET | Freq: Every day | ORAL | 7 refills | Status: DC

## 2021-03-13 NOTE — Telephone Encounter (Signed)
LVM for patient to inform him that he should not cut his cholesterol medication in half and to please make sure to take the whole tablet of 20mg  Crestor

## 2021-03-13 NOTE — Telephone Encounter (Signed)
Copied from CRM [id number]. Topic: General - Inquiry >> Mar 13, 2021  2:31 PM Daphine Deutscher D wrote: Reason for CRM: Pt would like Dr. Charlotta Newton to call him back regarding his cholesterol medication.  He said he can not break it in half as she suggested.    CB#  [phone]

## 2021-06-10 ENCOUNTER — Encounter: Payer: Self-pay | Admitting: Internal Medicine

## 2021-06-10 ENCOUNTER — Other Ambulatory Visit: Payer: Self-pay

## 2021-06-10 ENCOUNTER — Ambulatory Visit
Admission: RE | Admit: 2021-06-10 | Discharge: 2021-06-10 | Disposition: A | Discharge status: Home / Self Care | Payer: Medicaid Other | Attending: Internal Medicine | Admitting: Internal Medicine

## 2021-06-10 ENCOUNTER — Ambulatory Visit (INDEPENDENT_AMBULATORY_CARE_PROVIDER_SITE_OTHER): Payer: Medicaid Other | Admitting: Internal Medicine

## 2021-06-10 ENCOUNTER — Ambulatory Visit
Admission: RE | Admit: 2021-06-10 | Discharge: 2021-06-10 | Disposition: A | Discharge status: Home / Self Care | Payer: Medicaid Other | Source: Ambulatory Visit | Attending: Internal Medicine | Admitting: Internal Medicine

## 2021-06-10 VITALS — BP 122/7 | HR 68 | Temp 98.3°F | Ht 72.84 in | Wt 190.6 lb

## 2021-06-10 DIAGNOSIS — M25511 Pain in right shoulder: Secondary | ICD-10-CM | POA: Diagnosis not present

## 2021-06-10 DIAGNOSIS — G8929 Other chronic pain: Secondary | ICD-10-CM | POA: Diagnosis not present

## 2021-06-10 DIAGNOSIS — E7800 Pure hypercholesterolemia, unspecified: Secondary | ICD-10-CM

## 2021-06-10 DIAGNOSIS — I10 Essential (primary) hypertension: Secondary | ICD-10-CM | POA: Diagnosis not present

## 2021-06-10 MED ORDER — IBUPROFEN 600 MG PO TABS: 600.0000 mg | ORAL_TABLET | Freq: Three times a day (TID) | ORAL | 0 refills | Status: AC | PRN

## 2021-06-10 NOTE — Progress Notes (Signed)
Xray shows pt has Mild acromioclavicular joint space narrowing. Pl let him know. ?Continue ibubrufen as rx fu as needed and if worsening will need to see his ortho. ?thnx ?

## 2021-06-10 NOTE — Progress Notes (Signed)
? ?BP (!) 122/7   Pulse 68   Temp 98.3 ?F (36.8 ?C) (Oral)   Ht 6' 0.84" (1.85 m)   Wt 190 lb 9.6 oz (86.5 kg)   SpO2 96%   BMI 25.26 kg/m?   ? ?Subjective:  ? ? Patient ID: Charles Lamb, male    DOB: 1960-12-25, 61 y.o.   MRN: [id number] ? ?Chief Complaint  ?Patient presents with  ?? Shoulder Pain  ?  Right shoulder pain x 1 week  ? ? ?HPI: ?Charles Lamb is a 61 y.o. male ? ?Shoulder Pain  ?This is a chronic problem.  ?Hyperlipidemia ?This is a chronic problem. The problem is controlled.  ? ?Chief Complaint  ?Patient presents with  ?? Shoulder Pain  ?  Right shoulder pain x 1 week  ? ? ?Relevant past medical, surgical, family and social history reviewed and updated as indicated. Interim medical history since our last visit reviewed. ?Allergies and medications reviewed and updated. ? ?Review of Systems ? ?Per HPI unless specifically indicated above ? ?   ?Objective:  ?  ?BP (!) 122/7   Pulse 68   Temp 98.3 ?F (36.8 ?C) (Oral)   Ht 6' 0.84" (1.85 m)   Wt 190 lb 9.6 oz (86.5 kg)   SpO2 96%   BMI 25.26 kg/m?   ?Wt Readings from Last 3 Encounters:  ?06/10/21 190 lb 9.6 oz (86.5 kg)  ?03/12/21 191 lb 3.2 oz (86.7 kg)  ?09/04/20 203 lb (92.1 kg)  ?  ?Physical Exam ?Vitals and nursing note reviewed.  ?Constitutional:   ?   General: He is not in acute distress. ?   Appearance: Normal appearance. He is not ill-appearing or diaphoretic.  ?HENT:  ?   Head: Normocephalic and atraumatic.  ?   Right Ear: Tympanic membrane and external ear normal. There is no impacted cerumen.  ?   Left Ear: External ear normal.  ?   Nose: No congestion or rhinorrhea.  ?   Mouth/Throat:  ?   Pharynx: No oropharyngeal exudate or posterior oropharyngeal erythema.  ?Eyes:  ?   Conjunctiva/sclera: Conjunctivae normal.  ?   Pupils: Pupils are equal, round, and reactive to light.  ?Cardiovascular:  ?   Rate and Rhythm: Normal rate and regular rhythm.  ?   Heart sounds: No murmur heard. ?  No friction rub. No gallop.  ?Pulmonary:  ?    Effort: No respiratory distress.  ?   Breath sounds: No stridor. No wheezing or rhonchi.  ?Chest:  ?   Chest wall: No tenderness.  ?Abdominal:  ?   General: Abdomen is flat. Bowel sounds are normal.  ?   Tenderness: There is no abdominal tenderness.  ?Musculoskeletal:  ?   Cervical back: Normal range of motion and neck supple. No rigidity or tenderness.  ?   Left lower leg: No edema.  ?Skin: ?   General: Skin is warm and dry.  ?Neurological:  ?   General: No focal deficit present.  ?   Mental Status: He is alert. Mental status is at baseline. He is disoriented.  ?   Cranial Nerves: No cranial nerve deficit.  ?   Sensory: No sensory deficit.  ?Psychiatric:     ?   Mood and Affect: Mood normal.     ?   Behavior: Behavior normal.     ?   Thought Content: Thought content normal.     ?   Judgment: Judgment normal.  ? ? ?Results for  orders placed or performed in visit on 03/03/21  ?Lipid Profile  ?Result Value Ref Range  ? Cholesterol, Total 251 (H) 100 - 199 mg/dL  ? Triglycerides 110 0 - 149 mg/dL  ? HDL 63 >39 mg/dL  ? VLDL Cholesterol Cal 19 5 - 40 mg/dL  ? LDL Chol Calc (NIH) 169 (H) 0 - 99 mg/dL  ? Chol/HDL Ratio 4.0 0.0 - 5.0 ratio  ?Comp. Metabolic Panel (12)  ?Result Value Ref Range  ? Glucose 91 70 - 99 mg/dL  ? BUN 11 6 - 24 mg/dL  ? Creatinine, Ser 1.00 0.76 - 1.27 mg/dL  ? eGFR 87 >59 mL/min/1.73  ? BUN/Creatinine Ratio 11 9 - 20  ? Sodium 141 134 - 144 mmol/L  ? Potassium 4.2 3.5 - 5.2 mmol/L  ? Chloride 106 96 - 106 mmol/L  ? Calcium 9.3 8.7 - 10.2 mg/dL  ? Total Protein 7.0 6.0 - 8.5 g/dL  ? Albumin 4.3 3.8 - 4.9 g/dL  ? Globulin, Total 2.7 1.5 - 4.5 g/dL  ? Albumin/Globulin Ratio 1.6 1.2 - 2.2  ? Bilirubin Total 0.2 0.0 - 1.2 mg/dL  ? Alkaline Phosphatase 71 44 - 121 IU/L  ? AST 18 0 - 40 IU/L  ? ?   ? ? ?Current Outpatient Medications:  ??  aspirin 81 MG EC tablet, Take 1 tablet (81 mg total) by mouth daily. Swallow whole., Disp: 30 tablet, Rfl: 12 ??  hydrocortisone (ANUSOL-HC) 2.5 % rectal cream, Place  1 application rectally 2 (two) times daily., Disp: 28 g, Rfl: 0 ??  ibuprofen (ADVIL) 600 MG tablet, Take 1 tablet (600 mg total) by mouth every 8 (eight) hours as needed for up to 10 days (please take this medication with food)., Disp: 30 tablet, Rfl: 0 ??  rosuvastatin (CRESTOR) 20 MG tablet, Take 1 tablet (20 mg total) by mouth daily., Disp: 30 tablet, Rfl: 7  ? ? ?Assessment & Plan:  ?HLD Is on crestor 20 mg.  ?recheck FLP, check LFT's work on diet, SE of meds explained to pt. low fat and high fiber diet explained to pt. ? ? Latest Reference Range & Units 08/29/20 09:01 03/03/21 10:58  ?Total CHOL/HDL Ratio 0.0 - 5.0 ratio  4.0  ?Cholesterol, Total 100 - 199 mg/dL 183 251 (H)  ?HDL Cholesterol >39 mg/dL 48 63  ?Triglycerides 0 - 149 mg/dL 128 110  ?VLDL Cholesterol Cal 5 - 40 mg/dL 23 19  ?LDL Chol Calc (NIH) 0 - 99 mg/dL 112 (H) 169 (H)  ?(H): Data is abnormally high ? ? ?Right shoulder pain :  ?Worsening, check xray of the Right shoulder.   ?Left shoulder s/p replacement.  ? ?Problem List Items Addressed This Visit   ? ?  ? Cardiovascular and Mediastinum  ? Hypertension  ?  ? Other  ? Hypercholesteremia  ? Chronic right shoulder pain - Primary  ? Relevant Medications  ? ibuprofen (ADVIL) 600 MG tablet  ? Other Relevant Orders  ? DG Shoulder Right  ?  ? ?Orders Placed This Encounter  ?Procedures  ?? DG Shoulder Right  ?  ? ?Meds ordered this encounter  ?Medications  ?? ibuprofen (ADVIL) 600 MG tablet  ?  Sig: Take 1 tablet (600 mg total) by mouth every 8 (eight) hours as needed for up to 10 days (please take this medication with food).  ?  Dispense:  30 tablet  ?  Refill:  0  ?  ? ?Follow up plan: ?Return in about 6 months (  around 12/11/2021). ? ?

## 2021-06-11 LAB — COMPREHENSIVE METABOLIC PANEL
ALT: 24 IU/L (ref 0–44)
AST: 28 IU/L (ref 0–40)
Albumin/Globulin Ratio: 1.8 (ref 1.2–2.2)
Albumin: 4.7 g/dL (ref 3.8–4.9)
Alkaline Phosphatase: 74 IU/L (ref 44–121)
BUN/Creatinine Ratio: 7 — ABNORMAL LOW (ref 9–20)
BUN: 7 mg/dL (ref 6–24)
Bilirubin Total: 0.3 mg/dL (ref 0.0–1.2)
CO2: 21 mmol/L (ref 20–29)
Calcium: 9.7 mg/dL (ref 8.7–10.2)
Chloride: 107 mmol/L — ABNORMAL HIGH (ref 96–106)
Creatinine, Ser: 0.99 mg/dL (ref 0.76–1.27)
Globulin, Total: 2.6 g/dL (ref 1.5–4.5)
Glucose: 78 mg/dL (ref 70–99)
Potassium: 4.4 mmol/L (ref 3.5–5.2)
Sodium: 142 mmol/L (ref 134–144)
Total Protein: 7.3 g/dL (ref 6.0–8.5)
eGFR: 88 mL/min/{1.73_m2} (ref >59)

## 2021-06-11 LAB — CBC WITH DIFFERENTIAL/PLATELET
Basophils Absolute: 0.1 10*3/uL (ref 0.0–0.2)
Basos: 1 %
EOS (ABSOLUTE): 0.1 10*3/uL (ref 0.0–0.4)
Eos: 2 %
Hematocrit: 42.2 % (ref 37.5–51.0)
Hemoglobin: 14 g/dL (ref 13.0–17.7)
Immature Grans (Abs): 0 10*3/uL (ref 0.0–0.1)
Immature Granulocytes: 0 %
Lymphocytes Absolute: 1.9 10*3/uL (ref 0.7–3.1)
Lymphs: 32 %
MCH: 29.9 pg (ref 26.6–33.0)
MCHC: 33.2 g/dL (ref 31.5–35.7)
MCV: 90 fL (ref 79–97)
Monocytes Absolute: 0.5 10*3/uL (ref 0.1–0.9)
Monocytes: 8 %
Neutrophils Absolute: 3.4 10*3/uL (ref 1.4–7.0)
Neutrophils: 57 %
Platelets: 209 10*3/uL (ref 150–450)
RBC: 4.68 x10E6/uL (ref 4.14–5.80)
RDW: 13.5 % (ref 11.6–15.4)
WBC: 6 10*3/uL (ref 3.4–10.8)

## 2021-06-11 LAB — LIPID PANEL
Chol/HDL Ratio: 3.1 ratio (ref 0.0–5.0)
Cholesterol, Total: 185 mg/dL (ref 100–199)
HDL: 59 mg/dL (ref >39)
LDL Chol Calc (NIH): 94 mg/dL (ref 0–99)
Triglycerides: 186 mg/dL — ABNORMAL HIGH (ref 0–149)
VLDL Cholesterol Cal: 32 mg/dL (ref 5–40)

## 2021-09-10 ENCOUNTER — Encounter: Payer: Self-pay | Admitting: Internal Medicine

## 2021-09-10 ENCOUNTER — Ambulatory Visit (INDEPENDENT_AMBULATORY_CARE_PROVIDER_SITE_OTHER): Payer: Medicaid Other | Admitting: Internal Medicine

## 2021-09-10 VITALS — BP 111/69 | HR 74 | Temp 97.8°F | Ht 72.84 in | Wt 184.0 lb

## 2021-09-10 DIAGNOSIS — E785 Hyperlipidemia, unspecified: Secondary | ICD-10-CM

## 2021-09-10 DIAGNOSIS — M545 Low back pain, unspecified: Secondary | ICD-10-CM | POA: Diagnosis not present

## 2021-09-10 DIAGNOSIS — Z125 Encounter for screening for malignant neoplasm of prostate: Secondary | ICD-10-CM

## 2021-09-10 DIAGNOSIS — G8929 Other chronic pain: Secondary | ICD-10-CM

## 2021-09-10 MED ORDER — DICLOFENAC SODIUM 1 % EX GEL: 2.0000 g | Freq: Four times a day (QID) | CUTANEOUS | 1 refills | Status: DC

## 2021-09-10 NOTE — Progress Notes (Signed)
BP 111/69   Pulse 74   Temp 97.8 F (36.6 C) (Oral)   Ht 6' 0.84" (1.85 m)   Wt 184 lb (83.5 kg)   SpO2 98%   BMI 24.39 kg/m    Subjective:    Patient ID: Charles Lamb, male    DOB: 1960-09-26, 62 y.o.   MRN: [id number]  Chief Complaint  Patient presents with   Hyperlipidemia    HPI: Charles Lamb is a 61 y.o. male  Hyperlipidemia This is a chronic problem. The current episode started more than 1 year ago. The problem is controlled. He has no history of chronic renal disease, diabetes, hypothyroidism, liver disease, obesity or nephrotic syndrome. Pertinent negatives include no chest pain or leg pain.  Back Pain This is a chronic problem. The current episode started more than 1 year ago. The problem occurs intermittently. The quality of the pain is described as cramping. Pertinent negatives include no abdominal pain, bladder incontinence, bowel incontinence, chest pain, dysuria, fever, headaches, leg pain, numbness, paresis, paresthesias, pelvic pain, perianal numbness, tingling, weakness or weight loss. Risk factors include sedentary lifestyle.    Chief Complaint  Patient presents with   Hyperlipidemia    Relevant past medical, surgical, family and social history reviewed and updated as indicated. Interim medical history since our last visit reviewed. Allergies and medications reviewed and updated.  Review of Systems  Constitutional:  Negative for fever and weight loss.  Cardiovascular:  Negative for chest pain.  Gastrointestinal:  Negative for abdominal pain and bowel incontinence.  Genitourinary:  Negative for bladder incontinence, dysuria and pelvic pain.  Musculoskeletal:  Positive for back pain.  Neurological:  Negative for tingling, weakness, numbness, headaches and paresthesias.    Per HPI unless specifically indicated above     Objective:    BP 111/69   Pulse 74   Temp 97.8 F (36.6 C) (Oral)   Ht 6' 0.84" (1.85 m)   Wt 184 lb (83.5 kg)   SpO2  98%   BMI 24.39 kg/m   Wt Readings from Last 3 Encounters:  09/10/21 184 lb (83.5 kg)  06/10/21 190 lb 9.6 oz (86.5 kg)  03/12/21 191 lb 3.2 oz (86.7 kg)    Physical Exam Vitals and nursing note reviewed.  Constitutional:      General: He is not in acute distress.    Appearance: Normal appearance. He is not ill-appearing or diaphoretic.  HENT:     Head: Normocephalic and atraumatic.     Right Ear: Tympanic membrane and external ear normal. There is no impacted cerumen.     Left Ear: External ear normal.     Nose: No congestion or rhinorrhea.     Mouth/Throat:     Pharynx: No oropharyngeal exudate or posterior oropharyngeal erythema.  Eyes:     Conjunctiva/sclera: Conjunctivae normal.     Pupils: Pupils are equal, round, and reactive to light.  Cardiovascular:     Rate and Rhythm: Normal rate and regular rhythm.     Heart sounds: No murmur heard.    No friction rub. No gallop.  Pulmonary:     Effort: No respiratory distress.     Breath sounds: No stridor. No wheezing or rhonchi.  Chest:     Chest wall: No tenderness.  Abdominal:     General: Abdomen is flat. Bowel sounds are normal.     Palpations: Abdomen is soft. There is no mass.     Tenderness: There is no abdominal tenderness.  Musculoskeletal:  Cervical back: Normal range of motion and neck supple. No rigidity or tenderness.     Left lower leg: No edema.  Skin:    General: Skin is warm and dry.  Neurological:     Mental Status: He is alert.     Results for orders placed or performed in visit on 06/10/21  Lipid panel  Result Value Ref Range   Cholesterol, Total 185 100 - 199 mg/dL   Triglycerides 186 (H) 0 - 149 mg/dL   HDL 59 >39 mg/dL   VLDL Cholesterol Cal 32 5 - 40 mg/dL   LDL Chol Calc (NIH) 94 0 - 99 mg/dL   Chol/HDL Ratio 3.1 0.0 - 5.0 ratio  Comprehensive metabolic panel  Result Value Ref Range   Glucose 78 70 - 99 mg/dL   BUN 7 6 - 24 mg/dL   Creatinine, Ser 0.99 0.76 - 1.27 mg/dL   eGFR 88  >59 mL/min/1.73   BUN/Creatinine Ratio 7 (L) 9 - 20   Sodium 142 134 - 144 mmol/L   Potassium 4.4 3.5 - 5.2 mmol/L   Chloride 107 (H) 96 - 106 mmol/L   CO2 21 20 - 29 mmol/L   Calcium 9.7 8.7 - 10.2 mg/dL   Total Protein 7.3 6.0 - 8.5 g/dL   Albumin 4.7 3.8 - 4.9 g/dL   Globulin, Total 2.6 1.5 - 4.5 g/dL   Albumin/Globulin Ratio 1.8 1.2 - 2.2   Bilirubin Total 0.3 0.0 - 1.2 mg/dL   Alkaline Phosphatase 74 44 - 121 IU/L   AST 28 0 - 40 IU/L   ALT 24 0 - 44 IU/L  CBC with Differential/Platelet  Result Value Ref Range   WBC 6.0 3.4 - 10.8 x10E3/uL   RBC 4.68 4.14 - 5.80 x10E6/uL   Hemoglobin 14.0 13.0 - 17.7 g/dL   Hematocrit 42.2 37.5 - 51.0 %   MCV 90 79 - 97 fL   MCH 29.9 26.6 - 33.0 pg   MCHC 33.2 31.5 - 35.7 g/dL   RDW 13.5 11.6 - 15.4 %   Platelets 209 150 - 450 x10E3/uL   Neutrophils 57 Not Estab. %   Lymphs 32 Not Estab. %   Monocytes 8 Not Estab. %   Eos 2 Not Estab. %   Basos 1 Not Estab. %   Neutrophils Absolute 3.4 1.4 - 7.0 x10E3/uL   Lymphocytes Absolute 1.9 0.7 - 3.1 x10E3/uL   Monocytes Absolute 0.5 0.1 - 0.9 x10E3/uL   EOS (ABSOLUTE) 0.1 0.0 - 0.4 x10E3/uL   Basophils Absolute 0.1 0.0 - 0.2 x10E3/uL   Immature Granulocytes 0 Not Estab. %   Immature Grans (Abs) 0.0 0.0 - 0.1 x10E3/uL        Current Outpatient Medications:    aspirin 81 MG EC tablet, Take 1 tablet (81 mg total) by mouth daily. Swallow whole., Disp: 30 tablet, Rfl: 12   diclofenac Sodium (VOLTAREN) 1 % GEL, Apply 2 g topically 4 (four) times daily., Disp: 2 g, Rfl: 1   hydrocortisone (ANUSOL-HC) 2.5 % rectal cream, Place 1 application rectally 2 (two) times daily., Disp: 28 g, Rfl: 0   rosuvastatin (CRESTOR) 20 MG tablet, Take 1 tablet (20 mg total) by mouth daily., Disp: 30 tablet, Rfl: 7    Assessment & Plan:  Back pain will start pt on NSAIDS has had xrays of l spine :  Four non-rib-bearing lumbar type vertebral bodies. Facet osteoarthritic change at L2-3, L3-4, and L4-S1 bilaterally.  No appreciable disc space narrowing. No fracture  or spondylolisthesis.    Problem List Items Addressed This Visit       Other   Hyperlipidemia - Primary   Relevant Orders   Lipid panel   CBC with Differential/Platelet   Comprehensive metabolic panel   TSH   PSA   Screening for malignant neoplasm of prostate   Relevant Orders   PSA   Chronic low back pain without sciatica     Orders Placed This Encounter  Procedures   Lipid panel   CBC with Differential/Platelet   Comprehensive metabolic panel   TSH   PSA     Meds ordered this encounter  Medications   diclofenac Sodium (VOLTAREN) 1 % GEL    Sig: Apply 2 g topically 4 (four) times daily.    Dispense:  2 g    Refill:  1     Follow up plan: Return in about 3 months (around 12/11/2021).

## 2021-09-11 LAB — CBC WITH DIFFERENTIAL/PLATELET
Basophils Absolute: 0 10*3/uL (ref 0.0–0.2)
Basos: 1 %
EOS (ABSOLUTE): 0.2 10*3/uL (ref 0.0–0.4)
Eos: 4 %
Hematocrit: 42.7 % (ref 37.5–51.0)
Hemoglobin: 14.2 g/dL (ref 13.0–17.7)
Immature Grans (Abs): 0 10*3/uL (ref 0.0–0.1)
Immature Granulocytes: 0 %
Lymphocytes Absolute: 1.5 10*3/uL (ref 0.7–3.1)
Lymphs: 29 %
MCH: 30.5 pg (ref 26.6–33.0)
MCHC: 33.3 g/dL (ref 31.5–35.7)
MCV: 92 fL (ref 79–97)
Monocytes Absolute: 0.4 10*3/uL (ref 0.1–0.9)
Monocytes: 8 %
Neutrophils Absolute: 3 10*3/uL (ref 1.4–7.0)
Neutrophils: 58 %
Platelets: 195 10*3/uL (ref 150–450)
RBC: 4.65 x10E6/uL (ref 4.14–5.80)
RDW: 12.9 % (ref 11.6–15.4)
WBC: 5.2 10*3/uL (ref 3.4–10.8)

## 2021-09-11 LAB — COMPREHENSIVE METABOLIC PANEL
ALT: 21 IU/L (ref 0–44)
AST: 24 IU/L (ref 0–40)
Albumin/Globulin Ratio: 1.7 (ref 1.2–2.2)
Albumin: 4.5 g/dL (ref 3.8–4.9)
Alkaline Phosphatase: 68 IU/L (ref 44–121)
BUN/Creatinine Ratio: 13 (ref 9–20)
BUN: 13 mg/dL (ref 6–24)
Bilirubin Total: <0.2 mg/dL (ref 0.0–1.2)
CO2: 21 mmol/L (ref 20–29)
Calcium: 9.5 mg/dL (ref 8.7–10.2)
Chloride: 104 mmol/L (ref 96–106)
Creatinine, Ser: 1.03 mg/dL (ref 0.76–1.27)
Globulin, Total: 2.6 g/dL (ref 1.5–4.5)
Glucose: 119 mg/dL — ABNORMAL HIGH (ref 70–99)
Potassium: 4 mmol/L (ref 3.5–5.2)
Sodium: 140 mmol/L (ref 134–144)
Total Protein: 7.1 g/dL (ref 6.0–8.5)
eGFR: 84 mL/min/{1.73_m2} (ref >59)

## 2021-09-11 LAB — LIPID PANEL
Chol/HDL Ratio: 2.7 ratio (ref 0.0–5.0)
Cholesterol, Total: 163 mg/dL (ref 100–199)
HDL: 60 mg/dL (ref >39)
LDL Chol Calc (NIH): 84 mg/dL (ref 0–99)
Triglycerides: 104 mg/dL (ref 0–149)
VLDL Cholesterol Cal: 19 mg/dL (ref 5–40)

## 2021-09-11 LAB — TSH: TSH: 1.23 u[IU]/mL (ref 0.450–4.500)

## 2021-09-11 LAB — PSA: Prostate Specific Ag, Serum: 1.3 ng/mL (ref 0.0–4.0)

## 2021-11-11 ENCOUNTER — Telehealth: Payer: Self-pay | Admitting: Internal Medicine

## 2021-11-11 NOTE — Telephone Encounter (Signed)
Pt brought in form from [facility] he needs it to be filled out saying he has not had Hepatitis B. He states that when he tried to give his plasma they said it is in the computer that he has had it. Form placed in Dr. Gwynneth Albright folder to be given to the provider who will handle it.

## 2021-11-11 NOTE — Telephone Encounter (Signed)
Forms have been placed in Charles Lamb's folder for signature.

## 2021-11-12 ENCOUNTER — Encounter: Payer: Self-pay | Admitting: Nurse Practitioner

## 2021-11-12 ENCOUNTER — Telehealth: Payer: Self-pay

## 2021-11-12 NOTE — Telephone Encounter (Signed)
Copied from CRM [id number]. Topic: General - Inquiry >> Nov 12, 2021  1:51 PM [name] wrote: Reason for CRM: Pt calling requesting to speak with Destiny. Pt stated Destiny just left him a vm and has some questions for him.  Pt requesting a call back also mentioned he will stop by the office in about an hour.   Please advise.

## 2021-11-12 NOTE — Telephone Encounter (Signed)
Patient walked into office and scheduled an appointment to discuss with provider in person. Patient is scheduled for Friday September 1st.

## 2021-11-13 NOTE — Telephone Encounter (Signed)
I have printed a letter and placed with the paperwork he brought in.  Reviewed chart and GI notes.  There is nothing for me to do on paperwork, this is something he will have to have notarized and signed on his own:)  He would have to reach out to company reported on form to have this removed from database. Per Jolene.  Patient does not have to come in for his appt just to pick up his letter. Please call and let patient know

## 2021-11-13 NOTE — Telephone Encounter (Signed)
Patient aware and will be picking the form back up today

## 2021-11-14 ENCOUNTER — Ambulatory Visit: Payer: Medicaid Other | Admitting: Nurse Practitioner

## 2021-11-25 ENCOUNTER — Encounter: Payer: Self-pay | Admitting: Internal Medicine

## 2021-12-01 ENCOUNTER — Telehealth: Payer: Self-pay | Admitting: Internal Medicine

## 2021-12-01 ENCOUNTER — Other Ambulatory Visit: Payer: Self-pay

## 2021-12-01 MED ORDER — ROSUVASTATIN CALCIUM 20 MG PO TABS: 20.0000 mg | ORAL_TABLET | Freq: Every day | ORAL | 7 refills | Status: DC

## 2021-12-01 NOTE — Telephone Encounter (Signed)
LOV  09/10/21  Future Visit 12/25/21  Last Lipid 09/10/21

## 2021-12-04 ENCOUNTER — Other Ambulatory Visit: Payer: Self-pay

## 2021-12-04 NOTE — Patient Outreach (Signed)
Care Coordination  12/04/2021  Charles Lamb May 05, 1960 [id number]   Medicaid Managed Care   Unsuccessful Outreach Note  12/04/2021 Name: Charles Lamb MRN: [id number] DOB: 10/19/1960  Referred by: Charlynne Cousins, MD (Inactive) Reason for referral : High Risk Managed Medicaid ([facility] social work unsuccessful telephone outreach )   An unsuccessful telephone outreach was attempted today. The patient was referred to the case management team for assistance with care management and care coordination.   Follow Up Plan: The care management team will reach out to the patient again over the next 7 days.   [name], BSW, [place] Managed Medicaid Team  [phone]

## 2021-12-04 NOTE — Patient Instructions (Signed)
Visit Information  Mr. Charles Lamb  - as a part of your Medicaid benefit, you are eligible for care management and care coordination services at no cost or copay. I was unable to reach you by phone today but would be happy to help you with your health related needs. Please feel free to call me @ [phone].   A member of the Managed Medicaid care management team will reach out to you again over the next 7 days.   [name], BSW, [facility] Managed Medicaid Team  [phone]

## 2021-12-11 ENCOUNTER — Ambulatory Visit: Payer: Medicaid Other | Admitting: Internal Medicine

## 2021-12-16 ENCOUNTER — Ambulatory Visit: Payer: Medicaid Other | Admitting: Unknown Physician Specialty

## 2021-12-18 ENCOUNTER — Other Ambulatory Visit: Payer: Self-pay

## 2021-12-18 NOTE — Patient Outreach (Signed)
Care Coordination  12/18/2021  COCHISE DINNEEN 1960/11/13 [id number]    Medicaid Managed Care   Unsuccessful Outreach Note  12/18/2021 Name: Charles Lamb MRN: [id number] DOB: 1960/07/21  Referred by: Charlynne Cousins, MD (Inactive) Reason for referral : High Risk Managed Medicaid (MM Social Work unsuccessful telephone outreach )   A second unsuccessful telephone outreach was attempted today. The patient was referred to the case management team for assistance with care management and care coordination.   Follow Up Plan: The care management team will reach out to the patient again over the next 7 days.   [name], BSW, [place] Managed Medicaid Team  [phone]

## 2021-12-18 NOTE — Patient Outreach (Signed)
  Medicaid Managed Care Social Work Note  12/18/2021 Name:  Charles Lamb MRN:  [id number] DOB:  25-Jun-1960  Charles Lamb is an 61 y.o. year old male who is a primary patient of Vigg, Avanti, MD (Inactive).  The [facility] Managed Care Coordination team was consulted for assistance with:  Community Resources   Charles Lamb was given information about Medicaid Managed Care Coordination team services today. Charles Lamb Patient agreed to services and verbal consent obtained.  Engaged with patient  for by telephone forinitial visit in response to referral for case management and/or care coordination services.   Assessments/Interventions:  Review of past medical history, allergies, medications, health status, including review of consultants reports, laboratory and other test data, was performed as part of comprehensive evaluation and provision of chronic care management services.  SDOH: (Social Determinant of Health) assessments and interventions performed: BSW completed a telephone outreach with patient. He stated that he was able to pay his utilities for this month, but will need assistance next month. Patient asked for utility resources to be mailed to him. Patient states he does receive foodstamps and transportation is not needed.   Advanced Directives Status:  Not addressed in this encounter.  Care Plan                 No Known Allergies  Medications Reviewed Today     Reviewed by Charlynne Cousins, MD (Physician) on 09/10/21 at 1016  Med List Status: <None>   Medication Order Taking? Sig Documenting Provider Last Dose Status Informant  aspirin 81 MG EC tablet [id number] Yes Take 1 tablet (81 mg total) by mouth daily. Swallow whole. Charlynne Cousins, MD Taking Active   hydrocortisone (ANUSOL-HC) 2.5 % rectal cream [id number] Yes Place 1 application rectally 2 (two) times daily. Jonathon Bellows, MD Taking Active   rosuvastatin (CRESTOR) 20 MG tablet [id number] Yes Take 1 tablet (20 mg total) by  mouth daily. Charlynne Cousins, MD Taking Active             Patient Active Problem List   Diagnosis Date Noted   Hyperlipidemia 09/10/2021   Screening for malignant neoplasm of prostate 09/10/2021   Chronic low back pain without sciatica 09/10/2021   Chronic right shoulder pain 06/10/2021   Rectal bleeding 04/09/2020   Hepatitis B 12/14/2018   Alcohol abuse 01/16/2018   Fatty liver 01/12/2018   Aortic atherosclerosis ([place]) 01/12/2018   Hypercholesteremia 04/18/2012   Hypertension 08/19/2010   Proteinuria 06/12/2009    Conditions to be addressed/monitored per PCP order:   community resources  There are no care plans that you recently modified to display for this patient.   Follow up:  Patient agrees to Care Plan and Follow-up.  Plan: The Managed Medicaid care management team will reach out to the patient again over the next 10 days.  Date/time of next scheduled Social Work care management/care coordination outreach:  01/01/22  [name], Arita Miss, [place] Managed Medicaid Team  [phone]

## 2021-12-18 NOTE — Patient Instructions (Addendum)
Visit Information  Mr. Charles Lamb  - as a part of your Medicaid benefit, you are eligible for care management and care coordination services at no cost or copay. I was unable to reach you by phone today but would be happy to help you with your health related needs. Please feel free to call me @ [phone]).   A member of the Managed Medicaid care management team will reach out to you again over the next 7 days.   Charles Lamb, BSW, [place]  High Risk Managed Medicaid Team  [phone] Visit Information  Mr. Charles Lamb was given information about Medicaid Managed Care team care coordination services as a part of their [place] Medicaid benefit. Charles Lamb verbally consented to engagement with the [facility] Managed Care team.   If you are experiencing a medical emergency, please call 911 or report to your local emergency department or urgent care.   If you have a non-emergency medical problem during routine business hours, please contact your provider's office and ask to speak with a nurse.   For questions related to your [facility], please call: [phone] or visit the homepage here: [email or web address]  If you would like to schedule transportation through your [facility], please call the following number at least 2 days in advance of your appointment: [phone]   Rides for urgent appointments can also be made after hours by calling Member Services.  Call the [place] at [phone], at any time, 24 hours a day, 7 days a week. If you are in danger or need immediate medical attention call 911.  If you would like help to quit smoking, call 1-800-QUIT-NOW 724-266-3456) OR Espaol: 1-855-Djelo-Ya (7-654-650-3546) o para ms informacin haga clic aqu or Text READY to 200-400 to register  via text  [name] - following are the goals we discussed in your visit today:   Goals Addressed   None      Social Worker will follow up in 10 days .   Charles Lamb, BSW, [place] Managed Medicaid Team  [phone]   Following is a copy of your plan of care:  There are no care plans that you recently modified to display for this patient.

## 2021-12-19 NOTE — Telephone Encounter (Signed)
Opened by mistake.

## 2021-12-25 ENCOUNTER — Ambulatory Visit: Payer: Medicaid Other | Admitting: Unknown Physician Specialty

## 2021-12-29 ENCOUNTER — Ambulatory Visit (INDEPENDENT_AMBULATORY_CARE_PROVIDER_SITE_OTHER): Payer: Medicaid Other | Admitting: Physician Assistant

## 2021-12-29 ENCOUNTER — Ambulatory Visit: Payer: Medicaid Other

## 2021-12-29 ENCOUNTER — Encounter: Payer: Self-pay | Admitting: Physician Assistant

## 2021-12-29 VITALS — BP 121/83 | HR 71 | Temp 98.5°F | Ht 72.84 in | Wt 190.0 lb

## 2021-12-29 DIAGNOSIS — M65312 Trigger thumb, left thumb: Secondary | ICD-10-CM | POA: Diagnosis not present

## 2021-12-29 DIAGNOSIS — K760 Fatty (change of) liver, not elsewhere classified: Secondary | ICD-10-CM

## 2021-12-29 DIAGNOSIS — I10 Essential (primary) hypertension: Secondary | ICD-10-CM

## 2021-12-29 DIAGNOSIS — F1010 Alcohol abuse, uncomplicated: Secondary | ICD-10-CM

## 2021-12-29 NOTE — Progress Notes (Signed)
Established Patient Office Visit  Name: Charles Lamb   MRN: [id number]    DOB: 25-Apr-1960   Date:12/30/2021  Today's Provider: Jacquelin Hawking, MHS, PA-C Introduced myself to the patient as a PA-C and provided education on APPs in clinical practice.         Subjective  Chief Complaint  Chief Complaint  Patient presents with   Hyperlipidemia   Fatty Liver   Alcohol Problem    Hyperlipidemia Pertinent negatives include no chest pain or shortness of breath.  Alcohol Problem Pertinent negatives include no fever, nausea or vomiting.    HYPERTENSION / HYPERLIPIDEMIA Satisfied with current treatment? yes Duration of hypertension:  Unsure- not on medications and BP appears stable today  BP monitoring frequency: not checking BP range:  BP medication side effects: no Past BP meds: none Duration of hyperlipidemia: years Cholesterol medication side effects: no Cholesterol supplements: none Past cholesterol medications: rosuvastatin (crestor) Medication compliance: excellent compliance Aspirin: yes Recent stressors: no Recurrent headaches: no Visual changes: no Palpitations: no Dyspnea: no Chest pain: no Lower extremity edema: no Dizzy/lightheaded: no   Fatty liver  Reports he is mostly sober  He had a 40 oz recently with friends    Trigger finger of thumb - left thumb Reports it catches and locks up when he tries to flex    Patient Active Problem List   Diagnosis Date Noted   Trigger finger of left thumb 12/30/2021   Hyperlipidemia 09/10/2021   Screening for malignant neoplasm of prostate 09/10/2021   Chronic low back pain without sciatica 09/10/2021   Chronic right shoulder pain 06/10/2021   Rectal bleeding 04/09/2020   Hepatitis B 12/14/2018   Alcohol abuse 01/16/2018   Fatty liver 01/12/2018   Aortic atherosclerosis ([facility]) 01/12/2018   Hypercholesteremia 04/18/2012   Hypertension 08/19/2010   Proteinuria 06/12/2009    Past Surgical History:   Procedure Laterality Date   COLONOSCOPY WITH PROPOFOL N/A 02/07/2019   Procedure: COLONOSCOPY WITH PROPOFOL;  Surgeon: Wyline Mood, MD;  Location: [facility] ENDOSCOPY;  Service: Gastroenterology;  Laterality: N/A;   EYE SURGERY Left    FLEXIBLE SIGMOIDOSCOPY N/A 04/30/2020   Procedure: FLEXIBLE SIGMOIDOSCOPY;  Surgeon: Wyline Mood, MD;  Location: [facility] ENDOSCOPY;  Service: Gastroenterology;  Laterality: N/A;   TOTAL SHOULDER ARTHROPLASTY Left 05/25/2018   Procedure: LEFT SHOULDER HEMIARTHROPLASTY, BICEPS TENODESIS;  Surgeon: Lyndle Herrlich, MD;  Location: [facility] ORS;  Service: Orthopedics;  Laterality: Left;    Family History  Problem Relation Age of Onset   Kidney disease Mother    Kidney disease Brother     Social History   Tobacco Use   Smoking status: Every Day    Packs/day: 0.25    Types: Cigarettes   Smokeless tobacco: Never  Substance Use Topics   Alcohol use: Not Currently    Comment: 1 40oz a day     Current Outpatient Medications:    aspirin 81 MG EC tablet, Take 1 tablet (81 mg total) by mouth daily. Swallow whole., Disp: 30 tablet, Rfl: 12   diclofenac Sodium (VOLTAREN) 1 % GEL, Apply 2 g topically 4 (four) times daily., Disp: 2 g, Rfl: 1   hydrocortisone (ANUSOL-HC) 2.5 % rectal cream, Place 1 application rectally 2 (two) times daily., Disp: 28 g, Rfl: 0   rosuvastatin (CRESTOR) 20 MG tablet, Take 1 tablet (20 mg total) by mouth daily., Disp: 30 tablet, Rfl: 7  No Known Allergies  I personally reviewed active problem list, medication  list, allergies, health maintenance, notes from last encounter, lab results with the patient/caregiver today.   Review of Systems  Constitutional:  Negative for chills, fever and malaise/fatigue.  Eyes:  Negative for blurred vision and double vision.  Respiratory:  Negative for shortness of breath and wheezing.   Cardiovascular:  Negative for chest pain, palpitations and leg swelling.  Gastrointestinal:  Negative for blood in stool,  constipation, diarrhea, nausea and vomiting.  Neurological:  Negative for dizziness, tingling, tremors and headaches.  Psychiatric/Behavioral:  Negative for depression. The patient is not nervous/anxious and does not have insomnia.       Objective  Vitals:   12/29/21 1002  BP: 121/83  Pulse: 71  Temp: 98.5 F (36.9 C)  TempSrc: Oral  SpO2: 98%  Weight: 190 lb (86.2 kg)  Height: 6' 0.84" (1.85 m)    Body mass index is 25.18 kg/m.  Physical Exam Vitals reviewed.  Constitutional:      General: He is awake.     Appearance: Normal appearance. He is well-developed and well-groomed.  HENT:     Head: Normocephalic and atraumatic.     Mouth/Throat:     Mouth: Mucous membranes are moist.  Eyes:     Extraocular Movements: Extraocular movements intact.  Cardiovascular:     Rate and Rhythm: Normal rate and regular rhythm.     Pulses: Normal pulses.          Radial pulses are 2+ on the right side and 2+ on the left side.     Heart sounds: Normal heart sounds. No murmur heard.    No friction rub. No gallop.  Pulmonary:     Effort: Pulmonary effort is normal. No respiratory distress.     Breath sounds: Normal breath sounds. No decreased air movement. No decreased breath sounds, wheezing, rhonchi or rales.  Abdominal:     General: Abdomen is flat. Bowel sounds are normal.     Palpations: Abdomen is soft.  Skin:    General: Skin is warm.  Neurological:     General: No focal deficit present.     Mental Status: He is alert and oriented to person, place, and time.     Motor: No weakness.     Gait: Gait normal.  Psychiatric:        Mood and Affect: Mood normal.        Behavior: Behavior normal. Behavior is cooperative.        Thought Content: Thought content normal.        Judgment: Judgment normal.      No results found for this or any previous visit (from the past 2160 hour(s)).   PHQ2/9:    12/29/2021   10:07 AM 09/10/2021   10:52 AM 06/10/2021    9:31 AM 09/04/2020     2:22 PM 06/04/2020    9:58 AM  Depression screen PHQ 2/9  Decreased Interest 0 0 0 0 0  Down, Depressed, Hopeless 0 0 0 0 0  PHQ - 2 Score 0 0 0 0 0  Altered sleeping 0 0 0    Tired, decreased energy 0 0 0    Change in appetite 0 0 0    Feeling bad or failure about yourself  0 0 0    Trouble concentrating 0 0 0    Moving slowly or fidgety/restless 0 0 0    Suicidal thoughts 0 0 0    PHQ-9 Score 0 0 0    Difficult doing work/chores Not difficult  at all Not difficult at all Not difficult at all        Fall Risk:    12/29/2021   10:07 AM 09/10/2021    9:54 AM 06/10/2021    9:31 AM 09/04/2020    2:22 PM 06/04/2020    9:57 AM  Fall Risk   Falls in the past year? 0 0 0 0 0  Number falls in past yr: 0 0 0 0   Injury with Fall? 0 0 0 0   Risk for fall due to : No Fall Risks No Fall Risks No Fall Risks No Fall Risks No Fall Risks  Follow up Falls evaluation completed Falls evaluation completed Falls evaluation completed Falls evaluation completed       Functional Status Survey:      Assessment & Plan  Problem List Items Addressed This Visit       Cardiovascular and Mediastinum   Hypertension - Primary    Chronic, historic condition  Does not appear to be taking medications and BP is in goal today Recommend taking a few times per week at home for monitoring  He is taking Rosuvastatin regularly as directed Recommend diet and exercise to provide further control  Follow up in 3 months for monitoring and labs         Digestive   Fatty liver    Chronic, ongoing Previous liver enzymes are normal and stable  Recommend repeat in 3 months for monitoring  Reviewed refraining from alcohol use as he states he has been having a few alcoholic beverages every once in a while Recommend follow up in 3 months with labs          Musculoskeletal and Integument   Trigger finger of left thumb    Acute, new concern Reports left thumb is catching and locking up with flexion Will  refer to Orthopedics for evaluation and assistance with management Follow up as needed       Relevant Orders   Ambulatory referral to Orthopedic Surgery     Other   Alcohol abuse    Unsure how long he was misusing alcohol or how long his sobriety lasted He has resumed consuming alcohol - discussed drinking in moderation but recommend he refrain from this as tolerated given previous history Will continue to monitor and offer support to assist with sobriety goals         Return in about 3 months (around 03/31/2022) for HLD, fatty liver- labs.   I, Jasline Buskirk E Shephanie Romas, PA-C, have reviewed all documentation for this visit. The documentation on 12/30/21 for the exam, diagnosis, procedures, and orders are all accurate and complete.   Jacquelin Hawking, MHS, PA-C [facility]

## 2021-12-30 DIAGNOSIS — M65312 Trigger thumb, left thumb: Secondary | ICD-10-CM | POA: Insufficient documentation

## 2021-12-30 NOTE — Assessment & Plan Note (Signed)
Chronic, ongoing Previous liver enzymes are normal and stable  Recommend repeat in 3 months for monitoring  Reviewed refraining from alcohol use as he states he has been having a few alcoholic beverages every once in a while Recommend follow up in 3 months with labs

## 2021-12-30 NOTE — Assessment & Plan Note (Signed)
Acute, new concern Reports left thumb is catching and locking up with flexion Will refer to Orthopedics for evaluation and assistance with management Follow up as needed

## 2021-12-30 NOTE — Assessment & Plan Note (Signed)
Unsure how long he was misusing alcohol or how long his sobriety lasted He has resumed consuming alcohol - discussed drinking in moderation but recommend he refrain from this as tolerated given previous history Will continue to monitor and offer support to assist with sobriety goals

## 2021-12-30 NOTE — Assessment & Plan Note (Signed)
Chronic, historic condition  Does not appear to be taking medications and BP is in goal today Recommend taking a few times per week at home for monitoring  He is taking Rosuvastatin regularly as directed Recommend diet and exercise to provide further control  Follow up in 3 months for monitoring and labs

## 2022-01-01 ENCOUNTER — Other Ambulatory Visit: Payer: Self-pay

## 2022-01-01 NOTE — Patient Instructions (Signed)
Visit Information  Charles Lamb was given information about Medicaid Managed Care team care coordination services as a part of their [place] Medicaid benefit. [name] verbally consented to engagement with the [facility] Managed Care team.   If you are experiencing a medical emergency, please call 911 or report to your local emergency department or urgent care.   If you have a non-emergency medical problem during routine business hours, please contact your provider's office and ask to speak with a nurse.   For questions related to your [facility], please call: [phone] or visit the homepage here: [email or web address]  If you would like to schedule transportation through your [facility], please call the following number at least 2 days in advance of your appointment: [phone]   Rides for urgent appointments can also be made after hours by calling Member Services.  Call the [place] at [phone], at any time, 24 hours a day, 7 days a week. If you are in danger or need immediate medical attention call 911.  If you would like help to quit smoking, call 1-800-QUIT-NOW 629-637-6185) OR Espaol: 1-855-Djelo-Ya (6-712-458-0998) o para ms informacin haga clic aqu or Text READY to 200-400 to register via text  [name] - following are the goals we discussed in your visit today:   Goals Addressed   None      Social Worker will follow up in 1 day.   [name], BSW, [place] Managed Medicaid Team  [phone]   Following is a copy of your plan of care:  There are no care plans that you recently modified to display for this patient.

## 2022-01-01 NOTE — Patient Outreach (Signed)
  Medicaid Managed Care Social Work Note  01/01/2022 Name:  Charles Lamb MRN:  [id number] DOB:  07-23-1960  Charles Lamb is an 61 y.o. year old male who is a primary patient of Vigg, Avanti, MD (Inactive).  The [facility] Managed Care Coordination team was consulted for assistance with:  Community Resources   Mr. [place]hell was given information about Medicaid Managed Care Coordination team services today. Charles Lamb Patient agreed to services and verbal consent obtained.  Engaged with patient  for by telephone forfollow up visit in response to referral for case management and/or care coordination services.   Assessments/Interventions:  Review of past medical history, allergies, medications, health status, including review of consultants reports, laboratory and other test data, was performed as part of comprehensive evaluation and provision of chronic care management services.  SDOH: (Social Determinant of Health) assessments and interventions performed: BSW completed a telephone outreach with patient. He stated he has not received the resources BSW sent to him. BSW asked patient to give it a few more days and she would resend the resources. No additional resources are needed.   Advanced Directives Status:  Not addressed in this encounter.  Care Plan                 No Known Allergies  Medications Reviewed Today     Reviewed by Jerelene Redden, CMA (Certified Medical Assistant) on 12/29/21 at 1003  Med List Status: <None>   Medication Order Taking? Sig Documenting Provider Last Dose Status Informant  aspirin 81 MG EC tablet [id number] Yes Take 1 tablet (81 mg total) by mouth daily. Swallow whole. Charlynne Cousins, MD Taking Active   diclofenac Sodium (VOLTAREN) 1 % GEL [id number] Yes Apply 2 g topically 4 (four) times daily. Charlynne Cousins, MD Taking Active   hydrocortisone (ANUSOL-HC) 2.5 % rectal cream [id number] Yes Place 1 application rectally 2 (two) times daily. Jonathon Bellows, MD  Taking Active   rosuvastatin (CRESTOR) 20 MG tablet [id number] Yes Take 1 tablet (20 mg total) by mouth daily. Venita Lick, NP Taking Active             Patient Active Problem List   Diagnosis Date Noted   Trigger finger of left thumb 12/30/2021   Hyperlipidemia 09/10/2021   Screening for malignant neoplasm of prostate 09/10/2021   Chronic low back pain without sciatica 09/10/2021   Chronic right shoulder pain 06/10/2021   Rectal bleeding 04/09/2020   Hepatitis B 12/14/2018   Alcohol abuse 01/16/2018   Fatty liver 01/12/2018   Aortic atherosclerosis ([place]) 01/12/2018   Hypercholesteremia 04/18/2012   Hypertension 08/19/2010   Proteinuria 06/12/2009    Conditions to be addressed/monitored per PCP order:   community resources  There are no care plans that you recently modified to display for this patient.   Follow up:  Patient agrees to Care Plan and Follow-up.  Plan: The Managed Medicaid care management team will reach out to the patient again over the next 1 days.  Date/time of next scheduled Social Work care management/care coordination outreach:  01/02/22  [name], Arita Miss, [place] Managed Medicaid Team  [phone]

## 2022-01-05 ENCOUNTER — Other Ambulatory Visit: Payer: Self-pay

## 2022-01-05 NOTE — Patient Instructions (Signed)
Visit Information  Charles Lamb was given information about Medicaid Managed Care team care coordination services as a part of their [place] Medicaid benefit. Charles Lamb verbally consented to engagement with the [facility] Managed Care team.   If you are experiencing a medical emergency, please call 911 or report to your local emergency department or urgent care.   If you have a non-emergency medical problem during routine business hours, please contact your provider's office and ask to speak with a nurse.   For questions related to your [facility], please call: [phone] or visit the homepage here: [email or web address]  If you would like to schedule transportation through your [facility], please call the following number at least 2 days in advance of your appointment: [phone]   Rides for urgent appointments can also be made after hours by calling Member Services.  Call the [place] at [phone], at any time, 24 hours a day, 7 days a week. If you are in danger or need immediate medical attention call 911.  If you would like help to quit smoking, call 1-800-QUIT-NOW 7870848091) OR Espaol: 1-855-Djelo-Ya (7-939-030-0923) o para ms informacin haga clic aqu or Text READY to 200-400 to register via text  Charles Lamb - following are the goals we discussed in your visit today:   Goals Addressed   None      Social Worker will follow up in 14 days .   [name], BSW, [place] Managed Medicaid Team  [phone]   Following is a copy of your plan of care:  There are no care plans that you recently modified to display for this patient.

## 2022-01-05 NOTE — Patient Outreach (Signed)
  Medicaid Managed Care Social Work Note  01/05/2022 Name:  Charles Lamb MRN:  [id number] DOB:  1960/10/18  Charles Lamb is an 61 y.o. year old male who is a primary patient of Vigg, Avanti, MD (Inactive).  The [facility] Managed Care Coordination team was consulted for assistance with:  Community Resources   Mr. Opdahl was given information about Medicaid Managed Care Coordination team services today. Charles Lamb Patient agreed to services and verbal consent obtained.  Engaged with patient  for by telephone forfollow up visit in response to referral for case management and/or care coordination services.   Assessments/Interventions:  Review of past medical history, allergies, medications, health status, including review of consultants reports, laboratory and other test data, was performed as part of comprehensive evaluation and provision of chronic care management services.  SDOH: (Social Determinant of Health) assessments and interventions performed: BSW completed a telephone outreach with patient, he stated he did not receive the letter of resources BSW mailed. BSW will have resources remailed to patient. BSW also received a message to verify patients PCP, His new PCP is Tax adviser at [facility].   Advanced Directives Status:  Not addressed in this encounter.  Care Plan                 No Known Allergies  Medications Reviewed Today     Reviewed by Jerelene Redden, CMA (Certified Medical Assistant) on 12/29/21 at 1003  Med List Status: <None>   Medication Order Taking? Sig Documenting Provider Last Dose Status Informant  aspirin 81 MG EC tablet [id number] Yes Take 1 tablet (81 mg total) by mouth daily. Swallow whole. Charlynne Cousins, MD Taking Active   diclofenac Sodium (VOLTAREN) 1 % GEL [id number] Yes Apply 2 g topically 4 (four) times daily. Charlynne Cousins, MD Taking Active   hydrocortisone (ANUSOL-HC) 2.5 % rectal cream [id number] Yes Place 1 application rectally  2 (two) times daily. Jonathon Bellows, MD Taking Active   rosuvastatin (CRESTOR) 20 MG tablet [id number] Yes Take 1 tablet (20 mg total) by mouth daily. Venita Lick, NP Taking Active             Patient Active Problem List   Diagnosis Date Noted   Trigger finger of left thumb 12/30/2021   Hyperlipidemia 09/10/2021   Screening for malignant neoplasm of prostate 09/10/2021   Chronic low back pain without sciatica 09/10/2021   Chronic right shoulder pain 06/10/2021   Rectal bleeding 04/09/2020   Hepatitis B 12/14/2018   Alcohol abuse 01/16/2018   Fatty liver 01/12/2018   Aortic atherosclerosis ([place]) 01/12/2018   Hypercholesteremia 04/18/2012   Hypertension 08/19/2010   Proteinuria 06/12/2009    Conditions to be addressed/monitored per PCP order:   community resources  There are no care plans that you recently modified to display for this patient.   Follow up:  Patient agrees to Care Plan and Follow-up.  Plan: The Managed Medicaid care management team will reach out to the patient again over the next 14 days.  Date/time of next scheduled Social Work care management/care coordination outreach:  01/23/22  [name], Arita Miss, [place] Medicaid Team  [phone]

## 2022-01-14 DIAGNOSIS — M65312 Trigger thumb, left thumb: Secondary | ICD-10-CM | POA: Diagnosis not present

## 2022-01-20 ENCOUNTER — Other Ambulatory Visit: Payer: Self-pay

## 2022-01-20 ENCOUNTER — Encounter: Payer: Self-pay | Admitting: Gastroenterology

## 2022-01-20 ENCOUNTER — Ambulatory Visit (INDEPENDENT_AMBULATORY_CARE_PROVIDER_SITE_OTHER): Payer: Medicaid Other | Admitting: Gastroenterology

## 2022-01-20 VITALS — BP 148/81 | HR 82 | Ht 73.0 in | Wt 193.2 lb

## 2022-01-20 DIAGNOSIS — K625 Hemorrhage of anus and rectum: Secondary | ICD-10-CM

## 2022-01-20 DIAGNOSIS — K648 Other hemorrhoids: Secondary | ICD-10-CM | POA: Diagnosis not present

## 2022-01-20 NOTE — Progress Notes (Signed)
Wyline Mood MD, MRCP(U.K) [address]  [address]  [place], [place] [address]  Main: [phone]  Fax: [phone]   Primary Care Physician: Loura Pardon, MD (Inactive)  Primary Gastroenterologist:  Dr. Wyline Mood   Chief Complaint  Patient presents with   Rectal Bleeding    HPI: Charles Lamb is a 61 y.o. male   Summary of history :   He has a history of hepatitis B spontaneously cleared in the past.  Seen in January 2022 for blood on the tissue paper the toilet bowl every time he had a bowel movement. Some perianal itching. Not on any blood thinners except  aspirin. Denied  any constipation. Diet was very poor in    02/14/2019: Hep B viral load- negative , Hep B e ab , Hep B core ab , positive    08/16/2019: Hep B core ab positive, HBV,Hep B e antigen,Bsag ,   not detected . LFT's normal .  02/07/2020: Colonoscopy : Diverticulosis of colon and internal hemorrhoids. Repeat colonoscopy in 10 years.  04/30/2020: Flexible sigmoidoscopy : Large internal hemorrhoids and diverticulosis of sigmoid colon seen .        Interval history 05/27/2020-01/20/2022   Since his last visit he has had multiple episodes of blood in the tissue paper with some perianal itching denies any constipation.  Tried conservative management but failed.  Would like more definitive treatment  PROCEDURE NOTE: The patient presents with symptomatic grade 1 hemorrhoids, unresponsive to maximal medical therapy, requesting rubber band ligation of his/her hemorrhoidal disease.  All risks, benefits and alternative forms of therapy were described and informed consent was obtained.  In the Left Lateral Decubitus position (if anoscopy is performed) anoscopic examination revealed grade 2 hemorrhoids in the all position(s).   The decision was made to band the LL internal hemorrhoid, and the [facility] System was used to perform band ligation without complication.  Digital anorectal examination was then performed to  assure proper positioning of the band, and to adjust the banded tissue as required.  The patient was discharged home without pain or other issues.  Dietary and behavioral recommendations were given and (if necessary - prescriptions were given), along with follow-up instructions.  The patient will return 4 weeks for follow-up and possible additional banding as required.  No complications were encountered and the patient tolerated the procedure well.     Current Outpatient Medications  Medication Sig Dispense Refill   aspirin 81 MG EC tablet Take 1 tablet (81 mg total) by mouth daily. Swallow whole. 30 tablet 12   diclofenac Sodium (VOLTAREN) 1 % GEL Apply 2 g topically 4 (four) times daily. 2 g 1   hydrocortisone (ANUSOL-HC) 2.5 % rectal cream Place 1 application rectally 2 (two) times daily. 28 g 0   meloxicam (MOBIC) 15 MG tablet Take 15 mg by mouth daily.     rosuvastatin (CRESTOR) 20 MG tablet Take 1 tablet (20 mg total) by mouth daily. 30 tablet 7   No current facility-administered medications for this visit.    Allergies as of 01/20/2022   (No Known Allergies)    ROS:  General: Negative for anorexia, weight loss, fever, chills, fatigue, weakness. ENT: Negative for hoarseness, difficulty swallowing , nasal congestion. CV: Negative for chest pain, angina, palpitations, dyspnea on exertion, peripheral edema.  Respiratory: Negative for dyspnea at rest, dyspnea on exertion, cough, sputum, wheezing.  GI: See history of present illness. GU:  Negative for dysuria, hematuria, urinary incontinence, urinary frequency,  nocturnal urination.  Endo: Negative for unusual weight change.    Physical Examination:   BP (!) 148/81   Pulse 82   Ht 6\' 1"  (1.854 m)   Wt 193 lb 3.2 oz (87.6 kg)   BMI 25.49 kg/m   General: Well-nourished, well-developed in no acute distress.  Skin: Warm and dry, no jaundice.   Psych: Alert and cooperative, normal mood and affect.   Imaging Studies: No  results found.  Assessment and Plan:   Charles Lamb is a 61 y.o. y/o male for bleeding associated with every bowel movement secondary to internal hemorrhoids.  Failed conservative management   Dr Jonathon Bellows  MD,MRCP [facility]) Follow up in 4 weeks

## 2022-01-26 ENCOUNTER — Other Ambulatory Visit: Payer: Medicaid Other

## 2022-01-26 NOTE — Patient Instructions (Signed)
Visit Information  Mr. Deshotel was given information about Medicaid Managed Care team care coordination services as a part of their [facility] Community Plan Medicaid benefit. Henderson Cloud verbally consented to engagement with the [facility] Managed Care team.   If you are experiencing a medical emergency, please call 911 or report to your local emergency department or urgent care.   If you have a non-emergency medical problem during routine business hours, please contact your provider's office and ask to speak with a nurse.   For questions related to your [facility], please call: [phone] or visit the homepage here: [email or web address]  If you would like to schedule transportation through your [facility], please call the following number at least 2 days in advance of your appointment: [phone]   Rides for urgent appointments can also be made after hours by calling Member Services.  Call the Behavioral Health Crisis Line at [phone], at any time, 24 hours a day, 7 days a week. If you are in danger or need immediate medical attention call 911.  If you would like help to quit smoking, call 1-800-QUIT-NOW ((531)630-1299) OR Espaol: 1-855-Djelo-Ya (5-374-827-0786) o para ms informacin haga clic aqu or Text READY to 754-492 to register via text  Mr. Valdez - following are the goals we discussed in your visit today:   Goals Addressed   None      Social Worker will follow up in 03/12/22.   Gus Puma, BSW, [facility]    High Risk Managed Medicaid Team  [phone]   Following is a copy of your plan of care:  There are no care plans that you recently modified to display for this patient.

## 2022-01-26 NOTE — Patient Outreach (Signed)
  Medicaid Managed Care Social Work Note  01/26/2022 Name:  Charles Lamb MRN:  [id number] DOB:  Jan 21, 1961  Charles Lamb is an 61 y.o. year old male who is a primary patient of Vigg, Avanti, MD (Inactive).  The [facility] Managed Care Coordination team was consulted for assistance with:  Community Resources   [name] was given information about Medicaid Managed Care Coordination team services today. Charles Lamb Patient agreed to services and verbal consent obtained.  Engaged with patient  for by telephone forfollow up visit in response to referral for case management and/or care coordination services.   Assessments/Interventions:  Review of past medical history, allergies, medications, health status, including review of consultants reports, laboratory and other test data, was performed as part of comprehensive evaluation and provision of chronic care management services.  SDOH: (Social Determinant of Health) assessments and interventions performed: BSW completed a telephone outreach with patient. He stated he did receive the resources, but they are not going to help unless he has a cut off notice. Patient states he will not not pay his bill just to get assistance. No other resources are needed at this time.  Advanced Directives Status:  Not addressed in this encounter.  Care Plan                 No Known Allergies  Medications Reviewed Today     Reviewed by Adela Ports, CMA (Certified Medical Assistant) on 01/20/22 at 1306  Med List Status: <None>   Medication Order Taking? Sig Documenting Provider Last Dose Status Informant  aspirin 81 MG EC tablet [id number] Yes Take 1 tablet (81 mg total) by mouth daily. Swallow whole. Loura Pardon, MD Taking Active   diclofenac Sodium (VOLTAREN) 1 % GEL [id number] Yes Apply 2 g topically 4 (four) times daily. Loura Pardon, MD Taking Active   hydrocortisone (ANUSOL-HC) 2.5 % rectal cream [id number] Yes Place 1 application rectally 2  (two) times daily. Wyline Mood, MD Taking Active   meloxicam [facility]) 15 MG tablet [id number] Yes Take 15 mg by mouth daily. [provider] Taking Active   rosuvastatin (CRESTOR) 20 MG tablet [id number] Yes Take 1 tablet (20 mg total) by mouth daily. Marjie Skiff, NP Taking Active             Patient Active Problem List   Diagnosis Date Noted   Trigger finger of left thumb 12/30/2021   Hyperlipidemia 09/10/2021   Screening for malignant neoplasm of prostate 09/10/2021   Chronic low back pain without sciatica 09/10/2021   Chronic right shoulder pain 06/10/2021   Rectal bleeding 04/09/2020   Hepatitis B 12/14/2018   Alcohol abuse 01/16/2018   Fatty liver 01/12/2018   Aortic atherosclerosis ([facility]) 01/12/2018   Hypercholesteremia 04/18/2012   Hypertension 08/19/2010   Proteinuria 06/12/2009    Conditions to be addressed/monitored per PCP order:   community resources  There are no care plans that you recently modified to display for this patient.   Follow up:  Patient agrees to Care Plan and Follow-up.  Plan: The Managed Medicaid care management team will reach out to the patient again over the next 30-60 days.  Date/time of next scheduled Social Work care management/care coordination outreach:  03/12/22  Gus Puma, Kenard Gower, [facility]  [facility]  High Risk Managed Medicaid Team  [phone]

## 2022-02-19 ENCOUNTER — Ambulatory Visit: Payer: Medicaid Other | Admitting: Gastroenterology

## 2022-03-29 NOTE — Patient Instructions (Signed)

## 2022-03-31 ENCOUNTER — Encounter: Payer: Self-pay | Admitting: Nurse Practitioner

## 2022-03-31 ENCOUNTER — Ambulatory Visit (INDEPENDENT_AMBULATORY_CARE_PROVIDER_SITE_OTHER): Payer: Medicaid Other | Admitting: Nurse Practitioner

## 2022-03-31 VITALS — BP 129/77 | HR 72 | Temp 97.8°F | Ht 72.99 in | Wt 194.3 lb

## 2022-03-31 DIAGNOSIS — E7800 Pure hypercholesterolemia, unspecified: Secondary | ICD-10-CM | POA: Diagnosis not present

## 2022-03-31 DIAGNOSIS — B181 Chronic viral hepatitis B without delta-agent: Secondary | ICD-10-CM

## 2022-03-31 DIAGNOSIS — I700 Atherosclerosis of aorta: Secondary | ICD-10-CM | POA: Diagnosis not present

## 2022-03-31 DIAGNOSIS — R42 Dizziness and giddiness: Secondary | ICD-10-CM | POA: Diagnosis not present

## 2022-03-31 DIAGNOSIS — K760 Fatty (change of) liver, not elsewhere classified: Secondary | ICD-10-CM

## 2022-03-31 DIAGNOSIS — K625 Hemorrhage of anus and rectum: Secondary | ICD-10-CM

## 2022-03-31 DIAGNOSIS — F1010 Alcohol abuse, uncomplicated: Secondary | ICD-10-CM

## 2022-03-31 DIAGNOSIS — R801 Persistent proteinuria, unspecified: Secondary | ICD-10-CM

## 2022-03-31 DIAGNOSIS — I10 Essential (primary) hypertension: Secondary | ICD-10-CM

## 2022-03-31 DIAGNOSIS — R9431 Abnormal electrocardiogram [ECG] [EKG]: Secondary | ICD-10-CM

## 2022-03-31 MED ORDER — ASPIRIN 81 MG PO TBEC: 81.0000 mg | DELAYED_RELEASE_TABLET | Freq: Every day | ORAL | 4 refills | Status: AC

## 2022-03-31 NOTE — Assessment & Plan Note (Signed)
With ST elevation, possible old MI -- he denies history of such + LVH.  With current dizziness over past two weeks would benefit cardiology referral, urgent referral placed.  Continue statin and ASA daily.  Plan on return in 4 weeks, sooner if worsening.

## 2022-03-31 NOTE — Assessment & Plan Note (Addendum)
Chronic, ongoing.  Continue statin daily and will continue ASA.  Recommend complete cessation of smoking.

## 2022-03-31 NOTE — Assessment & Plan Note (Signed)
Ongoing for over 2 weeks with noted carotid bruit right side and abnormal EKG.  Urgent referral to cardiology placed to further assess.  Ordered imaging of carotids, will send to vascular if blockages noted.  Recommend continue ASA and statin daily.  Cut back on smoking.  Neuro exam in office with no red flag findings. Return in 4 weeks.

## 2022-03-31 NOTE — Assessment & Plan Note (Signed)
Noted on past labs, check urine ALB today and consider starting low dose ARB (avoid ACE as is smoker and concern for lung disease) for kidney protection.

## 2022-03-31 NOTE — Assessment & Plan Note (Signed)
Noted on imaging 2019, he denies heavy alcohol use.  Recommend cut back on current use.  Check labs today.

## 2022-03-31 NOTE — Assessment & Plan Note (Signed)
He denies heavy use, recommend cut back and discussed benefit to overall health.

## 2022-03-31 NOTE — Assessment & Plan Note (Signed)
Chronic, ongoing.  Continue current medication regimen and adjust as needed. Lipid panel today. 

## 2022-03-31 NOTE — Assessment & Plan Note (Signed)
Followed by GI, reports improvement in this.  Check CBC, iron, ferritin today due to dizziness recently.

## 2022-03-31 NOTE — Progress Notes (Addendum)
BP 129/77   Pulse 72   Temp 97.8 F (36.6 C) (Oral)   Ht 6' 0.99" (1.854 m)   Wt 194 lb 4.8 oz (88.1 kg)   SpO2 98%   BMI 25.64 kg/m    Subjective:    Patient ID: Charles Lamb, male    DOB: 21-Mar-1960, 62 y.o.   MRN: [id number]  HPI: Charles Lamb is a 62 y.o. male  Chief Complaint  Patient presents with   Hyperlipidemia   Fatty Liver   Dizziness    For the past week or 2, happens when he stands up or raises his hands over his head    HYPERTENSION / HYPERLIPIDEMIA Continues on Rosuvastatin, no current BP medications.  Saw Dr. Tobi Bastos recently with GI, last on 01/20/22 -- for rectal bleeding + fatty liver.  Does endorse alcohol use, although no daily use -- reports every now and then only.    Current smoker 3-5 cigarettes a day, has smoked since age 68.  Never a pack per day smoker. A pack lasts 3 days.   Satisfied with current treatment? yes Duration of hypertension: chronic Duration of hyperlipidemia: chronic Cholesterol medication side effects: no Cholesterol supplements: none Medication compliance: good compliance Aspirin: no Recent stressors: no Recurrent headaches: no Visual changes: no Palpitations: no Dyspnea: no Chest pain: no Lower extremity edema: no Dizzy/lightheaded: yes   DIZZINESS Started about 2 weeks ago -- no falls or injuries at time.   Duration: weeks Description of symptoms: lightheaded Duration of episode: minutes 2-3 or less Dizziness frequency: recurrent Provoking factors: sitting to standing Aggravating factors:  going from sitting to standing, then raising arm up Triggered by rolling over in bed: no Triggered by bending over: no Aggravated by head movement: no Aggravated by exertion, coughing, loud noises: no Recent head injury: no Recent or current viral symptoms: no History of vasovagal episodes: no Nausea: no Vomiting: no Tinnitus: no Hearing loss: no Aural fullness: no Headache: no Photophobia/phonophobia:  no Unsteady gait: no Postural instability: no Diplopia, dysarthria, dysphagia or weakness: no Related to exertion: no Pallor: no Diaphoresis: no Dyspnea: no Chest pain: no   Relevant past medical, surgical, family and social history reviewed and updated as indicated. Interim medical history since our last visit reviewed. Allergies and medications reviewed and updated.  Review of Systems  Constitutional:  Negative for activity change, appetite change, chills, fatigue and fever.  HENT: Negative.    Respiratory:  Negative for cough, chest tightness, shortness of breath and wheezing.   Cardiovascular:  Negative for chest pain, palpitations and leg swelling.  Gastrointestinal: Negative.   Neurological:  Positive for dizziness. Negative for syncope, facial asymmetry, weakness and headaches.  Psychiatric/Behavioral: Negative.     Per HPI unless specifically indicated above     Objective:    BP 129/77   Pulse 72   Temp 97.8 F (36.6 C) (Oral)   Ht 6' 0.99" (1.854 m)   Wt 194 lb 4.8 oz (88.1 kg)   SpO2 98%   BMI 25.64 kg/m   Wt Readings from Last 3 Encounters:  03/31/22 194 lb 4.8 oz (88.1 kg)  01/20/22 193 lb 3.2 oz (87.6 kg)  12/29/21 190 lb (86.2 kg)    Physical Exam Vitals and nursing note reviewed.  Constitutional:      General: He is awake. He is not in acute distress.    Appearance: He is well-developed and well-groomed. He is not ill-appearing or toxic-appearing.  HENT:     Head: Normocephalic.  Right Ear: Hearing and external ear normal.     Left Ear: Hearing and external ear normal.  Eyes:     General: Lids are normal.     Extraocular Movements: Extraocular movements intact.     Conjunctiva/sclera: Conjunctivae normal.  Neck:     Thyroid: No thyromegaly.     Vascular: Carotid bruit (noted to right side) present.  Cardiovascular:     Rate and Rhythm: Normal rate and regular rhythm.     Heart sounds: No murmur heard.    Gallop present. S3 sounds present.   Pulmonary:     Effort: No accessory muscle usage or respiratory distress.     Breath sounds: Normal breath sounds.  Abdominal:     General: Bowel sounds are normal. There is no distension.     Palpations: Abdomen is soft.     Tenderness: There is no abdominal tenderness.  Musculoskeletal:     Cervical back: Full passive range of motion without pain.     Right lower leg: No edema.     Left lower leg: No edema.  Lymphadenopathy:     Cervical: No cervical adenopathy.  Skin:    General: Skin is warm.     Capillary Refill: Capillary refill takes less than 2 seconds.  Neurological:     Mental Status: He is alert and oriented to person, place, and time.     Cranial Nerves: Cranial nerves 2-12 are intact.     Motor: Motor function is intact.     Coordination: Coordination is intact.     Gait: Gait is intact.     Deep Tendon Reflexes: Reflexes are normal and symmetric.     Reflex Scores:      Brachioradialis reflexes are 2+ on the right side and 2+ on the left side.      Patellar reflexes are 2+ on the right side and 2+ on the left side. Psychiatric:        Attention and Perception: Attention normal.        Mood and Affect: Mood normal.        Speech: Speech normal.        Behavior: Behavior normal. Behavior is cooperative.        Thought Content: Thought content normal.   EKG My review and personal interpretation at Time: 1100    Indication:abnormal heart sounds/dizziness Rate: 66  Rhythm: sinus Axis: normal Other: ST elevation in V leads with LVH  Results for orders placed or performed in visit on 09/10/21  CBC with Differential/Platelet  Result Value Ref Range   WBC 5.2 3.4 - 10.8 x10E3/uL   RBC 4.65 4.14 - 5.80 x10E6/uL   Hemoglobin 14.2 13.0 - 17.7 g/dL   Hematocrit 42.7 37.5 - 51.0 %   MCV 92 79 - 97 fL   MCH 30.5 26.6 - 33.0 pg   MCHC 33.3 31.5 - 35.7 g/dL   RDW 12.9 11.6 - 15.4 %   Platelets 195 150 - 450 x10E3/uL   Neutrophils 58 Not Estab. %   Lymphs 29 Not Estab.  %   Monocytes 8 Not Estab. %   Eos 4 Not Estab. %   Basos 1 Not Estab. %   Neutrophils Absolute 3.0 1.4 - 7.0 x10E3/uL   Lymphocytes Absolute 1.5 0.7 - 3.1 x10E3/uL   Monocytes Absolute 0.4 0.1 - 0.9 x10E3/uL   EOS (ABSOLUTE) 0.2 0.0 - 0.4 x10E3/uL   Basophils Absolute 0.0 0.0 - 0.2 x10E3/uL   Immature Granulocytes 0 Not Estab. %  Immature Grans (Abs) 0.0 0.0 - 0.1 x10E3/uL  Comprehensive metabolic panel  Result Value Ref Range   Glucose 119 (H) 70 - 99 mg/dL   BUN 13 6 - 24 mg/dL   Creatinine, Ser 1.03 0.76 - 1.27 mg/dL   eGFR 84 >59 mL/min/1.73   BUN/Creatinine Ratio 13 9 - 20   Sodium 140 134 - 144 mmol/L   Potassium 4.0 3.5 - 5.2 mmol/L   Chloride 104 96 - 106 mmol/L   CO2 21 20 - 29 mmol/L   Calcium 9.5 8.7 - 10.2 mg/dL   Total Protein 7.1 6.0 - 8.5 g/dL   Albumin 4.5 3.8 - 4.9 g/dL   Globulin, Total 2.6 1.5 - 4.5 g/dL   Albumin/Globulin Ratio 1.7 1.2 - 2.2   Bilirubin Total <0.2 0.0 - 1.2 mg/dL   Alkaline Phosphatase 68 44 - 121 IU/L   AST 24 0 - 40 IU/L   ALT 21 0 - 44 IU/L  TSH  Result Value Ref Range   TSH 1.230 0.450 - 4.500 uIU/mL  PSA  Result Value Ref Range   Prostate Specific Ag, Serum 1.3 0.0 - 4.0 ng/mL  Lipid panel  Result Value Ref Range   Cholesterol, Total 163 100 - 199 mg/dL   Triglycerides 104 0 - 149 mg/dL   HDL 60 >39 mg/dL   VLDL Cholesterol Cal 19 5 - 40 mg/dL   LDL Chol Calc (NIH) 84 0 - 99 mg/dL   Chol/HDL Ratio 2.7 0.0 - 5.0 ratio      Assessment & Plan:   Problem List Items Addressed This Visit       Cardiovascular and Mediastinum   Aortic atherosclerosis (HCC) - Primary    Chronic, ongoing.  Continue statin daily and will continue ASA.  Recommend complete cessation of smoking.      Relevant Medications   aspirin EC 81 MG tablet   Other Relevant Orders   Comprehensive metabolic panel   Lipid Panel w/o Chol/HDL Ratio   Hypertension    Chronic, stable with BP at goal in office today.  Recommend he monitor BP at least a few  mornings a week at home and document.  DASH diet at home.  No current medications, may benefit restart of ARB in future if elevations present (previously was on Lisinopril, would avoid ACE due to smoker).  Labs today: CBC, CMP, TSH, urine ALB.         Relevant Medications   aspirin EC 81 MG tablet   Other Relevant Orders   CBC with Differential/Platelet   Comprehensive metabolic panel   TSH   Urine Microalbumin w/creat. ratio     Digestive   Fatty liver    Noted on imaging 2019, he denies heavy alcohol use.  Recommend cut back on current use.  Check labs today.      Relevant Orders   Comprehensive metabolic panel   Rectal bleeding    Followed by GI, reports improvement in this.  Check CBC, iron, ferritin today due to dizziness recently.      Relevant Orders   Iron Binding Cap (TIBC)([facility])   Ferritin     Other   Abnormal EKG    With ST elevation, possible old MI -- he denies history of such + LVH.  With current dizziness over past two weeks would benefit cardiology referral, urgent referral placed.  Continue statin and ASA daily.  Plan on return in 4 weeks, sooner if worsening.      Relevant Orders  Ambulatory referral to Cardiology   Alcohol abuse    He denies heavy use, recommend cut back and discussed benefit to overall health.      Relevant Orders   Comprehensive metabolic panel   Vitamin B12   Dizziness    Ongoing for over 2 weeks with noted carotid bruit right side and abnormal EKG.  Urgent referral to cardiology placed to further assess.  Ordered imaging of carotids, will send to vascular if blockages noted.  Recommend continue ASA and statin daily.  Cut back on smoking.  Neuro exam in office with no red flag findings. Return in 4 weeks.      Relevant Orders   EKG 12-Lead (Completed)   US Carotid Duplex Bilateral   Hypercholesteremia    Chronic, ongoing.  Continue current medication regimen and adjust as needed.  Lipid panel today.          Relevant Medications   aspirin EC 81 MG tablet   Other Relevant Orders   Comprehensive metabolic panel   Lipid Panel w/o Chol/HDL Ratio   Proteinuria    Noted on past labs, check urine ALB today and consider starting low dose ARB (avoid ACE as is smoker and concern for lung disease) for kidney protection.        Follow up plan: Return in about 4 weeks (around 04/28/2022) for Dizziness and Heart Check.

## 2022-03-31 NOTE — Assessment & Plan Note (Signed)
Chronic, stable with BP at goal in office today.  Recommend he monitor BP at least a few mornings a week at home and document.  DASH diet at home.  No current medications, may benefit restart of ARB in future if elevations present (previously was on Lisinopril, would avoid ACE due to smoker).  Labs today: CBC, CMP, TSH, urine ALB.

## 2022-04-01 ENCOUNTER — Other Ambulatory Visit: Payer: Self-pay | Admitting: Nurse Practitioner

## 2022-04-01 LAB — TSH: TSH: 2.29 u[IU]/mL (ref 0.450–4.500)

## 2022-04-01 LAB — COMPREHENSIVE METABOLIC PANEL
ALT: 25 IU/L (ref 0–44)
AST: 24 IU/L (ref 0–40)
Albumin/Globulin Ratio: 1.6 (ref 1.2–2.2)
Albumin: 4.6 g/dL (ref 3.8–4.9)
Alkaline Phosphatase: 59 IU/L (ref 44–121)
BUN/Creatinine Ratio: 10 (ref 10–24)
BUN: 11 mg/dL (ref 8–27)
Bilirubin Total: 0.3 mg/dL (ref 0.0–1.2)
CO2: 23 mmol/L (ref 20–29)
Calcium: 9.5 mg/dL (ref 8.6–10.2)
Chloride: 100 mmol/L (ref 96–106)
Creatinine, Ser: 1.09 mg/dL (ref 0.76–1.27)
Globulin, Total: 2.8 g/dL (ref 1.5–4.5)
Glucose: 91 mg/dL (ref 70–99)
Potassium: 4.4 mmol/L (ref 3.5–5.2)
Sodium: 135 mmol/L (ref 134–144)
Total Protein: 7.4 g/dL (ref 6.0–8.5)
eGFR: 78 mL/min/{1.73_m2} (ref >59)

## 2022-04-01 LAB — MICROALBUMIN / CREATININE URINE RATIO
Creatinine, Urine: 104.5 mg/dL
Microalb/Creat Ratio: 28 mg/g creat (ref 0–29)
Microalbumin, Urine: 29.6 ug/mL

## 2022-04-01 LAB — IRON AND TIBC
Iron Saturation: 31 % (ref 15–55)
Iron: 125 ug/dL (ref 38–169)
Total Iron Binding Capacity: 409 ug/dL (ref 250–450)
UIBC: 284 ug/dL (ref 111–343)

## 2022-04-01 LAB — CBC WITH DIFFERENTIAL/PLATELET
Basophils Absolute: 0.1 10*3/uL (ref 0.0–0.2)
Basos: 1 %
EOS (ABSOLUTE): 0.1 10*3/uL (ref 0.0–0.4)
Eos: 1 %
Hematocrit: 41.5 % (ref 37.5–51.0)
Hemoglobin: 14.5 g/dL (ref 13.0–17.7)
Immature Grans (Abs): 0 10*3/uL (ref 0.0–0.1)
Immature Granulocytes: 0 %
Lymphocytes Absolute: 1.7 10*3/uL (ref 0.7–3.1)
Lymphs: 30 %
MCH: 32.1 pg (ref 26.6–33.0)
MCHC: 34.9 g/dL (ref 31.5–35.7)
MCV: 92 fL (ref 79–97)
Monocytes Absolute: 0.5 10*3/uL (ref 0.1–0.9)
Monocytes: 9 %
Neutrophils Absolute: 3.4 10*3/uL (ref 1.4–7.0)
Neutrophils: 59 %
Platelets: 203 10*3/uL (ref 150–450)
RBC: 4.52 x10E6/uL (ref 4.14–5.80)
RDW: 13.2 % (ref 11.6–15.4)
WBC: 5.7 10*3/uL (ref 3.4–10.8)

## 2022-04-01 LAB — LIPID PANEL W/O CHOL/HDL RATIO
Cholesterol, Total: 192 mg/dL (ref 100–199)
HDL: 70 mg/dL (ref >39)
LDL Chol Calc (NIH): 95 mg/dL (ref 0–99)
Triglycerides: 159 mg/dL — ABNORMAL HIGH (ref 0–149)
VLDL Cholesterol Cal: 27 mg/dL (ref 5–40)

## 2022-04-01 LAB — FERRITIN: Ferritin: 86 ng/mL (ref 30–400)

## 2022-04-01 LAB — VITAMIN B12: Vitamin B-12: 413 pg/mL (ref 232–1245)

## 2022-04-01 MED ORDER — ROSUVASTATIN CALCIUM 40 MG PO TABS: 40.0000 mg | ORAL_TABLET | Freq: Every day | ORAL | 3 refills | Status: DC

## 2022-04-01 NOTE — Progress Notes (Signed)
Good afternoon, please let Charles Lamb know his labs have returned: - Cholesterol labs are above goal where I would like them for heart protection.  I am going to increase Rosuvastatin to 40 MG and want you to stop the 20 MG dosing.  We will recheck next visit. - Remainder of labs are all stable with exception of B12 level is on lower side of normal.  I do recommend taking Vitamin B12 over the counter 1000 MCG daily to help with nervous system health.   - Please ensure you schedule with cardiology ASAP as we discussed, the staff will be reaching out to schedule with you.  It is important.  Any questions? Keep being awesome!!  Thank you for allowing me to participate in your care.  I appreciate you. Kindest regards, Artavius Stearns

## 2022-04-03 ENCOUNTER — Telehealth: Payer: Self-pay

## 2022-04-03 NOTE — Telephone Encounter (Signed)
Pt called asking what dose and how often to take Vitamin B 12. According to lab result notes: take 1000 micrograms daily. Pt verbalized understanding.

## 2022-04-06 ENCOUNTER — Ambulatory Visit
Admission: RE | Admit: 2022-04-06 | Discharge: 2022-04-06 | Disposition: A | Discharge status: Home / Self Care | Payer: Medicaid Other | Source: Ambulatory Visit | Attending: Nurse Practitioner | Admitting: Nurse Practitioner

## 2022-04-06 ENCOUNTER — Other Ambulatory Visit: Payer: Self-pay | Admitting: Nurse Practitioner

## 2022-04-06 DIAGNOSIS — E785 Hyperlipidemia, unspecified: Secondary | ICD-10-CM | POA: Diagnosis not present

## 2022-04-06 DIAGNOSIS — R42 Dizziness and giddiness: Secondary | ICD-10-CM | POA: Insufficient documentation

## 2022-04-06 DIAGNOSIS — R0989 Other specified symptoms and signs involving the circulatory and respiratory systems: Secondary | ICD-10-CM | POA: Insufficient documentation

## 2022-04-06 DIAGNOSIS — I6523 Occlusion and stenosis of bilateral carotid arteries: Secondary | ICD-10-CM | POA: Diagnosis not present

## 2022-04-06 NOTE — Progress Notes (Signed)
Good afternoon, please contact [place] and alert him that imaging has returned.  Left side carotid is overall stable, but right side, which I was concerned about, is noticing moderate plaque build up and some narrowing.  With your recent dizziness episodes I will be placing a referral to vascular to further assess this with goal of stroke prevention.  Continue your Rosuvastatin daily.  Any questions? Keep being amazing!!  Thank you for allowing me to participate in your care.  I appreciate you. Kindest regards, Emitt Maglione

## 2022-04-06 NOTE — Progress Notes (Signed)
Referral to vascular placed due to findings on doppler carotids

## 2022-04-20 ENCOUNTER — Ambulatory Visit: Payer: Medicaid Other | Admitting: Nurse Practitioner

## 2022-04-23 ENCOUNTER — Ambulatory Visit: Payer: Medicaid Other | Attending: Cardiology | Admitting: Cardiovascular Disease

## 2022-04-23 ENCOUNTER — Encounter: Payer: Self-pay | Admitting: Cardiovascular Disease

## 2022-04-23 VITALS — BP 120/72 | HR 65 | Ht 72.0 in | Wt 199.5 lb

## 2022-04-23 DIAGNOSIS — R9431 Abnormal electrocardiogram [ECG] [EKG]: Secondary | ICD-10-CM

## 2022-04-23 DIAGNOSIS — R0609 Other forms of dyspnea: Secondary | ICD-10-CM

## 2022-04-23 DIAGNOSIS — I779 Disorder of arteries and arterioles, unspecified: Secondary | ICD-10-CM | POA: Diagnosis not present

## 2022-04-23 DIAGNOSIS — E785 Hyperlipidemia, unspecified: Secondary | ICD-10-CM | POA: Diagnosis not present

## 2022-04-23 NOTE — Patient Instructions (Signed)
Medication Instructions:  No changes *If you need a refill on your cardiac medications before your next appointment, please call your pharmacy*   Lab Work: None ordered If you have labs (blood work) drawn today and your tests are completely normal, you will receive your results only by: [place] (if you have MyChart) OR A paper copy in the mail If you have any lab test that is abnormal or we need to change your treatment, we will call you to review the results.   Testing/Procedures: Your physician has requested that you have an echocardiogram. Echocardiography is a painless test that uses sound waves to create images of your heart. It provides your doctor with information about the size and shape of your heart and how well your heart's chambers and valves are working.   You may receive an ultrasound enhancing agent through an IV if needed to better visualize your heart during the echo. This procedure takes approximately one hour.  There are no restrictions for this procedure.  This will take place at [facility] [id number], [place]  Your provider has ordered a [place] test. This will take place at [facility]. Please report to the [facility] medical mall entrance. The volunteers at the first desk will direct you where to go.  [place]  Your provider has ordered a Stress Test with nuclear imaging. The purpose of this test is to evaluate the blood supply to your heart muscle. This procedure is referred to as a "Non-Invasive Stress Test." This is because other than having an IV started in your vein, nothing is inserted or "invades" your body. Cardiac stress tests are done to find areas of poor blood flow to the heart by determining the extent of coronary artery disease (CAD). Some patients exercise on a treadmill, which naturally increases the blood flow to your heart, while others who are unable to walk on a treadmill due to physical limitations  will have a pharmacologic/chemical stress agent called Lexiscan . This medicine will mimic walking on a treadmill by temporarily increasing your coronary blood flow.   Please note: these test may take anywhere between 2-4 hours to complete  How to prepare for your Myoview test:  Nothing to eat for 6 hours prior to the test No caffeine for 24 hours prior to test No smoking 24 hours prior to test. Your medication may be taken with water.  If your doctor stopped a medication because of this test, do not take that medication. Ladies, please do not wear dresses.  Skirts or pants are appropriate. Please wear a short sleeve shirt. No perfume, cologne or lotion. Wear comfortable walking shoes. No heels!   PLEASE NOTIFY THE OFFICE AT LEAST 67 HOURS IN ADVANCE IF YOU ARE UNABLE TO KEEP YOUR APPOINTMENT.  [phone] AND  PLEASE NOTIFY NUCLEAR MEDICINE AT [facility] AT LEAST 24 HOURS IN ADVANCE IF YOU ARE UNABLE TO KEEP YOUR APPOINTMENT. [phone]    Follow-Up: At [facility], you and your health needs are our priority.  As part of our continuing mission to provide you with exceptional heart care, we have created designated Provider Care Teams.  These Care Teams include your primary Cardiologist (physician) and Advanced Practice Providers (APPs -  Physician Assistants and Nurse Practitioners) who all work together to provide you with the care you need, when you need it.  We recommend signing up for the patient portal called "MyChart".  Sign up information is provided on this After  Visit Summary.  MyChart is used to connect with patients for Virtual Visits (Telemedicine).  Patients are able to view lab/test results, encounter notes, upcoming appointments, etc.  Non-urgent messages can be sent to your provider as well.   To learn more about what you can do with MyChart, go to [email or web address].    Your next appointment:   Follow up after testing

## 2022-04-23 NOTE — Progress Notes (Signed)
Cardiology Office Note   Date:  04/23/2022   ID:  Charles Lamb, DOB 06-15-1960, MRN [id number]  PCP:  Venita Lick, NP  Cardiologist:   Kathlyn Sacramento, MD   Chief Complaint  Patient presents with   New Patient (Initial Visit)    Ref by Marnee Guarneri, NP abnormal EKG. Patient c/o chest pain/squeezing/pressure that comes and goes, head pressure and shortness of breath. Medications reviewed by the patient verbally.       History of Present Illness: Charles Lamb is a 62 y.o. male who was referred by Marnee Guarneri for evaluation of an abnormal EKG. She has past medical history of hyperlipidemia, tobacco use and recently diagnosed carotid disease.  He had a recent EKG that showed evidence of anterior and anterolateral ST elevation.  However, I reviewed his EKGs over the last 10 years and his EKGs has always been this abnormal.  He is not aware of history of hypertrophic cardiomyopathy.  His symptoms include exertional dyspnea with no orthopnea or PND.  No chest pain.  He denies palpitations or syncope.  He reports no family history of coronary artery disease, sudden death or arrhythmia.  He is retired from [employer] systems [employer].   Past Medical History:  Diagnosis Date   Hypertension    Renal disorder     Past Surgical History:  Procedure Laterality Date   COLONOSCOPY WITH PROPOFOL N/A 02/07/2019   Procedure: COLONOSCOPY WITH PROPOFOL;  Lamb: Jonathon Bellows, MD;  Location: [facility] ENDOSCOPY;  Service: Gastroenterology;  Laterality: N/A;   EYE SURGERY Left    FLEXIBLE SIGMOIDOSCOPY N/A 04/30/2020   Procedure: FLEXIBLE SIGMOIDOSCOPY;  Lamb: Jonathon Bellows, MD;  Location: [facility] ENDOSCOPY;  Service: Gastroenterology;  Laterality: N/A;   TOTAL SHOULDER ARTHROPLASTY Left 05/25/2018   Procedure: LEFT SHOULDER HEMIARTHROPLASTY, BICEPS TENODESIS;  Lamb: Lovell Sheehan, MD;  Location: [facility] ORS;  Service: Orthopedics;  Laterality: Left;     Current Outpatient Medications   Medication Sig Dispense Refill   aspirin EC 81 MG tablet Take 1 tablet (81 mg total) by mouth daily. Swallow whole. 90 tablet 4   diclofenac Sodium (VOLTAREN) 1 % GEL Apply 2 g topically 4 (four) times daily. 2 g 1   hydrocortisone (ANUSOL-HC) 2.5 % rectal cream Place 1 application rectally 2 (two) times daily. 28 g 0   meloxicam (MOBIC) 15 MG tablet Take 15 mg by mouth daily.     rosuvastatin (CRESTOR) 40 MG tablet Take 1 tablet (40 mg total) by mouth daily. 90 tablet 3   No current facility-administered medications for this visit.    Allergies:   Patient has no known allergies.    Social History:  The patient  reports that he has been smoking cigarettes. He has a 11.25 pack-year smoking history. He has never used smokeless tobacco. He reports that he does not currently use alcohol. He reports that he does not currently use drugs.   Family History:  The patient's family history includes Kidney disease in his brother, brother, and mother.    ROS:  Please see the history of present illness.   Otherwise, review of systems are positive for none.   All other systems are reviewed and negative.    PHYSICAL EXAM: VS:  BP 120/72 (BP Location: Right Arm, Patient Position: Sitting, Cuff Size: Normal)   Pulse 65   Ht 6' (1.829 m)   Wt 199 lb 8 oz (90.5 kg)   SpO2 98%   BMI 27.06 kg/m  ,  BMI Body mass index is 27.06 kg/m. GEN: Well nourished, well developed, in no acute distress  HEENT: normal  Neck: no JVD or masses.  Bilateral carotid bruits louder on the right side Cardiac: RRR; no  rubs, or gallops,no edema .  1 out of 6 systolic murmur at the left sternal border Respiratory:  clear to auscultation bilaterally, normal work of breathing GI: soft, nontender, nondistended, + BS MS: no deformity or atrophy  Skin: warm and dry, no rash Neuro:  Strength and sensation are intact Psych: euthymic mood, full affect   EKG:  EKG is ordered today. The ekg ordered today demonstrates sinus  rhythm with anterior and anterolateral ST elevation.   Recent Labs: 03/31/2022: ALT 25; BUN 11; Creatinine, Ser 1.09; Hemoglobin 14.5; Platelets 203; Potassium 4.4; Sodium 135; TSH 2.290    Lipid Panel    Component Value Date/Time   CHOL 192 03/31/2022 1104   TRIG 159 (H) 03/31/2022 1104   HDL 70 03/31/2022 1104   CHOLHDL 2.7 09/10/2021 1027   LDLCALC 95 03/31/2022 1104      Wt Readings from Last 3 Encounters:  04/23/22 199 lb 8 oz (90.5 kg)  03/31/22 194 lb 4.8 oz (88.1 kg)  01/20/22 193 lb 3.2 oz (87.6 kg)         04/23/2022    9:09 AM  PAD Screen  Previous PAD dx? No  Previous surgical procedure? No  Pain with walking? No  Feet/toe relief with dangling? No  Painful, non-healing ulcers? No  Extremities discolored? No      ASSESSMENT AND PLAN:  1.  Highly abnormal EKG: This is likely suggestive of hypertrophic cardiomyopathy.  I requested an echocardiogram for evaluation. His symptoms include exertional dyspnea without chest pain.  We have to exclude underlying ischemic heart disease considering his risk factors.  I requested a Lexiscan Myoview.  2.  Hyperlipidemia: I agree with increasing the dose of rosuvastatin to get his LDL below 70 given recent diagnosis of carotid disease.  3.  Moderate right carotid stenosis: This is asymptomatic and does not require revascularization at this point.  Velocity in the ICA was 125/34.  Recommend repeat carotid Doppler in 1 year and treatment of risk factors.  Continue low-dose aspirin.  4.  Tobacco use: Smoking cessation is advised.   Disposition:   FU after cardiac testing.  Signed,  Kathlyn Sacramento, MD  04/23/2022 9:42 AM    [facility]

## 2022-04-26 DIAGNOSIS — I6521 Occlusion and stenosis of right carotid artery: Secondary | ICD-10-CM | POA: Insufficient documentation

## 2022-04-26 NOTE — Patient Instructions (Incomplete)
Dizziness Dizziness is a common problem. It makes you feel unsteady or light-headed. You may feel like you are about to pass out (faint). Dizziness can lead to getting hurt if you stumble or fall. Dizziness can be caused by many things, including: Medicines. Not having enough water in your body (dehydration). Illness. Follow these instructions at home: Eating and drinking  Drink enough fluid to keep your pee (urine) pale yellow. This helps to keep you from getting dehydrated. Try to drink more clear fluids, such as water. Do not drink alcohol. Limit how much caffeine you drink or eat, if your doctor tells you to do that. Limit how much salt (sodium) you drink or eat, if your doctor tells you to do that. Activity  Avoid making quick movements. Stand up slowly from sitting in a chair, and steady yourself until you feel okay. In the morning, first sit up on the side of the bed. When you feel okay, stand up slowly while you hold onto something. Do this until you know that your balance is okay. If you need to stand in one place for a long time, move your legs often. Tighten and relax the muscles in your legs while you are standing. Do not drive or use machinery if you feel dizzy. Avoid bending down if you feel dizzy. Place items in your home so you can reach them easily without leaning over. Lifestyle Do not smoke or use any products that contain nicotine or tobacco. If you need help quitting, ask your doctor. Try to lower your stress level. You can do this by using methods such as yoga or meditation. Talk with your doctor if you need help. General instructions Watch your dizziness for any changes. Take over-the-counter and prescription medicines only as told by your doctor. Talk with your doctor if you think that you are dizzy because of a medicine that you are taking. Tell a friend or a family member that you are feeling dizzy. If he or she notices any changes in your behavior, have this  person call your doctor. Keep all follow-up visits. Contact a doctor if: Your dizziness does not go away. Your dizziness or light-headedness gets worse. You feel like you may vomit (are nauseous). You have trouble hearing. You have new symptoms. You are unsteady on your feet. You feel like the room is spinning. You have neck pain or a stiff neck. You have a fever. Get help right away if: You vomit or have watery poop (diarrhea), and you cannot eat or drink anything. You have trouble: Talking. Walking. Swallowing. Using your arms, hands, or legs. You feel generally weak. You are not thinking clearly, or you have trouble forming sentences. A friend or family member may notice this. You have: Chest pain. Pain in your belly (abdomen). Shortness of breath. Sweating. Your vision changes. You are bleeding. You have a very bad headache. These symptoms may be an emergency. Get help right away. Call your local emergency services (911 in the [place] Do not wait to see if the symptoms will go away. Do not drive yourself to the hospital. Summary Dizziness makes you feel unsteady or light-headed. You may feel like you are about to pass out (faint). Drink enough fluid to keep your pee (urine) pale yellow. Do not drink alcohol. Avoid making quick movements if you feel dizzy. Watch your dizziness for any changes. This information is not intended to replace advice given to you by your health care provider. Make sure you discuss any questions   you have with your health care provider. Document Revised: 02/05/2020 Document Reviewed: 02/05/2020 Elsevier Patient Education  2023 Elsevier Inc.  

## 2022-04-28 ENCOUNTER — Ambulatory Visit: Payer: Medicaid Other | Admitting: Nurse Practitioner

## 2022-04-29 ENCOUNTER — Ambulatory Visit: Payer: Medicaid Other | Admitting: Cardiology

## 2022-04-29 ENCOUNTER — Encounter: Admission: RE | Admit: 2022-04-29 | Payer: Medicaid Other | Source: Ambulatory Visit

## 2022-04-30 ENCOUNTER — Ambulatory Visit: Payer: Medicaid Other | Admitting: Nurse Practitioner

## 2022-05-04 ENCOUNTER — Encounter
Admission: RE | Admit: 2022-05-04 | Discharge: 2022-05-04 | Disposition: A | Discharge status: Home / Self Care | Payer: Medicaid Other | Source: Ambulatory Visit | Attending: Cardiovascular Disease | Admitting: Cardiovascular Disease

## 2022-05-04 DIAGNOSIS — R0609 Other forms of dyspnea: Secondary | ICD-10-CM | POA: Insufficient documentation

## 2022-05-04 LAB — NM MYOCAR MULTI W/SPECT W/WALL MOTION / EF
Base ST Elevation (mm): 2 mm
LV dias vol: 77 mL (ref 62–150)
LV sys vol: 33 mL
Nuc Stress EF: 57 %
Peak HR: 89 {beats}/min
Percent HR: 55 %
Rest HR: 61 {beats}/min
Rest Nuclear Isotope Dose: 10.7 mCi
SDS: 0
SRS: 0
SSS: 0
Stress Nuclear Isotope Dose: 31.7 mCi
TID: 0.84

## 2022-05-04 MED ORDER — TECHNETIUM TC 99M TETROFOSMIN IV KIT
31.6900 | PACK | Freq: Once | INTRAVENOUS | Status: AC | PRN
  Administered 2022-05-04: 31.69 via INTRAVENOUS

## 2022-05-04 MED ORDER — REGADENOSON 0.4 MG/5ML IV SOLN
0.4000 mg | Freq: Once | INTRAVENOUS | Status: AC
  Administered 2022-05-04: 0.4 mg via INTRAVENOUS

## 2022-05-04 MED ORDER — TECHNETIUM TC 99M TETROFOSMIN IV KIT
10.0000 | PACK | Freq: Once | INTRAVENOUS | Status: AC | PRN
  Administered 2022-05-04: 10.66 via INTRAVENOUS

## 2022-05-29 ENCOUNTER — Ambulatory Visit: Payer: Medicaid Other | Admitting: Internal Medicine

## 2022-06-01 ENCOUNTER — Encounter: Payer: Self-pay | Admitting: Internal Medicine

## 2022-06-01 ENCOUNTER — Ambulatory Visit (INDEPENDENT_AMBULATORY_CARE_PROVIDER_SITE_OTHER): Payer: Medicaid Other | Admitting: Internal Medicine

## 2022-06-01 VITALS — BP 132/76 | HR 86 | Temp 98.2°F | Ht 73.0 in | Wt 198.6 lb

## 2022-06-01 DIAGNOSIS — M47816 Spondylosis without myelopathy or radiculopathy, lumbar region: Secondary | ICD-10-CM | POA: Diagnosis not present

## 2022-06-01 DIAGNOSIS — E782 Mixed hyperlipidemia: Secondary | ICD-10-CM

## 2022-06-01 NOTE — Progress Notes (Signed)
Established Patient Office Visit  Subjective:  Patient ID: Charles Lamb, male    DOB: 1960-07-02  Age: 62 y.o. MRN: [id number]  Chief Complaint  Patient presents with   Jay patient    New patient, transferring care from [facility]. PMH of hyperlipidemia and arthritis. Denies any complaints today.    No other concerns at this time.   Past Medical History:  Diagnosis Date   Arthritis    Hemorrhoids    Hyperlipidemia     Past Surgical History:  Procedure Laterality Date   TOTAL SHOULDER REPLACEMENT Left     Social History   Socioeconomic History   Marital status: Single    Spouse name: Not on file   Number of children: 0   Years of education: Not on file   Highest education level: High school graduate  Occupational History   Occupation: disability    Comment: Former Market researcher  Tobacco Use   Smoking status: Every Day    Packs/day: .25    Types: Cigarettes   Smokeless tobacco: Never  Vaping Use   Vaping Use: Never used  Substance and Sexual Activity   Alcohol use: Yes    Alcohol/week: 2.0 standard drinks of alcohol    Types: 2 Cans of beer per week   Drug use: Never   Sexual activity: Not on file  Other Topics Concern   Not on file  Social History Narrative   Not on file   Social Determinants of Health   Financial Resource Strain: Not on file  Food Insecurity: Not on file  Transportation Needs: Not on file  Physical Activity: Not on file  Stress: Not on file  Social Connections: Not on file  Intimate Partner Violence: Not on file    Family History  Problem Relation Age of Onset   Kidney disease Mother    Kidney disease Brother     No Known Allergies  Review of Systems  Constitutional: Negative.   HENT: Negative.    Eyes:        Left eye visual loss  Respiratory: Negative.    Cardiovascular: Negative.   Gastrointestinal: Negative.   Genitourinary: Negative.   Musculoskeletal:  Positive for back pain.  Skin: Negative.    Neurological: Negative.   Endo/Heme/Allergies: Negative.        Objective:   BP 132/76   Pulse 86   Temp 98.2 F (36.8 C) (Oral)   Ht 6\' 1"  (A999333 m)   Wt 198 lb 9.6 oz (90.1 kg)   SpO2 97%   BMI 26.20 kg/m   Vitals:   06/01/22 1256  BP: 132/76  Pulse: 86  Temp: 98.2 F (36.8 C)  Height: 6\' 1"  (1.854 m)  Weight: 198 lb 9.6 oz (90.1 kg)  SpO2: 97%  TempSrc: Oral  BMI (Calculated): 26.21    Physical Exam Vitals reviewed.  Constitutional:      Appearance: Normal appearance.  HENT:     Head: Normocephalic.     Left Ear: There is no impacted cerumen.     Nose: Nose normal.     Mouth/Throat:     Mouth: Mucous membranes are moist.     Pharynx: No posterior oropharyngeal erythema.  Eyes:     General: Lids are normal. Visual field deficit present.     Extraocular Movements: Extraocular movements intact.     Pupils: Pupils are equal, round, and reactive to light.     Comments: Opaque left cornea  Cardiovascular:  Rate and Rhythm: Regular rhythm.     Chest Wall: PMI is not displaced.     Pulses: Normal pulses.     Heart sounds: Normal heart sounds. No murmur heard. Pulmonary:     Effort: Pulmonary effort is normal.     Breath sounds: Normal air entry. No rhonchi or rales.  Abdominal:     General: Abdomen is flat. Bowel sounds are normal. There is no distension.     Palpations: Abdomen is soft. There is no hepatomegaly, splenomegaly or mass.     Tenderness: There is no abdominal tenderness.  Musculoskeletal:        General: Normal range of motion.     Cervical back: Normal range of motion and neck supple.     Right lower leg: No edema.     Left lower leg: No edema.  Skin:    General: Skin is warm and dry.  Neurological:     General: No focal deficit present.     Mental Status: He is alert and oriented to person, place, and time.     Cranial Nerves: No cranial nerve deficit.     Motor: No weakness.     Deep Tendon Reflexes: Reflexes abnormal.      Comments: Hyporeflexic globally  Psychiatric:        Mood and Affect: Mood normal.        Behavior: Behavior normal.      No results found for any visits on 06/01/22.  No results found for this or any previous visit (from the past 2160 hour(Ariyana Faw)).    Assessment & Plan:   Problem List Items Addressed This Visit       Musculoskeletal and Integument   Arthritis of lumbar spine   Relevant Medications   meloxicam (MOBIC) 15 MG tablet   aspirin EC 81 MG tablet   Other Relevant Orders   CBC With Diff/Platelet     Other   Mixed hyperlipidemia - Primary   Relevant Medications   rosuvastatin (CRESTOR) 40 MG tablet   aspirin EC 81 MG tablet   Other Relevant Orders   Comprehensive metabolic panel   Lipid panel    Return in about 2 months (around 08/01/2022) for fu with labs prior.   Total time spent: 30 minutes  Volanda Napoleon, MD  06/01/2022

## 2022-06-04 ENCOUNTER — Ambulatory Visit: Payer: Medicaid Other | Attending: Cardiovascular Disease

## 2022-06-04 ENCOUNTER — Encounter: Payer: Self-pay | Admitting: Internal Medicine

## 2022-06-04 DIAGNOSIS — R9431 Abnormal electrocardiogram [ECG] [EKG]: Secondary | ICD-10-CM | POA: Diagnosis not present

## 2022-06-04 LAB — ECHOCARDIOGRAM COMPLETE
AR max vel: 2.83 cm2
AV Area VTI: 3.1 cm2
AV Area mean vel: 3.22 cm2
AV Mean grad: 3 mmHg
AV Peak grad: 7 mmHg
Ao pk vel: 1.32 m/s
Area-P 1/2: 3.72 cm2
S' Lateral: 1.9 cm

## 2022-06-08 NOTE — Progress Notes (Unsigned)
Cardiology Office Note    Date:  06/09/2022   ID:  Charles Lamb, DOB [birth year], MRN [id number]  PCP:  Jodi Marble, MD  Cardiologist:  Kathlyn Sacramento, MD  Electrophysiologist:  None   Chief Complaint: Follow up  History of Present Illness:   Charles Lamb is a 62 y.o. male with history of coronary artery calcification noted on CT imaging, abnormal EKG, carotid artery disease, HLD, and tobacco use who presents for follow-up of Lexiscan MPI and echo.  Carotid artery ultrasound in 03/2022 showed 50 to 69% right ICA stenosis with minimal left ICA stenosis.  In this setting, PCP referred him to vascular surgery.  He was evaluated by Dr. Fletcher Anon as inpatient in 04/2022 at the request of his PCP for longstanding history of abnormal EKGs with anterior lateral ST elevation dating back to approximately 10 years ago.  At that time, he reported exertional dyspnea without frank chest pain, palpitations, syncope, or symptoms of cardiac decompensation.  He noted no family history of CAD, sudden death, or arrhythmia.  His EKG was felt to be suggestive of hypertrophic cardiomyopathy.  Lexiscan MPI in 04/2022 showed no evidence of ischemia or infarction and was overall low risk.  CT attenuation corrected images showed moderate coronary artery calcification.  Echo in 05/2022 demonstrated an EF of 60 to 65%, no regional wall motion abnormalities, moderate LVH, grade 1 diastolic dysfunction, normal RV systolic function and ventricular cavity size, mild mitral regurgitation, aortic valve sclerosis without evidence of stenosis, and an estimated right atrial pressure of 3 mmHg.  He comes in doing well from a cardiac perspective and no symptoms of angina or cardiac decompensation.  No dyspnea, dizziness, presyncope, or syncope.  No lower extremity swelling, abdominal distention, progressive orthopnea, or early satiety.  He denies any prior history of hypertension and is not currently on antihypertensive therapy.   No family history of heart failure, significant CAD, or sudden death.  Overall, he feels well and does not have any acute concerns at this time.   Labs independently reviewed: 03/2022 - TSH normal, TC 192, TG 159, HDL 70, LDL 95, BUN 11, serum creatinine 1.09, potassium 4.4, albumin 4.6, AST/ALT normal, Hgb 14.5, PLT 203  Past Medical History:  Diagnosis Date   Arthritis    Hemorrhoids    Hyperlipidemia    Hypertension    Renal disorder     Past Surgical History:  Procedure Laterality Date   COLONOSCOPY WITH PROPOFOL N/A 02/07/2019   Procedure: COLONOSCOPY WITH PROPOFOL;  Surgeon: Jonathon Bellows, MD;  Location: [facility] ENDOSCOPY;  Service: Gastroenterology;  Laterality: N/A;   EYE SURGERY Left    FLEXIBLE SIGMOIDOSCOPY N/A 04/30/2020   Procedure: FLEXIBLE SIGMOIDOSCOPY;  Surgeon: Jonathon Bellows, MD;  Location: [facility] ENDOSCOPY;  Service: Gastroenterology;  Laterality: N/A;   TOTAL SHOULDER ARTHROPLASTY Left 05/25/2018   Procedure: LEFT SHOULDER HEMIARTHROPLASTY, BICEPS TENODESIS;  Surgeon: Lovell Sheehan, MD;  Location: [facility] ORS;  Service: Orthopedics;  Laterality: Left;   TOTAL SHOULDER REPLACEMENT Left     Current Medications: Current Meds  Medication Sig   aspirin EC 81 MG tablet Take 1 tablet (81 mg total) by mouth daily. Swallow whole.   carvedilol (COREG) 3.125 MG tablet Take 1 tablet (3.125 mg total) by mouth 2 (two) times daily.   Cholecalciferol (VITAMIN D3) 250 MCG (10000 UT) CAPS Take 1 capsule by mouth daily.   diclofenac Sodium (VOLTAREN) 1 % GEL Apply 2 g topically 4 (four) times daily.   hydrocortisone (ANUSOL-HC) 2.5 %  rectal cream Place 1 application rectally 2 (two) times daily.   meloxicam (MOBIC) 15 MG tablet Take 15 mg by mouth daily.   rosuvastatin (CRESTOR) 40 MG tablet Take 1 tablet (40 mg total) by mouth daily.    Allergies:   Patient has no known allergies.   Social History   Socioeconomic History   Marital status: Single    Spouse name: Not on file    Number of children: 0   Years of education: Not on file   Highest education level: High school graduate  Occupational History   Occupation: disability    Comment: Former Market researcher  Tobacco Use   Smoking status: Every Day    Packs/day: 0.25    Years: 45.00    Additional pack years: 0.00    Total pack years: 11.25    Types: Cigarettes   Smokeless tobacco: Never  Vaping Use   Vaping Use: Never used  Substance and Sexual Activity   Alcohol use: Yes    Alcohol/week: 2.0 standard drinks of alcohol    Types: 2 Cans of beer per week    Comment: 1 40oz a day   Drug use: Never   Sexual activity: Not on file  Other Topics Concern   Not on file  Social History Narrative   ** Merged History Encounter **       Social Determinants of Health   Financial Resource Strain: Medium Risk (12/18/2021)   Overall Financial Resource Strain (CARDIA)    Difficulty of Paying Living Expenses: Somewhat hard  Food Insecurity: No Food Insecurity (12/18/2021)   Hunger Vital Sign    Worried About Running Out of Food in the Last Year: Never true    Ran Out of Food in the Last Year: Never true  Transportation Needs: No Transportation Needs (12/18/2021)   PRAPARE - Hydrologist (Medical): No    Lack of Transportation (Non-Medical): No  Physical Activity: Inactive (01/12/2018)   Exercise Vital Sign    Days of Exercise per Week: 0 days    Minutes of Exercise per Session: 0 min  Stress: Not on file  Social Connections: Unknown (01/12/2018)   Social Connection and Isolation Panel [NHANES]    Frequency of Communication with Friends and Family: More than three times a week    Frequency of Social Gatherings with Friends and Family: More than three times a week    Attends Religious Services: Not asked    Active Member of Clubs or Organizations: Not on file    Attends Archivist Meetings: Not on file    Marital Status: Not on file     Family History:  The patient's family  history includes Kidney disease in his brother, brother, brother, and mother.  ROS:   12-point review of systems is negative unless otherwise noted in the HPI.   EKGs/Labs/Other Studies Reviewed:    Studies reviewed were summarized above. The additional studies were reviewed today:  2D echo 06/04/2022: 1. Left ventricular ejection fraction, by estimation, is 60 to 65%. The  left ventricle has normal function. The left ventricle has no regional  wall motion abnormalities. There is moderate left ventricular hypertrophy.  Left ventricular diastolic  parameters are consistent with Grade I diastolic dysfunction (impaired  relaxation). The average left ventricular global longitudinal strain is  -17.7 %.   2. Right ventricular systolic function is normal. The right ventricular  size is normal.   3. The mitral valve is normal in structure. Mild  mitral valve  regurgitation. No evidence of mitral stenosis.   4. The aortic valve is normal in structure. There is mild calcification  of the aortic valve. Aortic valve regurgitation is not visualized. Aortic  valve sclerosis is present, with no evidence of aortic valve stenosis.   5. The inferior vena cava is normal in size with greater than 50%  respiratory variability, suggesting right atrial pressure of 3 mmHg.  __________  Carlton Adam MPI 05/04/2022:   The study is normal. The study is low risk.   ST elevation (V2, V3 and V4) was noted. this is chronic.   LV perfusion is normal. There is no evidence of ischemia. There is no evidence of infarction.   Left ventricular function is normal. End diastolic cavity size is normal. End systolic cavity size is normal.   CT attenuation images showed moderate coronary calcification. __________  Carotid artery ultrasound 04/06/2022: IMPRESSION: 1. Moderate amount of right-sided atherosclerotic plaque results in elevated peak systolic velocities within the right internal carotid artery compatible with the  50-69% luminal narrowing range. Further evaluation with CTA could be performed as indicated. 2. Minimal amount of left-sided atherosclerotic plaque, not resulting in a hemodynamically significant stenosis.   EKG:  EKG is not ordered today.    Recent Labs: 03/31/2022: ALT 25; BUN 11; Creatinine, Ser 1.09; Hemoglobin 14.5; Platelets 203; Potassium 4.4; Sodium 135; TSH 2.290  Recent Lipid Panel    Component Value Date/Time   CHOL 192 03/31/2022 1104   TRIG 159 (H) 03/31/2022 1104   HDL 70 03/31/2022 1104   CHOLHDL 2.7 09/10/2021 1027   LDLCALC 95 03/31/2022 1104    PHYSICAL EXAM:    VS:  BP 130/78 (BP Location: Left Arm, Patient Position: Sitting, Cuff Size: Large)   Pulse 79   Ht 6\' 1"  (1.854 m)   Wt 196 lb 9.6 oz (89.2 kg)   SpO2 98%   BMI 25.94 kg/m   BMI: Body mass index is 25.94 kg/m.  Physical Exam Constitutional:      Appearance: He is well-developed.  HENT:     Head: Normocephalic and atraumatic.  Eyes:     General:        Right eye: No discharge.        Left eye: No discharge.  Neck:     Vascular: No JVD.  Cardiovascular:     Rate and Rhythm: Normal rate and regular rhythm.     Pulses:          Carotid pulses are  on the right side with bruit and  on the left side with bruit.      Posterior tibial pulses are 2+ on the right side and 2+ on the left side.     Heart sounds: Normal heart sounds, S1 normal and S2 normal. Heart sounds not distant. No midsystolic click and no opening snap. No murmur heard.    No friction rub.     Comments: Right-sided carotid bruit louder than the left. Pulmonary:     Effort: Pulmonary effort is normal. No respiratory distress.     Breath sounds: Normal breath sounds. No decreased breath sounds, wheezing or rales.  Chest:     Chest wall: No tenderness.  Abdominal:     General: There is no distension.  Musculoskeletal:     Cervical back: Normal range of motion.     Right lower leg: No edema.     Left lower leg: No edema.   Skin:    General: Skin is  warm and dry.     Nails: There is no clubbing.  Neurological:     Mental Status: He is alert and oriented to person, place, and time.  Psychiatric:        Speech: Speech normal.        Behavior: Behavior normal.        Thought Content: Thought content normal.        Judgment: Judgment normal.     Wt Readings from Last 3 Encounters:  06/09/22 196 lb 9.6 oz (89.2 kg)  06/01/22 198 lb 9.6 oz (90.1 kg)  04/23/22 199 lb 8 oz (90.5 kg)     ASSESSMENT & PLAN:   Abnormal EKG/LVH: He has a history of abnormal EKG with anterolateral ST elevation that dates back approximately 10 years.  Recent Lexiscan MPI showed no evidence of infarction or ischemia.  Echo earlier this month showed preserved LV systolic function with normal wall motion and moderate LVH without evidence of hypertrophic cardiomyopathy.  He denies a history of hypertension.  No known family history of cardiomyopathy, significant CAD, or sudden death.  We did discuss the role of cardiac MRI for further evaluation of his LVH.  We have elected to initiate carvedilol 3.125 mg twice daily and see him back in a couple of months.  Low threshold to pursue cardiac MRI at that time.  Coronary artery calcification/HLD: He is without symptoms of angina or cardiac decompensation.  LDL 95 in 03/2022 with normal AST/ALT at that time.  Goal LDL less than 70.  Now on atorvastatin 40 mg daily.  Indicates PCP will be obtaining fasting lipid panel next month.  Carotid artery disease: Recent carotid artery ultrasound in 03/2022 showed moderate right ICA stenosis.  He has been referred to vascular surgery by prior PCP.  He remains on aspirin and atorvastatin.  He will need follow-up carotid Doppler in 03/2023.  Tobacco use: Complete cessation is advised.   Disposition: F/u with Dr. Fletcher Anon or an APP in 2-3 months.   Medication Adjustments/Labs and Tests Ordered: Current medicines are reviewed at length with the patient today.   Concerns regarding medicines are outlined above. Medication changes, Labs and Tests ordered today are summarized above and listed in the Patient Instructions accessible in Encounters.   SignedChristell Faith, PA-C 06/09/2022 4:09 PM     [place] [address] [place] [address] [place], [place] [address] [phone]

## 2022-06-09 ENCOUNTER — Ambulatory Visit: Payer: Medicaid Other | Attending: Physician Assistant | Admitting: Physician Assistant

## 2022-06-09 ENCOUNTER — Encounter (INDEPENDENT_AMBULATORY_CARE_PROVIDER_SITE_OTHER): Payer: Medicaid Other | Admitting: Vascular Surgery

## 2022-06-09 ENCOUNTER — Encounter: Payer: Self-pay | Admitting: Physician Assistant

## 2022-06-09 VITALS — BP 130/78 | HR 79 | Ht 73.0 in | Wt 196.6 lb

## 2022-06-09 DIAGNOSIS — Z720 Tobacco use: Secondary | ICD-10-CM | POA: Diagnosis not present

## 2022-06-09 DIAGNOSIS — I2510 Atherosclerotic heart disease of native coronary artery without angina pectoris: Secondary | ICD-10-CM | POA: Diagnosis not present

## 2022-06-09 DIAGNOSIS — E785 Hyperlipidemia, unspecified: Secondary | ICD-10-CM

## 2022-06-09 DIAGNOSIS — I2584 Coronary atherosclerosis due to calcified coronary lesion: Secondary | ICD-10-CM

## 2022-06-09 DIAGNOSIS — I779 Disorder of arteries and arterioles, unspecified: Secondary | ICD-10-CM

## 2022-06-09 DIAGNOSIS — R9431 Abnormal electrocardiogram [ECG] [EKG]: Secondary | ICD-10-CM

## 2022-06-09 DIAGNOSIS — I517 Cardiomegaly: Secondary | ICD-10-CM | POA: Diagnosis not present

## 2022-06-09 MED ORDER — CARVEDILOL 3.125 MG PO TABS: 3.1250 mg | ORAL_TABLET | Freq: Two times a day (BID) | ORAL | 0 refills | Status: DC

## 2022-06-09 NOTE — Patient Instructions (Signed)
Medication Instructions:    Your physician has recommended you make the following change in your medication:    START Carvedilol 3.125 mg tablet by mouth twice daily.  *If you need a refill on your cardiac medications before your next appointment, please call your pharmacy*   Lab Work:  NONE  If you have labs (blood work) drawn today and your tests are completely normal, you will receive your results only by: [place] (if you have MyChart) OR A paper copy in the mail If you have any lab test that is abnormal or we need to change your treatment, we will call you to review the results.   Testing/Procedures: NONE    Follow-Up: At [facility], you and your health needs are our priority.  As part of our continuing mission to provide you with exceptional heart care, we have created designated Provider Care Teams.  These Care Teams include your primary Cardiologist (physician) and Advanced Practice Providers (APPs -  Physician Assistants and Nurse Practitioners) who all work together to provide you with the care you need, when you need it.  We recommend signing up for the patient portal called "MyChart".  Sign up information is provided on this After Visit Summary.  MyChart is used to connect with patients for Virtual Visits (Telemedicine).  Patients are able to view lab/test results, encounter notes, upcoming appointments, etc.  Non-urgent messages can be sent to your provider as well.   To learn more about what you can do with MyChart, go to [email or web address].    Your next appointment:   3 month(s)  Provider:   Christell Faith, PA-C

## 2022-07-14 ENCOUNTER — Other Ambulatory Visit (INDEPENDENT_AMBULATORY_CARE_PROVIDER_SITE_OTHER): Payer: Self-pay | Admitting: Vascular Surgery

## 2022-07-14 DIAGNOSIS — I6523 Occlusion and stenosis of bilateral carotid arteries: Secondary | ICD-10-CM

## 2022-07-17 ENCOUNTER — Encounter (INDEPENDENT_AMBULATORY_CARE_PROVIDER_SITE_OTHER): Payer: Medicaid Other

## 2022-07-17 ENCOUNTER — Ambulatory Visit (INDEPENDENT_AMBULATORY_CARE_PROVIDER_SITE_OTHER): Payer: Medicaid Other

## 2022-07-17 ENCOUNTER — Encounter (INDEPENDENT_AMBULATORY_CARE_PROVIDER_SITE_OTHER): Payer: Medicaid Other | Admitting: Vascular Surgery

## 2022-07-17 ENCOUNTER — Ambulatory Visit (INDEPENDENT_AMBULATORY_CARE_PROVIDER_SITE_OTHER): Payer: Medicaid Other | Admitting: Vascular Surgery

## 2022-07-17 VITALS — BP 119/79 | HR 72 | Resp 17 | Ht 73.0 in | Wt 194.0 lb

## 2022-07-17 DIAGNOSIS — I6523 Occlusion and stenosis of bilateral carotid arteries: Secondary | ICD-10-CM

## 2022-07-17 DIAGNOSIS — E782 Mixed hyperlipidemia: Secondary | ICD-10-CM | POA: Diagnosis not present

## 2022-07-17 DIAGNOSIS — I6521 Occlusion and stenosis of right carotid artery: Secondary | ICD-10-CM | POA: Diagnosis not present

## 2022-07-17 DIAGNOSIS — I10 Essential (primary) hypertension: Secondary | ICD-10-CM

## 2022-07-17 NOTE — Assessment & Plan Note (Signed)
blood pressure control important in reducing the progression of atherosclerotic disease. On appropriate oral medications.  

## 2022-07-17 NOTE — Assessment & Plan Note (Signed)
Carotid duplex today reveals velocities in the upper end of the 40 to 59% range on the right side and in the 1 to 39% range on the left side.  Continue aspirin and statin agent.  No plan for intervention at this time.  Recheck in 6 months.

## 2022-07-17 NOTE — Progress Notes (Signed)
Patient ID: Charles Lamb, male   DOB: 06/13/1960, 62 y.o.   MRN: [id number]  Chief Complaint  Patient presents with   Establish Care    HPI Charles Lamb is a 62 y.o. male.  I am asked to see the patient by Aura Dials for evaluation of carotid stenosis.  The patient reports no focal symptoms of cerebrovascular ischemia.  Specifically, he has not had arm or leg weakness or numbness, speech or swallowing difficulty, or temporary monocular blindness.  He had a previously performed carotid duplex several months ago by his primary care physician which demonstrated 50 to 69% stenosis by their velocity criteria on the right and less than 50% carotid stenosis on the left.  Given these findings, he is referred for further evaluation and treatment.   Past Medical History:  Diagnosis Date   Arthritis    Hemorrhoids    Hyperlipidemia    Hypertension    Renal disorder     Past Surgical History:  Procedure Laterality Date   COLONOSCOPY WITH PROPOFOL N/A 02/07/2019   Procedure: COLONOSCOPY WITH PROPOFOL;  Surgeon: Wyline Mood, MD;  Location: [facility] ENDOSCOPY;  Service: Gastroenterology;  Laterality: N/A;   EYE SURGERY Left    FLEXIBLE SIGMOIDOSCOPY N/A 04/30/2020   Procedure: FLEXIBLE SIGMOIDOSCOPY;  Surgeon: Wyline Mood, MD;  Location: [facility] ENDOSCOPY;  Service: Gastroenterology;  Laterality: N/A;   TOTAL SHOULDER ARTHROPLASTY Left 05/25/2018   Procedure: LEFT SHOULDER HEMIARTHROPLASTY, BICEPS TENODESIS;  Surgeon: Lyndle Herrlich, MD;  Location: [facility] ORS;  Service: Orthopedics;  Laterality: Left;   TOTAL SHOULDER REPLACEMENT Left     Family History  Problem Relation Age of Onset   Kidney disease Mother    Kidney disease Brother    Kidney disease Brother    Kidney disease Brother   No bleeding or clotting disorders   Social History   Tobacco Use   Smoking status: Every Day    Packs/day: 0.25    Years: 45.00    Additional pack years: 0.00    Total pack years: 11.25     Types: Cigarettes   Smokeless tobacco: Never  Vaping Use   Vaping Use: Never used  Substance Use Topics   Alcohol use: Yes    Alcohol/week: 2.0 standard drinks of alcohol    Types: 2 Cans of beer per week    Comment: 1 40oz a day   Drug use: Never     No Known Allergies  Current Outpatient Medications  Medication Sig Dispense Refill   aspirin EC 81 MG tablet Take 1 tablet (81 mg total) by mouth daily. Swallow whole. 90 tablet 4   carvedilol (COREG) 3.125 MG tablet Take 1 tablet (3.125 mg total) by mouth 2 (two) times daily. 180 tablet 0   Cholecalciferol (VITAMIN D3) 250 MCG (10000 UT) CAPS Take 1 capsule by mouth daily.     diclofenac Sodium (VOLTAREN) 1 % GEL Apply 2 g topically 4 (four) times daily. 2 g 1   hydrocortisone (ANUSOL-HC) 2.5 % rectal cream Place 1 application rectally 2 (two) times daily. 28 g 0   rosuvastatin (CRESTOR) 40 MG tablet Take 1 tablet (40 mg total) by mouth daily. 90 tablet 3   No current facility-administered medications for this visit.      REVIEW OF SYSTEMS (Negative unless checked)  Constitutional: [] Weight loss  [] Fever  [] Chills Cardiac: [] Chest pain   [] Chest pressure   [] Palpitations   [] Shortness of breath when laying flat   [] Shortness of breath at  rest   [] Shortness of breath with exertion. Vascular:  [] Pain in legs with walking   [] Pain in legs at rest   [] Pain in legs when laying flat   [] Claudication   [] Pain in feet when walking  [] Pain in feet at rest  [] Pain in feet when laying flat   [] History of DVT   [] Phlebitis   [] Swelling in legs   [] Varicose veins   [] Non-healing ulcers Pulmonary:   [] Uses home oxygen   [] Productive cough   [] Hemoptysis   [] Wheeze  [] COPD   [] Asthma Neurologic:  [] Dizziness  [] Blackouts   [] Seizures   [] History of stroke   [] History of TIA  [] Aphasia   [] Temporary blindness   [] Dysphagia   [] Weakness or numbness in arms   [] Weakness or numbness in legs Musculoskeletal:  [x] Arthritis   [] Joint swelling   [] Joint  pain   [] Low back pain Hematologic:  [] Easy bruising  [] Easy bleeding   [] Hypercoagulable state   [] Anemic  [] Hepatitis Gastrointestinal:  [] Blood in stool   [] Vomiting blood  [] Gastroesophageal reflux/heartburn   [] Abdominal pain Genitourinary:  [x] Chronic kidney disease   [] Difficult urination  [] Frequent urination  [] Burning with urination   [] Hematuria Skin:  [] Rashes   [] Ulcers   [] Wounds Psychological:  [] History of anxiety   []  History of major depression.    Physical Exam BP 119/79 (BP Location: Left Arm)   Pulse 72   Resp 17   Ht 6\' 1"  (1.854 m)   Wt 194 lb (88 kg)   BMI 25.60 kg/m  Gen:  WD/WN, NAD Head: [place]/AT, No temporalis wasting. Ear/Nose/Throat: Hearing grossly intact, nares w/o erythema or drainage, oropharynx w/o Erythema/Exudate Eyes: left eye foggy with diminished vision. Neck: trachea midline.  Right carotid bruit Pulmonary:  Good air movement, clear to auscultation bilaterally.  Cardiac: RRR, no JVD Vascular:  Vessel Right Left  Radial Palpable Palpable           Musculoskeletal: M/S 5/5 throughout.  Extremities without ischemic changes.  No deformity or atrophy. No edema. Neurologic: Sensation grossly intact in extremities.  Symmetrical.  Speech is fluent. Motor exam as listed above. Psychiatric: Judgment intact, Mood & affect appropriate for pt's clinical situation. Dermatologic: No rashes or ulcers noted.  No cellulitis or open wounds. Lymph : No Cervical, Axillary, or Inguinal lymphadenopathy.   Radiology No results found.  Labs Recent Results (from the past 2160 hour(s))  NM Myocar Multi W/Spect W/Wall Motion / EF     Status: None   Collection Time: 05/04/22 11:21 AM  Result Value Ref Range   Rest HR 61.0 bpm   Rest BP 135/71 mmHg   Peak HR 89 bpm   Peak BP 141/71 mmHg   Percent HR 55.0 %   Rest Nuclear Isotope Dose 10.7 mCi   Stress Nuclear Isotope Dose 31.7 mCi   SSS 0.0    SRS 0.0    SDS 0.0    TID 0.84    LV sys vol 33.0 mL   LV  dias vol 77.0 62 - 150 mL   Nuc Stress EF 57 %   Base ST Elevation (mm) 2.0 mm  ECHOCARDIOGRAM COMPLETE     Status: None   Collection Time: 06/04/22 12:33 PM  Result Value Ref Range   AR max vel 2.83 cm2   AV Peak grad 7.0 mmHg   Ao pk vel 1.32 m/s   S' Lateral 1.90 cm   Area-P 1/2 3.72 cm2   AV Area VTI 3.10 cm2  AV Mean grad 3.0 mmHg   AV Area mean vel 3.22 cm2   Est EF 60 - 65%     Assessment/Plan:  Carotid stenosis, asymptomatic, right Carotid duplex today reveals velocities in the upper end of the 40 to 59% range on the right side and in the 1 to 39% range on the left side.  Continue aspirin and statin agent.  No plan for intervention at this time.  Recheck in 6 months.  Hypertension blood pressure control important in reducing the progression of atherosclerotic disease. On appropriate oral medications.   Mixed hyperlipidemia lipid control important in reducing the progression of atherosclerotic disease. Continue statin therapy      Festus Barren 07/17/2022, 3:14 PM   This note was created with Dragon medical transcription system.  Any errors from dictation are unintentional.

## 2022-07-17 NOTE — Assessment & Plan Note (Signed)
lipid control important in reducing the progression of atherosclerotic disease. Continue statin therapy  

## 2022-07-29 ENCOUNTER — Other Ambulatory Visit: Payer: Medicaid Other

## 2022-07-29 DIAGNOSIS — R7301 Impaired fasting glucose: Secondary | ICD-10-CM

## 2022-07-29 DIAGNOSIS — E782 Mixed hyperlipidemia: Secondary | ICD-10-CM

## 2022-07-29 DIAGNOSIS — E7800 Pure hypercholesterolemia, unspecified: Secondary | ICD-10-CM

## 2022-07-29 DIAGNOSIS — I10 Essential (primary) hypertension: Secondary | ICD-10-CM

## 2022-07-30 ENCOUNTER — Other Ambulatory Visit: Payer: Medicaid Other

## 2022-07-30 DIAGNOSIS — E782 Mixed hyperlipidemia: Secondary | ICD-10-CM | POA: Diagnosis not present

## 2022-07-30 DIAGNOSIS — I10 Essential (primary) hypertension: Secondary | ICD-10-CM | POA: Diagnosis not present

## 2022-07-30 DIAGNOSIS — E7800 Pure hypercholesterolemia, unspecified: Secondary | ICD-10-CM | POA: Diagnosis not present

## 2022-07-30 DIAGNOSIS — R7301 Impaired fasting glucose: Secondary | ICD-10-CM | POA: Diagnosis not present

## 2022-07-31 LAB — CMP14+EGFR
ALT: 25 IU/L (ref 0–44)
AST: 24 IU/L (ref 0–40)
Albumin/Globulin Ratio: 1.5 (ref 1.2–2.2)
Albumin: 4.3 g/dL (ref 3.8–4.9)
Alkaline Phosphatase: 61 IU/L (ref 44–121)
BUN/Creatinine Ratio: 9 — ABNORMAL LOW (ref 10–24)
BUN: 9 mg/dL (ref 8–27)
Bilirubin Total: 0.3 mg/dL (ref 0.0–1.2)
CO2: 21 mmol/L (ref 20–29)
Calcium: 9.5 mg/dL (ref 8.6–10.2)
Chloride: 103 mmol/L (ref 96–106)
Creatinine, Ser: 1 mg/dL (ref 0.76–1.27)
Globulin, Total: 2.8 g/dL (ref 1.5–4.5)
Glucose: 92 mg/dL (ref 70–99)
Potassium: 4.1 mmol/L (ref 3.5–5.2)
Sodium: 140 mmol/L (ref 134–144)
Total Protein: 7.1 g/dL (ref 6.0–8.5)
eGFR: 86 mL/min/{1.73_m2} (ref >59)

## 2022-07-31 LAB — HEMOGLOBIN A1C
Est. average glucose Bld gHb Est-mCnc: 123 mg/dL
Hgb A1c MFr Bld: 5.9 % — ABNORMAL HIGH (ref 4.8–5.6)

## 2022-07-31 LAB — CBC WITH DIFFERENTIAL
Basophils Absolute: 0 10*3/uL (ref 0.0–0.2)
Basos: 1 %
EOS (ABSOLUTE): 0.1 10*3/uL (ref 0.0–0.4)
Eos: 2 %
Hematocrit: 40.7 % (ref 37.5–51.0)
Hemoglobin: 13.5 g/dL (ref 13.0–17.7)
Immature Grans (Abs): 0 10*3/uL (ref 0.0–0.1)
Immature Granulocytes: 0 %
Lymphocytes Absolute: 2.1 10*3/uL (ref 0.7–3.1)
Lymphs: 33 %
MCH: 30.4 pg (ref 26.6–33.0)
MCHC: 33.2 g/dL (ref 31.5–35.7)
MCV: 92 fL (ref 79–97)
Monocytes Absolute: 0.6 10*3/uL (ref 0.1–0.9)
Monocytes: 9 %
Neutrophils Absolute: 3.5 10*3/uL (ref 1.4–7.0)
Neutrophils: 55 %
RBC: 4.44 x10E6/uL (ref 4.14–5.80)
RDW: 13.1 % (ref 11.6–15.4)
WBC: 6.4 10*3/uL (ref 3.4–10.8)

## 2022-07-31 LAB — LIPID PANEL
Chol/HDL Ratio: 3.1 ratio (ref 0.0–5.0)
Cholesterol, Total: 167 mg/dL (ref 100–199)
HDL: 54 mg/dL (ref >39)
LDL Chol Calc (NIH): 89 mg/dL (ref 0–99)
Triglycerides: 140 mg/dL (ref 0–149)
VLDL Cholesterol Cal: 24 mg/dL (ref 5–40)

## 2022-07-31 LAB — TSH: TSH: 1.52 u[IU]/mL (ref 0.450–4.500)

## 2022-08-03 ENCOUNTER — Ambulatory Visit (INDEPENDENT_AMBULATORY_CARE_PROVIDER_SITE_OTHER): Payer: Medicaid Other | Admitting: Internal Medicine

## 2022-08-03 ENCOUNTER — Encounter: Payer: Self-pay | Admitting: Internal Medicine

## 2022-08-03 VITALS — BP 128/86 | HR 74 | Ht 73.0 in | Wt 198.0 lb

## 2022-08-03 DIAGNOSIS — E7800 Pure hypercholesterolemia, unspecified: Secondary | ICD-10-CM

## 2022-08-03 DIAGNOSIS — I10 Essential (primary) hypertension: Secondary | ICD-10-CM | POA: Diagnosis not present

## 2022-08-03 DIAGNOSIS — R7303 Prediabetes: Secondary | ICD-10-CM

## 2022-08-03 NOTE — Progress Notes (Signed)
Established Patient Office Visit  Subjective:  Patient ID: Charles Lamb, male    DOB: Oct 12, 1960  Age: 62 y.o. MRN: [id number]  Chief Complaint  Patient presents with   Follow-up    2 month follow up, discuss lab results.    No new complaints, here for lab review and medication refills. Labs reviewed and notable for A1c in the prediabetic range, LDL well controlled and unremarkable CMP and CBC.    No other concerns at this time.   Past Medical History:  Diagnosis Date   Arthritis    Hemorrhoids    Hyperlipidemia    Hypertension    Renal disorder     Past Surgical History:  Procedure Laterality Date   COLONOSCOPY WITH PROPOFOL N/A 02/07/2019   Procedure: COLONOSCOPY WITH PROPOFOL;  Surgeon: Wyline Mood, MD;  Location: [facility] ENDOSCOPY;  Service: Gastroenterology;  Laterality: N/A;   EYE SURGERY Left    FLEXIBLE SIGMOIDOSCOPY N/A 04/30/2020   Procedure: FLEXIBLE SIGMOIDOSCOPY;  Surgeon: Wyline Mood, MD;  Location: [facility] ENDOSCOPY;  Service: Gastroenterology;  Laterality: N/A;   TOTAL SHOULDER ARTHROPLASTY Left 05/25/2018   Procedure: LEFT SHOULDER HEMIARTHROPLASTY, BICEPS TENODESIS;  Surgeon: Lyndle Herrlich, MD;  Location: [facility] ORS;  Service: Orthopedics;  Laterality: Left;   TOTAL SHOULDER REPLACEMENT Left     Social History   Socioeconomic History   Marital status: Single    Spouse name: Not on file   Number of children: 0   Years of education: Not on file   Highest education level: High school graduate  Occupational History   Occupation: disability    Comment: Former Hospital doctor  Tobacco Use   Smoking status: Every Day    Packs/day: 0.25    Years: 45.00    Additional pack years: 0.00    Total pack years: 11.25    Types: Cigarettes   Smokeless tobacco: Never  Vaping Use   Vaping Use: Never used  Substance and Sexual Activity   Alcohol use: Yes    Alcohol/week: 2.0 standard drinks of alcohol    Types: 2 Cans of beer per week    Comment: 1 40oz a day   Drug  use: Never   Sexual activity: Not on file  Other Topics Concern   Not on file  Social History Narrative   ** Merged History Encounter **       Social Determinants of Health   Financial Resource Strain: Medium Risk (12/18/2021)   Overall Financial Resource Strain (CARDIA)    Difficulty of Paying Living Expenses: Somewhat hard  Food Insecurity: No Food Insecurity (12/18/2021)   Hunger Vital Sign    Worried About Running Out of Food in the Last Year: Never true    Ran Out of Food in the Last Year: Never true  Transportation Needs: No Transportation Needs (12/18/2021)   PRAPARE - Administrator, Civil Service (Medical): No    Lack of Transportation (Non-Medical): No  Physical Activity: Inactive (01/12/2018)   Exercise Vital Sign    Days of Exercise per Week: 0 days    Minutes of Exercise per Session: 0 min  Stress: Not on file  Social Connections: Unknown (01/12/2018)   Social Connection and Isolation Panel [NHANES]    Frequency of Communication with Friends and Family: More than three times a week    Frequency of Social Gatherings with Friends and Family: More than three times a week    Attends Religious Services: Not asked    Active Member of Clubs  or Organizations: Not on file    Attends Banker Meetings: Not on file    Marital Status: Not on file  Intimate Partner Violence: Not on file    Family History  Problem Relation Age of Onset   Kidney disease Mother    Kidney disease Brother    Kidney disease Brother    Kidney disease Brother     No Known Allergies  Review of Systems  Constitutional: Negative.   HENT: Negative.    Eyes:        Left eye visual loss  Respiratory: Negative.    Cardiovascular: Negative.   Gastrointestinal: Negative.   Genitourinary: Negative.   Musculoskeletal:  Positive for back pain.  Skin: Negative.   Neurological: Negative.   Endo/Heme/Allergies: Negative.        Objective:   BP 128/86   Pulse 74   Ht 6'  1" (1.854 m)   Wt 198 lb (89.8 kg)   SpO2 97%   BMI 26.12 kg/m   Vitals:   08/03/22 1415  BP: 128/86  Pulse: 74  Height: 6\' 1"  (1.854 m)  Weight: 198 lb (89.8 kg)  SpO2: 97%  BMI (Calculated): 26.13    Physical Exam Vitals reviewed.  Constitutional:      Appearance: Normal appearance.  HENT:     Head: Normocephalic.     Left Ear: There is no impacted cerumen.     Nose: Nose normal.     Mouth/Throat:     Mouth: Mucous membranes are moist.     Pharynx: No posterior oropharyngeal erythema.  Eyes:     General: Lids are normal. Visual field deficit present.     Extraocular Movements: Extraocular movements intact.     Pupils: Pupils are equal, round, and reactive to light.     Comments: Opaque left cornea  Cardiovascular:     Rate and Rhythm: Regular rhythm.     Chest Wall: PMI is not displaced.     Pulses: Normal pulses.     Heart sounds: Normal heart sounds. No murmur heard. Pulmonary:     Effort: Pulmonary effort is normal.     Breath sounds: Normal air entry. No rhonchi or rales.  Abdominal:     General: Abdomen is flat. Bowel sounds are normal. There is no distension.     Palpations: Abdomen is soft. There is no hepatomegaly, splenomegaly or mass.     Tenderness: There is no abdominal tenderness.  Musculoskeletal:        General: Normal range of motion.     Cervical back: Normal range of motion and neck supple.     Right lower leg: No edema.     Left lower leg: No edema.  Skin:    General: Skin is warm and dry.  Neurological:     General: No focal deficit present.     Mental Status: He is alert and oriented to person, place, and time.     Cranial Nerves: No cranial nerve deficit.     Motor: No weakness.     Deep Tendon Reflexes: Reflexes abnormal.     Comments: Hyporeflexic globally  Psychiatric:        Mood and Affect: Mood normal.        Behavior: Behavior normal.      No results found for any visits on 08/03/22.  Recent Results (from the past 2160  hour(Gabriela Giannelli))  ECHOCARDIOGRAM COMPLETE     Status: None   Collection Time: 06/04/22 12:33 PM  Result Value Ref Range   AR max vel 2.83 cm2   AV Peak grad 7.0 mmHg   Ao pk vel 1.32 m/Abbygale Lapid   Marsean Elkhatib' Lateral 1.90 cm   Area-P 1/2 3.72 cm2   AV Area VTI 3.10 cm2   AV Mean grad 3.0 mmHg   AV Area mean vel 3.22 cm2   Est EF 60 - 65%   Hemoglobin A1c     Status: Abnormal   Collection Time: 07/30/22  8:57 AM  Result Value Ref Range   Hgb A1c MFr Bld 5.9 (H) 4.8 - 5.6 %    Comment:          Prediabetes: 5.7 - 6.4          Diabetes: >6.4          Glycemic control for adults with diabetes: <7.0    Est. average glucose Bld gHb Est-mCnc 123 mg/dL  TSH     Status: None   Collection Time: 07/30/22  8:57 AM  Result Value Ref Range   TSH 1.520 0.450 - 4.500 uIU/mL  CMP14+EGFR     Status: Abnormal   Collection Time: 07/30/22  8:57 AM  Result Value Ref Range   Glucose 92 70 - 99 mg/dL   BUN 9 8 - 27 mg/dL   Creatinine, Ser 4.09 0.76 - 1.27 mg/dL   eGFR 86 >81 XB/JYN/8.29   BUN/Creatinine Ratio 9 (L) 10 - 24   Sodium 140 134 - 144 mmol/L   Potassium 4.1 3.5 - 5.2 mmol/L   Chloride 103 96 - 106 mmol/L   CO2 21 20 - 29 mmol/L   Calcium 9.5 8.6 - 10.2 mg/dL   Total Protein 7.1 6.0 - 8.5 g/dL   Albumin 4.3 3.8 - 4.9 g/dL   Globulin, Total 2.8 1.5 - 4.5 g/dL   Albumin/Globulin Ratio 1.5 1.2 - 2.2   Bilirubin Total 0.3 0.0 - 1.2 mg/dL   Alkaline Phosphatase 61 44 - 121 IU/L   AST 24 0 - 40 IU/L   ALT 25 0 - 44 IU/L  Lipid panel     Status: None   Collection Time: 07/30/22  8:57 AM  Result Value Ref Range   Cholesterol, Total 167 100 - 199 mg/dL   Triglycerides 562 0 - 149 mg/dL   HDL 54 >13 mg/dL   VLDL Cholesterol Cal 24 5 - 40 mg/dL   LDL Chol Calc (NIH) 89 0 - 99 mg/dL   Chol/HDL Ratio 3.1 0.0 - 5.0 ratio    Comment:                                   T. Chol/HDL Ratio                                             Men  Women                               1/2 Avg.Risk  3.4    3.3                                    Avg.Risk  5.0    4.4  2X Avg.Risk  9.6    7.1                                3X Avg.Risk 23.4   11.0   CBC With Differential     Status: None   Collection Time: 07/30/22  8:57 AM  Result Value Ref Range   WBC 6.4 3.4 - 10.8 x10E3/uL   RBC 4.44 4.14 - 5.80 x10E6/uL   Hemoglobin 13.5 13.0 - 17.7 g/dL   Hematocrit 40.9 81.1 - 51.0 %   MCV 92 79 - 97 fL   MCH 30.4 26.6 - 33.0 pg   MCHC 33.2 31.5 - 35.7 g/dL   RDW 91.4 78.2 - 95.6 %   Neutrophils 55 Not Estab. %   Lymphs 33 Not Estab. %   Monocytes 9 Not Estab. %   Eos 2 Not Estab. %   Basos 1 Not Estab. %   Neutrophils Absolute 3.5 1.4 - 7.0 x10E3/uL   Lymphocytes Absolute 2.1 0.7 - 3.1 x10E3/uL   Monocytes Absolute 0.6 0.1 - 0.9 x10E3/uL   EOS (ABSOLUTE) 0.1 0.0 - 0.4 x10E3/uL   Basophils Absolute 0.0 0.0 - 0.2 x10E3/uL   Immature Granulocytes 0 Not Estab. %   Immature Grans (Abs) 0.0 0.0 - 0.1 x10E3/uL      Assessment & Plan:   Problem List Items Addressed This Visit   None   No follow-ups on file.   Total time spent: 20 minutes  Luna Fuse, MD  08/03/2022   This document may have been prepared by [facility] Voice Recognition software and as such may include unintentional dictation errors.

## 2022-08-24 ENCOUNTER — Other Ambulatory Visit: Payer: Self-pay | Admitting: Physician Assistant

## 2022-09-08 NOTE — Progress Notes (Signed)
Cardiology Office Note    Date:  09/10/2022   ID:  [name], DOB 03-19-1960, MRN [id number]  PCP:  Sherron Monday, MD  Cardiologist:  Lorine Bears, MD  Electrophysiologist:  None   Chief Complaint: Follow-up  History of Present Illness:   Charles Lamb is a 62 y.o. male with history of coronary artery calcification noted on CT imaging, abnormal EKG with LVH, carotid artery disease, HLD, and tobacco use who presents for follow-up of coronary artery calcification.   Carotid artery ultrasound in 03/2022 showed 50 to 69% right ICA stenosis with minimal left ICA stenosis.  In this setting, PCP referred him to vascular surgery with recommendation to recheck vascular imaging in the fall 2024 through their office.   He was evaluated by Dr. Kirke Corin as inpatient in 04/2022 at the request of his PCP for longstanding history of abnormal EKGs with anterior lateral ST elevation dating back to approximately 10 years ago.  At that time, he reported exertional dyspnea without frank chest pain, palpitations, syncope, or symptoms of cardiac decompensation.  He noted no family history of CAD, sudden death, or arrhythmia.  His EKG was felt to be suggestive of hypertrophic cardiomyopathy.  Lexiscan MPI in 04/2022 showed no evidence of ischemia or infarction and was overall low risk.  CT attenuation corrected images showed moderate coronary artery calcification.  Echo in 05/2022 demonstrated an EF of 60 to 65%, no regional wall motion abnormalities, moderate LVH, grade 1 diastolic dysfunction, normal RV systolic function and ventricular cavity size, mild mitral regurgitation, aortic valve sclerosis without evidence of stenosis, and an estimated right atrial pressure of 3 mmHg.  He was last seen in the office in 05/2022 and was without symptoms of angina or cardiac decompensation.  Cardiac MRI was discussed.  He was started on low-dose carvedilol.  He comes in is doing well from a cardiac perspective, without  symptoms of angina or cardiac decompensation.  No dyspnea, presyncope, or syncope.  He does note a couple episodes of dizziness with quick positional changes.  No lower extremity swelling, abdominal distention, progressive orthopnea, or early satiety.  Overall feels like he is doing well and does not have any cardiac concerns at this time.   Labs independently reviewed: 07/2022 - Hgb 13.5, TC 167, TG 140, HDL 54, LDL 89, BUN 9, serum creatinine 1.00, potassium 4.1, albumin 4.3, AST/ALT normal, TSH normal, A1c 5.9 03/2022 - PLT 203   Past Medical History:  Diagnosis Date   Arthritis    Hemorrhoids    Hyperlipidemia    Hypertension    Renal disorder     Past Surgical History:  Procedure Laterality Date   COLONOSCOPY WITH PROPOFOL N/A 02/07/2019   Procedure: COLONOSCOPY WITH PROPOFOL;  Surgeon: Wyline Mood, MD;  Location: [facility] ENDOSCOPY;  Service: Gastroenterology;  Laterality: N/A;   EYE SURGERY Left    FLEXIBLE SIGMOIDOSCOPY N/A 04/30/2020   Procedure: FLEXIBLE SIGMOIDOSCOPY;  Surgeon: Wyline Mood, MD;  Location: [facility] ENDOSCOPY;  Service: Gastroenterology;  Laterality: N/A;   TOTAL SHOULDER ARTHROPLASTY Left 05/25/2018   Procedure: LEFT SHOULDER HEMIARTHROPLASTY, BICEPS TENODESIS;  Surgeon: Lyndle Herrlich, MD;  Location: [facility] ORS;  Service: Orthopedics;  Laterality: Left;   TOTAL SHOULDER REPLACEMENT Left     Current Medications: Current Meds  Medication Sig   aspirin EC 81 MG tablet Take 1 tablet (81 mg total) by mouth daily. Swallow whole.   carvedilol (COREG) 3.125 MG tablet Take 1 tablet by mouth twice daily   Cholecalciferol (  VITAMIN D3) 250 MCG (10000 UT) CAPS Take 1 capsule by mouth daily.   rosuvastatin (CRESTOR) 40 MG tablet Take 1 tablet (40 mg total) by mouth daily.    Allergies:   Patient has no known allergies.   Social History   Socioeconomic History   Marital status: Single    Spouse name: Not on file   Number of children: 0   Years of education: Not on file    Highest education level: High school graduate  Occupational History   Occupation: disability    Comment: Former Hospital doctor  Tobacco Use   Smoking status: Every Day    Packs/day: 0.25    Years: 45.00    Additional pack years: 0.00    Total pack years: 11.25    Types: Cigarettes   Smokeless tobacco: Never  Vaping Use   Vaping Use: Never used  Substance and Sexual Activity   Alcohol use: Yes    Alcohol/week: 2.0 standard drinks of alcohol    Types: 2 Cans of beer per week    Comment: 1 40oz a day   Drug use: Never   Sexual activity: Not on file  Other Topics Concern   Not on file  Social History Narrative   ** Merged History Encounter **       Social Determinants of Health   Financial Resource Strain: Medium Risk (12/18/2021)   Overall Financial Resource Strain (CARDIA)    Difficulty of Paying Living Expenses: Somewhat hard  Food Insecurity: No Food Insecurity (12/18/2021)   Hunger Vital Sign    Worried About Running Out of Food in the Last Year: Never true    Ran Out of Food in the Last Year: Never true  Transportation Needs: No Transportation Needs (12/18/2021)   PRAPARE - Administrator, Civil Service (Medical): No    Lack of Transportation (Non-Medical): No  Physical Activity: Inactive (01/12/2018)   Exercise Vital Sign    Days of Exercise per Week: 0 days    Minutes of Exercise per Session: 0 min  Stress: Not on file  Social Connections: Unknown (01/12/2018)   Social Connection and Isolation Panel [NHANES]    Frequency of Communication with Friends and Family: More than three times a week    Frequency of Social Gatherings with Friends and Family: More than three times a week    Attends Religious Services: Not asked    Active Member of Clubs or Organizations: Not on file    Attends Banker Meetings: Not on file    Marital Status: Not on file     Family History:  The patient's family history includes Kidney disease in his brother, brother,  brother, and mother.  ROS:   12-point review of systems is negative unless otherwise noted in the HPI.   EKGs/Labs/Other Studies Reviewed:    Studies reviewed were summarized above. The additional studies were reviewed today:  2D echo 06/04/2022: 1. Left ventricular ejection fraction, by estimation, is 60 to 65%. The  left ventricle has normal function. The left ventricle has no regional  wall motion abnormalities. There is moderate left ventricular hypertrophy.  Left ventricular diastolic  parameters are consistent with Grade I diastolic dysfunction (impaired  relaxation). The average left ventricular global longitudinal strain is  -17.7 %.   2. Right ventricular systolic function is normal. The right ventricular  size is normal.   3. The mitral valve is normal in structure. Mild mitral valve  regurgitation. No evidence of mitral stenosis.  4. The aortic valve is normal in structure. There is mild calcification  of the aortic valve. Aortic valve regurgitation is not visualized. Aortic  valve sclerosis is present, with no evidence of aortic valve stenosis.   5. The inferior vena cava is normal in size with greater than 50%  respiratory variability, suggesting right atrial pressure of 3 mmHg.  __________   Eugenie Birks MPI 05/04/2022:   The study is normal. The study is low risk.   ST elevation (V2, V3 and V4) was noted. this is chronic.   LV perfusion is normal. There is no evidence of ischemia. There is no evidence of infarction.   Left ventricular function is normal. End diastolic cavity size is normal. End systolic cavity size is normal.   CT attenuation images showed moderate coronary calcification. __________   Carotid artery ultrasound 04/06/2022: IMPRESSION: 1. Moderate amount of right-sided atherosclerotic plaque results in elevated peak systolic velocities within the right internal carotid artery compatible with the 50-69% luminal narrowing range. Further evaluation with  CTA could be performed as indicated. 2. Minimal amount of left-sided atherosclerotic plaque, not resulting in a hemodynamically significant stenosis.   EKG:  EKG is not ordered today.    Recent Labs: 03/31/2022: Platelets 203 07/30/2022: ALT 25; BUN 9; Creatinine, Ser 1.00; Hemoglobin 13.5; Potassium 4.1; Sodium 140; TSH 1.520  Recent Lipid Panel    Component Value Date/Time   CHOL 167 07/30/2022 0857   TRIG 140 07/30/2022 0857   HDL 54 07/30/2022 0857   CHOLHDL 3.1 07/30/2022 0857   LDLCALC 89 07/30/2022 0857    PHYSICAL EXAM:    VS:  BP 130/80 (BP Location: Right Arm, Patient Position: Sitting, Cuff Size: Normal)   Pulse 79   Ht 6' (1.829 m)   Wt 188 lb 6.4 oz (85.5 kg)   SpO2 98%   BMI 25.55 kg/m   BMI: Body mass index is 25.55 kg/m.  Physical Exam Vitals reviewed.  Constitutional:      Appearance: He is well-developed.  HENT:     Head: Normocephalic and atraumatic.  Eyes:     General:        Right eye: No discharge.        Left eye: No discharge.  Neck:     Vascular: No JVD.  Cardiovascular:     Rate and Rhythm: Normal rate and regular rhythm.     Pulses:          Carotid pulses are  on the right side with bruit and  on the left side with bruit.      Posterior tibial pulses are 2+ on the right side and 2+ on the left side.     Heart sounds: Normal heart sounds, S1 normal and S2 normal. Heart sounds not distant. No midsystolic click and no opening snap. No murmur heard.    No friction rub.  Pulmonary:     Effort: Pulmonary effort is normal. No respiratory distress.     Breath sounds: Normal breath sounds. No decreased breath sounds, wheezing or rales.  Chest:     Chest wall: No tenderness.  Abdominal:     General: There is no distension.  Musculoskeletal:     Cervical back: Normal range of motion.     Right lower leg: No edema.     Left lower leg: No edema.  Skin:    General: Skin is warm and dry.     Nails: There is no clubbing.  Neurological:  Mental Status: He is alert and oriented to person, place, and time.  Psychiatric:        Speech: Speech normal.        Behavior: Behavior normal.        Thought Content: Thought content normal.        Judgment: Judgment normal.     Wt Readings from Last 3 Encounters:  09/10/22 188 lb 6.4 oz (85.5 kg)  08/03/22 198 lb (89.8 kg)  07/17/22 194 lb (88 kg)     ASSESSMENT & PLAN:   Abnormal EKG/LVH: He has a history of abnormal EKG with anterolateral ST elevation that dates back approximately 10 years. Recent Lexiscan MPI showed no evidence of infarction or ischemia. Echo showed preserved LV systolic function with normal wall motion and moderate LVH without evidence of hypertrophic cardiomyopathy.  No known family history of cardiomyopathy, significant CAD, or sudden death.  Previously discussed role of cardiac MRI.  Remains on carvedilol 3.125 mg twice daily.  Coronary artery calcification/HLD: No symptoms suggestive of angina or cardiac decompensation.  LDL 89 in 07/2022 with normal AST/ALT at that time; obtained on Crestor 40 mg.  Goal LDL less than 70.  Has significantly improved his diet.  Look to obtain fasting lipid panel and LFT at next visit with recommendation to escalate lipid-lowering therapy as indicated.  Carotid artery disease: Carotid artery ultrasound from 07/2022 showed 40 to 59% right ICA stenosis with 1 to 39% left ICA stenosis along with antegrade flow of the bilateral vertebral arteries and normal flow hemodynamics in the bilateral subclavian arteries.  Followed by vascular surgery.  He remains on aspirin and rosuvastatin.  Tobacco use: Complete cessation is encouraged.   Disposition: F/u with Dr. Kirke Corin or an APP in 6 months.   Medication Adjustments/Labs and Tests Ordered: Current medicines are reviewed at length with the patient today.  Concerns regarding medicines are outlined above. Medication changes, Labs and Tests ordered today are summarized above and listed in the  Patient Instructions accessible in Encounters.   SignedEula Listen, PA-C 09/10/2022 11:48 AM     [facility] [place] [address] [place], [place] [address] [phone]

## 2022-09-10 ENCOUNTER — Ambulatory Visit: Payer: Medicaid Other | Attending: Physician Assistant | Admitting: Physician Assistant

## 2022-09-10 ENCOUNTER — Encounter: Payer: Self-pay | Admitting: Physician Assistant

## 2022-09-10 VITALS — BP 130/80 | HR 79 | Ht 72.0 in | Wt 188.4 lb

## 2022-09-10 DIAGNOSIS — Z720 Tobacco use: Secondary | ICD-10-CM | POA: Diagnosis not present

## 2022-09-10 DIAGNOSIS — I779 Disorder of arteries and arterioles, unspecified: Secondary | ICD-10-CM | POA: Diagnosis not present

## 2022-09-10 DIAGNOSIS — E785 Hyperlipidemia, unspecified: Secondary | ICD-10-CM

## 2022-09-10 DIAGNOSIS — R9431 Abnormal electrocardiogram [ECG] [EKG]: Secondary | ICD-10-CM

## 2022-09-10 DIAGNOSIS — I2510 Atherosclerotic heart disease of native coronary artery without angina pectoris: Secondary | ICD-10-CM

## 2022-09-10 DIAGNOSIS — I2584 Coronary atherosclerosis due to calcified coronary lesion: Secondary | ICD-10-CM | POA: Diagnosis not present

## 2022-09-10 DIAGNOSIS — I517 Cardiomegaly: Secondary | ICD-10-CM

## 2022-09-10 NOTE — Patient Instructions (Signed)
Medication Instructions:  No changes at this time.   *If you need a refill on your cardiac medications before your next appointment, please call your pharmacy*   Lab Work: None  If you have labs (blood work) drawn today and your tests are completely normal, you will receive your results only by: MyChart Message (if you have MyChart) OR A paper copy in the mail If you have any lab test that is abnormal or we need to change your treatment, we will call you to review the results.   Testing/Procedures: None   Follow-Up: At [facility], you and your health needs are our priority.  As part of our continuing mission to provide you with exceptional heart care, we have created designated Provider Care Teams.  These Care Teams include your primary Cardiologist (physician) and Advanced Practice Providers (APPs -  Physician Assistants and Nurse Practitioners) who all work together to provide you with the care you need, when you need it.  We recommend signing up for the patient portal called "MyChart".  Sign up information is provided on this After Visit Summary.  MyChart is used to connect with patients for Virtual Visits (Telemedicine).  Patients are able to view lab/test results, encounter notes, upcoming appointments, etc.  Non-urgent messages can be sent to your provider as well.   To learn more about what you can do with MyChart, go to [email or web address].    Your next appointment:   6 month(s)  Provider:   Muhammad Arida, MD or Ryan Dunn, PA-C     

## 2022-11-03 ENCOUNTER — Ambulatory Visit: Payer: Medicaid Other | Admitting: Internal Medicine

## 2022-11-03 VITALS — BP 125/75 | HR 77 | Ht 72.0 in | Wt 190.2 lb

## 2022-11-03 DIAGNOSIS — N528 Other male erectile dysfunction: Secondary | ICD-10-CM

## 2022-11-03 DIAGNOSIS — M542 Cervicalgia: Secondary | ICD-10-CM

## 2022-11-03 DIAGNOSIS — M62830 Muscle spasm of back: Secondary | ICD-10-CM | POA: Diagnosis not present

## 2022-11-03 MED ORDER — NAPROXEN 500 MG PO TABS: 500.0000 mg | ORAL_TABLET | Freq: Two times a day (BID) | ORAL | 0 refills | Status: AC

## 2022-11-03 MED ORDER — SILDENAFIL CITRATE 100 MG PO TABS: 100.0000 mg | ORAL_TABLET | ORAL | 2 refills | Status: DC | PRN

## 2022-11-03 MED ORDER — CYCLOBENZAPRINE HCL 10 MG PO TABS: 10.0000 mg | ORAL_TABLET | Freq: Three times a day (TID) | ORAL | 0 refills | Status: AC | PRN

## 2022-11-03 NOTE — Progress Notes (Signed)
Established Patient Office Visit  Subjective:  Patient ID: Charles Lamb, male    DOB: 1960-11-18  Age: 62 y.o. MRN: [id number]  Chief Complaint  Patient presents with   Follow-up    3 MO LAB F/U    No new complaints, here for lab review and medication refills. Failed to have previsit labs done.C/o ED and also c/o intermittent sharp pain behind his right mastoid area not exacerbated by movement.  No other concerns at this time.   Past Medical History:  Diagnosis Date   Arthritis    Hemorrhoids    Hyperlipidemia    Hypertension    Renal disorder     Past Surgical History:  Procedure Laterality Date   COLONOSCOPY WITH PROPOFOL N/A 02/07/2019   Procedure: COLONOSCOPY WITH PROPOFOL;  Surgeon: Wyline Mood, MD;  Location: [facility] ENDOSCOPY;  Service: Gastroenterology;  Laterality: N/A;   EYE SURGERY Left    FLEXIBLE SIGMOIDOSCOPY N/A 04/30/2020   Procedure: FLEXIBLE SIGMOIDOSCOPY;  Surgeon: Wyline Mood, MD;  Location: [facility] ENDOSCOPY;  Service: Gastroenterology;  Laterality: N/A;   TOTAL SHOULDER ARTHROPLASTY Left 05/25/2018   Procedure: LEFT SHOULDER HEMIARTHROPLASTY, BICEPS TENODESIS;  Surgeon: Lyndle Herrlich, MD;  Location: [facility] ORS;  Service: Orthopedics;  Laterality: Left;   TOTAL SHOULDER REPLACEMENT Left     Social History   Socioeconomic History   Marital status: Single    Spouse name: Not on file   Number of children: 0   Years of education: Not on file   Highest education level: High school graduate  Occupational History   Occupation: disability    Comment: Former Hospital doctor  Tobacco Use   Smoking status: Every Day    Current packs/day: 0.25    Average packs/day: 0.3 packs/day for 45.0 years (11.3 ttl pk-yrs)    Types: Cigarettes   Smokeless tobacco: Never  Vaping Use   Vaping status: Never Used  Substance and Sexual Activity   Alcohol use: Yes    Alcohol/week: 2.0 standard drinks of alcohol    Types: 2 Cans of beer per week    Comment: 1 40oz a day   Drug  use: Never   Sexual activity: Not on file  Other Topics Concern   Not on file  Social History Narrative   ** Merged History Encounter **       Social Determinants of Health   Financial Resource Strain: Medium Risk (12/18/2021)   Overall Financial Resource Strain (CARDIA)    Difficulty of Paying Living Expenses: Somewhat hard  Food Insecurity: No Food Insecurity (12/18/2021)   Hunger Vital Sign    Worried About Running Out of Food in the Last Year: Never true    Ran Out of Food in the Last Year: Never true  Transportation Needs: No Transportation Needs (12/18/2021)   PRAPARE - Administrator, Civil Service (Medical): No    Lack of Transportation (Non-Medical): No  Physical Activity: Inactive (01/12/2018)   Exercise Vital Sign    Days of Exercise per Week: 0 days    Minutes of Exercise per Session: 0 min  Stress: Not on file  Social Connections: Unknown (01/12/2018)   Social Connection and Isolation Panel [NHANES]    Frequency of Communication with Friends and Family: More than three times a week    Frequency of Social Gatherings with Friends and Family: More than three times a week    Attends Religious Services: Not asked    Active Member of Clubs or Organizations: Not on file  Attends Banker Meetings: Not on file    Marital Status: Not on file  Intimate Partner Violence: Not on file    Family History  Problem Relation Age of Onset   Kidney disease Mother    Kidney disease Brother    Kidney disease Brother    Kidney disease Brother     No Known Allergies  Review of Systems  Constitutional: Negative.   HENT: Negative.    Eyes:        Left eye visual loss  Respiratory: Negative.    Cardiovascular: Negative.   Gastrointestinal: Negative.   Genitourinary: Negative.   Musculoskeletal:  Positive for back pain and neck pain.  Skin: Negative.   Neurological: Negative.   Endo/Heme/Allergies: Negative.        Objective:   BP 125/75    Pulse 77   Ht 6' (1.829 m)   Wt 190 lb 3.2 oz (86.3 kg)   SpO2 98%   BMI 25.80 kg/m   Vitals:   11/03/22 1412  BP: 125/75  Pulse: 77  Height: 6' (1.829 m)  Weight: 190 lb 3.2 oz (86.3 kg)  SpO2: 98%  BMI (Calculated): 25.79    Physical Exam Vitals reviewed.  Constitutional:      Appearance: Normal appearance.  HENT:     Head: Normocephalic.     Left Ear: There is no impacted cerumen.     Nose: Nose normal.     Mouth/Throat:     Mouth: Mucous membranes are moist.     Pharynx: No posterior oropharyngeal erythema.  Eyes:     General: Lids are normal. Visual field deficit present.     Extraocular Movements: Extraocular movements intact.     Pupils: Pupils are equal, round, and reactive to light.     Comments: Opaque left cornea  Cardiovascular:     Rate and Rhythm: Regular rhythm.     Chest Wall: PMI is not displaced.     Pulses: Normal pulses.     Heart sounds: Normal heart sounds. No murmur heard. Pulmonary:     Effort: Pulmonary effort is normal.     Breath sounds: Normal air entry. No rhonchi or rales.  Abdominal:     General: Abdomen is flat. Bowel sounds are normal. There is no distension.     Palpations: Abdomen is soft. There is no hepatomegaly, splenomegaly or mass.     Tenderness: There is no abdominal tenderness.  Musculoskeletal:        General: Normal range of motion.     Cervical back: Normal range of motion and neck supple. Muscular tenderness (upper trap and right paracervical) present.     Right lower leg: No edema.     Left lower leg: No edema.  Skin:    General: Skin is warm and dry.  Neurological:     General: No focal deficit present.     Mental Status: He is alert and oriented to person, place, and time.     Cranial Nerves: No cranial nerve deficit.     Motor: No weakness.     Deep Tendon Reflexes: Reflexes abnormal.     Comments: Hyporeflexic globally  Psychiatric:        Mood and Affect: Mood normal.        Behavior: Behavior normal.       No results found for any visits on 11/03/22.  No results found for this or any previous visit (from the past 2160 hour(Yamaira Spinner)).    Assessment &  Plan:  As per problem list  Problem List Items Addressed This Visit       Musculoskeletal and Integument   Spasm of right trapezius muscle   Relevant Medications   cyclobenzaprine (FLEXERIL) 10 MG tablet   naproxen (NAPROSYN) 500 MG tablet     Other   Cervicalgia   Relevant Medications   cyclobenzaprine (FLEXERIL) 10 MG tablet   naproxen (NAPROSYN) 500 MG tablet   Other male erectile dysfunction - Primary   Relevant Medications   sildenafil (VIAGRA) 100 MG tablet    Return in about 2 weeks (around 11/17/2022) for fu with labs prior.   Total time spent: 20 minutes  Luna Fuse, MD  11/03/2022   This document may have been prepared by [facility] Voice Recognition software and as such may include unintentional dictation errors.

## 2022-11-04 ENCOUNTER — Other Ambulatory Visit: Payer: Self-pay

## 2022-11-04 ENCOUNTER — Other Ambulatory Visit: Payer: Medicaid Other

## 2022-11-04 DIAGNOSIS — M47816 Spondylosis without myelopathy or radiculopathy, lumbar region: Secondary | ICD-10-CM | POA: Diagnosis not present

## 2022-11-04 DIAGNOSIS — E782 Mixed hyperlipidemia: Secondary | ICD-10-CM

## 2022-11-04 DIAGNOSIS — R7303 Prediabetes: Secondary | ICD-10-CM | POA: Diagnosis not present

## 2022-11-04 DIAGNOSIS — E7800 Pure hypercholesterolemia, unspecified: Secondary | ICD-10-CM | POA: Diagnosis not present

## 2022-11-05 LAB — COMPREHENSIVE METABOLIC PANEL
ALT: 24 IU/L (ref 0–44)
AST: 23 IU/L (ref 0–40)
Albumin: 4.6 g/dL (ref 3.8–4.9)
Alkaline Phosphatase: 64 IU/L (ref 44–121)
BUN/Creatinine Ratio: 13 (ref 10–24)
BUN: 13 mg/dL (ref 8–27)
Bilirubin Total: 0.2 mg/dL (ref 0.0–1.2)
CO2: 21 mmol/L (ref 20–29)
Calcium: 9.6 mg/dL (ref 8.6–10.2)
Chloride: 105 mmol/L (ref 96–106)
Creatinine, Ser: 1.03 mg/dL (ref 0.76–1.27)
Globulin, Total: 2.6 g/dL (ref 1.5–4.5)
Glucose: 101 mg/dL — ABNORMAL HIGH (ref 70–99)
Potassium: 4.5 mmol/L (ref 3.5–5.2)
Sodium: 143 mmol/L (ref 134–144)
Total Protein: 7.2 g/dL (ref 6.0–8.5)
eGFR: 83 mL/min/{1.73_m2} (ref >59)

## 2022-11-05 LAB — CBC WITH DIFF/PLATELET
Basophils Absolute: 0.1 10*3/uL (ref 0.0–0.2)
Basos: 1 %
EOS (ABSOLUTE): 0.2 10*3/uL (ref 0.0–0.4)
Eos: 3 %
Hematocrit: 42.8 % (ref 37.5–51.0)
Hemoglobin: 14 g/dL (ref 13.0–17.7)
Immature Grans (Abs): 0 10*3/uL (ref 0.0–0.1)
Immature Granulocytes: 0 %
Lymphocytes Absolute: 2 10*3/uL (ref 0.7–3.1)
Lymphs: 34 %
MCH: 31 pg (ref 26.6–33.0)
MCHC: 32.7 g/dL (ref 31.5–35.7)
MCV: 95 fL (ref 79–97)
Monocytes Absolute: 0.6 10*3/uL (ref 0.1–0.9)
Monocytes: 10 %
Neutrophils Absolute: 2.9 10*3/uL (ref 1.4–7.0)
Neutrophils: 52 %
Platelets: 187 10*3/uL (ref 150–450)
RBC: 4.51 x10E6/uL (ref 4.14–5.80)
RDW: 13.2 % (ref 11.6–15.4)
WBC: 5.7 10*3/uL (ref 3.4–10.8)

## 2022-11-05 LAB — LIPID PANEL
Chol/HDL Ratio: 3 ratio (ref 0.0–5.0)
Cholesterol, Total: 182 mg/dL (ref 100–199)
HDL: 61 mg/dL (ref >39)
LDL Chol Calc (NIH): 104 mg/dL — ABNORMAL HIGH (ref 0–99)
Triglycerides: 96 mg/dL (ref 0–149)
VLDL Cholesterol Cal: 17 mg/dL (ref 5–40)

## 2022-11-05 LAB — HEMOGLOBIN A1C
Est. average glucose Bld gHb Est-mCnc: 128 mg/dL
Hgb A1c MFr Bld: 6.1 % — ABNORMAL HIGH (ref 4.8–5.6)

## 2022-11-05 LAB — CK: Total CK: 198 U/L (ref 41–331)

## 2022-11-17 ENCOUNTER — Ambulatory Visit: Payer: Medicaid Other | Admitting: Internal Medicine

## 2022-11-17 ENCOUNTER — Encounter: Payer: Self-pay | Admitting: Internal Medicine

## 2022-11-17 VITALS — BP 120/72 | HR 77 | Ht 72.0 in | Wt 193.6 lb

## 2022-11-17 DIAGNOSIS — E782 Mixed hyperlipidemia: Secondary | ICD-10-CM

## 2022-11-17 DIAGNOSIS — E7800 Pure hypercholesterolemia, unspecified: Secondary | ICD-10-CM | POA: Diagnosis not present

## 2022-11-17 DIAGNOSIS — R7303 Prediabetes: Secondary | ICD-10-CM | POA: Diagnosis not present

## 2022-11-17 DIAGNOSIS — K625 Hemorrhage of anus and rectum: Secondary | ICD-10-CM | POA: Diagnosis not present

## 2022-11-17 DIAGNOSIS — M62830 Muscle spasm of back: Secondary | ICD-10-CM | POA: Diagnosis not present

## 2022-11-17 MED ORDER — HYDROCORTISONE (PERIANAL) 2.5 % EX CREA: 1.0000 | TOPICAL_CREAM | Freq: Two times a day (BID) | CUTANEOUS | 0 refills | Status: DC

## 2022-11-17 NOTE — Progress Notes (Signed)
Established Patient Office Visit  Subjective:  Patient ID: Charles Lamb, male    DOB: 02-Jun-1960  Age: 62 y.o. MRN: [id number]  Chief Complaint  Patient presents with   Follow-up    2 week f/u with lab results    No new complaints, here for lab review and medication refills. Labs reviewed and notable for rise in A1c to 6.1 although still in the prediabetic range. LDL no longer at target with unremarkable cmp. Neck pain has improved and no longer using Cyclobenzaprine regularly. Denies noncompliance with medications.  No other concerns at this time.   Past Medical History:  Diagnosis Date   Arthritis    Hemorrhoids    Hyperlipidemia    Hypertension    Renal disorder     Past Surgical History:  Procedure Laterality Date   COLONOSCOPY WITH PROPOFOL N/A 02/07/2019   Procedure: COLONOSCOPY WITH PROPOFOL;  Surgeon: Wyline Mood, MD;  Location: [facility] ENDOSCOPY;  Service: Gastroenterology;  Laterality: N/A;   EYE SURGERY Left    FLEXIBLE SIGMOIDOSCOPY N/A 04/30/2020   Procedure: FLEXIBLE SIGMOIDOSCOPY;  Surgeon: Wyline Mood, MD;  Location: [facility] ENDOSCOPY;  Service: Gastroenterology;  Laterality: N/A;   TOTAL SHOULDER ARTHROPLASTY Left 05/25/2018   Procedure: LEFT SHOULDER HEMIARTHROPLASTY, BICEPS TENODESIS;  Surgeon: Lyndle Herrlich, MD;  Location: [facility] ORS;  Service: Orthopedics;  Laterality: Left;   TOTAL SHOULDER REPLACEMENT Left     Social History   Socioeconomic History   Marital status: Single    Spouse name: Not on file   Number of children: 0   Years of education: Not on file   Highest education level: High school graduate  Occupational History   Occupation: disability    Comment: Former Hospital doctor  Tobacco Use   Smoking status: Every Day    Current packs/day: 0.25    Average packs/day: 0.3 packs/day for 45.0 years (11.3 ttl pk-yrs)    Types: Cigarettes   Smokeless tobacco: Never  Vaping Use   Vaping status: Never Used  Substance and Sexual Activity   Alcohol use:  Yes    Alcohol/week: 2.0 standard drinks of alcohol    Types: 2 Cans of beer per week    Comment: 1 40oz a day   Drug use: Never   Sexual activity: Not on file  Other Topics Concern   Not on file  Social History Narrative   ** Merged History Encounter **       Social Determinants of Health   Financial Resource Strain: Medium Risk (12/18/2021)   Overall Financial Resource Strain (CARDIA)    Difficulty of Paying Living Expenses: Somewhat hard  Food Insecurity: No Food Insecurity (12/18/2021)   Hunger Vital Sign    Worried About Running Out of Food in the Last Year: Never true    Ran Out of Food in the Last Year: Never true  Transportation Needs: No Transportation Needs (12/18/2021)   PRAPARE - Administrator, Civil Service (Medical): No    Lack of Transportation (Non-Medical): No  Physical Activity: Inactive (01/12/2018)   Exercise Vital Sign    Days of Exercise per Week: 0 days    Minutes of Exercise per Session: 0 min  Stress: Not on file  Social Connections: Unknown (01/12/2018)   Social Connection and Isolation Panel [NHANES]    Frequency of Communication with Friends and Family: More than three times a week    Frequency of Social Gatherings with Friends and Family: More than three times a week  Attends Religious Services: Not asked    Active Member of Clubs or Organizations: Not on file    Attends Banker Meetings: Not on file    Marital Status: Not on file  Intimate Partner Violence: Not on file    Family History  Problem Relation Age of Onset   Kidney disease Mother    Kidney disease Brother    Kidney disease Brother    Kidney disease Brother     No Known Allergies  Review of Systems  Constitutional: Negative.   HENT: Negative.    Eyes:        Left eye visual loss  Respiratory: Negative.    Cardiovascular: Negative.   Gastrointestinal: Negative.   Genitourinary: Negative.   Musculoskeletal:  Positive for back pain and neck pain.   Skin: Negative.   Neurological: Negative.   Endo/Heme/Allergies: Negative.        Objective:   BP (!) 150/75   Pulse 77   Ht 6' (1.829 m)   Wt 193 lb 9.6 oz (87.8 kg)   SpO2 97%   BMI 26.26 kg/m   Vitals:   11/17/22 1454  BP: (!) 150/75  Pulse: 77  Height: 6' (1.829 m)  Weight: 193 lb 9.6 oz (87.8 kg)  SpO2: 97%  BMI (Calculated): 26.25    Physical Exam Vitals reviewed.  Constitutional:      Appearance: Normal appearance.  HENT:     Head: Normocephalic.     Left Ear: There is no impacted cerumen.     Nose: Nose normal.     Mouth/Throat:     Mouth: Mucous membranes are moist.     Pharynx: No posterior oropharyngeal erythema.  Eyes:     General: Lids are normal. Visual field deficit present.     Extraocular Movements: Extraocular movements intact.     Pupils: Pupils are equal, round, and reactive to light.     Comments: Opaque left cornea  Cardiovascular:     Rate and Rhythm: Regular rhythm.     Chest Wall: PMI is not displaced.     Pulses: Normal pulses.     Heart sounds: Normal heart sounds. No murmur heard. Pulmonary:     Effort: Pulmonary effort is normal.     Breath sounds: Normal air entry. No rhonchi or rales.  Abdominal:     General: Abdomen is flat. Bowel sounds are normal. There is no distension.     Palpations: Abdomen is soft. There is no hepatomegaly, splenomegaly or mass.     Tenderness: There is no abdominal tenderness.  Musculoskeletal:        General: Normal range of motion.     Cervical back: Normal range of motion and neck supple. Muscular tenderness (upper trap and right paracervical) present.     Right lower leg: No edema.     Left lower leg: No edema.  Skin:    General: Skin is warm and dry.  Neurological:     General: No focal deficit present.     Mental Status: He is alert and oriented to person, place, and time.     Cranial Nerves: No cranial nerve deficit.     Motor: No weakness.     Deep Tendon Reflexes: Reflexes abnormal.      Comments: Hyporeflexic globally  Psychiatric:        Mood and Affect: Mood normal.        Behavior: Behavior normal.      No results found for any visits  on 11/17/22.  Recent Results (from the past 2160 hour(Kolyn Rozario))  Lipid panel     Status: Abnormal   Collection Time: 11/04/22 11:54 AM  Result Value Ref Range   Cholesterol, Total 182 100 - 199 mg/dL   Triglycerides 96 0 - 149 mg/dL   HDL 61 >91 mg/dL   VLDL Cholesterol Cal 17 5 - 40 mg/dL   LDL Chol Calc (NIH) 478 (H) 0 - 99 mg/dL   Chol/HDL Ratio 3.0 0.0 - 5.0 ratio    Comment:                                   T. Chol/HDL Ratio                                             Men  Women                               1/2 Avg.Risk  3.4    3.3                                   Avg.Risk  5.0    4.4                                2X Avg.Risk  9.6    7.1                                3X Avg.Risk 23.4   11.0   Comprehensive metabolic panel     Status: Abnormal   Collection Time: 11/04/22 11:54 AM  Result Value Ref Range   Glucose 101 (H) 70 - 99 mg/dL   BUN 13 8 - 27 mg/dL   Creatinine, Ser 2.95 0.76 - 1.27 mg/dL   eGFR 83 >62 ZH/YQM/5.78   BUN/Creatinine Ratio 13 10 - 24   Sodium 143 134 - 144 mmol/L   Potassium 4.5 3.5 - 5.2 mmol/L   Chloride 105 96 - 106 mmol/L   CO2 21 20 - 29 mmol/L   Calcium 9.6 8.6 - 10.2 mg/dL   Total Protein 7.2 6.0 - 8.5 g/dL   Albumin 4.6 3.8 - 4.9 g/dL   Globulin, Total 2.6 1.5 - 4.5 g/dL   Bilirubin Total 0.2 0.0 - 1.2 mg/dL   Alkaline Phosphatase 64 44 - 121 IU/L   AST 23 0 - 40 IU/L   ALT 24 0 - 44 IU/L  CBC With Diff/Platelet     Status: None   Collection Time: 11/04/22 11:54 AM  Result Value Ref Range   WBC 5.7 3.4 - 10.8 x10E3/uL   RBC 4.51 4.14 - 5.80 x10E6/uL   Hemoglobin 14.0 13.0 - 17.7 g/dL   Hematocrit 46.9 62.9 - 51.0 %   MCV 95 79 - 97 fL   MCH 31.0 26.6 - 33.0 pg   MCHC 32.7 31.5 - 35.7 g/dL   RDW 52.8 41.3 - 24.4 %   Platelets 187 150 - 450 x10E3/uL   Neutrophils 52 Not  Estab. %   Lymphs 34 Not Estab. %  Monocytes 10 Not Estab. %   Eos 3 Not Estab. %   Basos 1 Not Estab. %   Neutrophils Absolute 2.9 1.4 - 7.0 x10E3/uL   Lymphocytes Absolute 2.0 0.7 - 3.1 x10E3/uL   Monocytes Absolute 0.6 0.1 - 0.9 x10E3/uL   EOS (ABSOLUTE) 0.2 0.0 - 0.4 x10E3/uL   Basophils Absolute 0.1 0.0 - 0.2 x10E3/uL   Immature Granulocytes 0 Not Estab. %   Immature Grans (Abs) 0.0 0.0 - 0.1 x10E3/uL  Hemoglobin A1c     Status: Abnormal   Collection Time: 11/04/22 11:54 AM  Result Value Ref Range   Hgb A1c MFr Bld 6.1 (H) 4.8 - 5.6 %    Comment:          Prediabetes: 5.7 - 6.4          Diabetes: >6.4          Glycemic control for adults with diabetes: <7.0    Est. average glucose Bld gHb Est-mCnc 128 mg/dL  CK     Status: None   Collection Time: 11/04/22 11:54 AM  Result Value Ref Range   Total CK 198 41 - 331 U/L      Assessment & Plan:   As per problem list.  Problem List Items Addressed This Visit       Digestive   Rectal bleeding   Relevant Medications   hydrocortisone (ANUSOL-HC) 2.5 % rectal cream     Musculoskeletal and Integument   Spasm of right trapezius muscle     Other   Hypercholesteremia   Mixed hyperlipidemia   Prediabetes - Primary    Return in about 3 months (around 02/16/2023).   Total time spent: 20 minutes  Luna Fuse, MD  11/17/2022   This document may have been prepared by [facility] Voice Recognition software and as such may include unintentional dictation errors.

## 2022-11-24 ENCOUNTER — Telehealth: Payer: Self-pay | Admitting: Gastroenterology

## 2022-11-24 NOTE — Telephone Encounter (Signed)
Patient called in to schedule an visit with Dr.Anna. Patient stated that his hemorrhoids has started bleeding again. He wanted to know if he could get an earlier appointment. I did schedule him for the next available.

## 2022-11-26 NOTE — Telephone Encounter (Signed)
Patient was informed that Dr. Tobi Bastos did not have anything sooner than he already had scheduled. Patient understood and had no further question.

## 2022-12-07 ENCOUNTER — Other Ambulatory Visit: Payer: Self-pay | Admitting: Cardiovascular Disease

## 2022-12-07 ENCOUNTER — Other Ambulatory Visit: Payer: Self-pay

## 2022-12-16 ENCOUNTER — Telehealth: Payer: Self-pay | Admitting: Gastroenterology

## 2022-12-16 NOTE — Telephone Encounter (Signed)
Called patient to schedule and earlier appointment phone keep ring no answer was not able to leave a voicemail.

## 2022-12-17 NOTE — Telephone Encounter (Signed)
The patient called in wanting to cancel his appointment. I called him to confirmed but the patient stated that he will keep his appointment and call two days before to let us know.

## 2022-12-30 NOTE — Telephone Encounter (Signed)
Patient called in and left a voicemail to cancel office visit for tomorrow. I called the patient back to confirm the cancellation. The patient didn't answer I left a voicemail to call back.

## 2022-12-31 ENCOUNTER — Ambulatory Visit: Payer: Medicaid Other | Admitting: Gastroenterology

## 2023-01-26 ENCOUNTER — Ambulatory Visit (INDEPENDENT_AMBULATORY_CARE_PROVIDER_SITE_OTHER): Payer: Medicaid Other

## 2023-01-26 ENCOUNTER — Ambulatory Visit (INDEPENDENT_AMBULATORY_CARE_PROVIDER_SITE_OTHER): Payer: Medicaid Other | Admitting: Vascular Surgery

## 2023-01-26 ENCOUNTER — Encounter (INDEPENDENT_AMBULATORY_CARE_PROVIDER_SITE_OTHER): Payer: Self-pay | Admitting: Vascular Surgery

## 2023-01-26 VITALS — BP 126/81 | HR 80 | Resp 18 | Ht 73.0 in | Wt 189.4 lb

## 2023-01-26 DIAGNOSIS — I6521 Occlusion and stenosis of right carotid artery: Secondary | ICD-10-CM

## 2023-01-26 DIAGNOSIS — E782 Mixed hyperlipidemia: Secondary | ICD-10-CM | POA: Diagnosis not present

## 2023-01-26 DIAGNOSIS — I10 Essential (primary) hypertension: Secondary | ICD-10-CM | POA: Diagnosis not present

## 2023-01-26 NOTE — Progress Notes (Signed)
MRN : [id number]  Charles Lamb is a 62 y.o. (1960-06-09) male who presents with chief complaint of  Chief Complaint  Patient presents with   Follow-up    6 month follow up with carotid  .  History of Present Illness: Patient returns in follow-up of his carotid disease.  He is doing well.  He denies any focal neurologic symptoms. Specifically, the patient denies amaurosis fugax, speech or swallowing difficulties, or arm or leg weakness or numbness.  He has had occasional vertigo or dizziness with certain head and neck positions but this is infrequent.  Carotid duplex today reveals stable to slightly improved velocities in the right internal carotid artery on the upper end of the 40 to 59% range.  Left carotid velocities remain in the 1 to 39% range.  Current Outpatient Medications  Medication Sig Dispense Refill   aspirin EC 81 MG tablet Take 1 tablet (81 mg total) by mouth daily. Swallow whole. 90 tablet 4   carvedilol (COREG) 3.125 MG tablet Take 1 tablet by mouth twice daily 180 tablet 0   Cholecalciferol (VITAMIN D3) 250 MCG (10000 UT) CAPS Take 1 capsule by mouth daily.     hydrocortisone (ANUSOL-HC) 2.5 % rectal cream Place 1 Application rectally 2 (two) times daily. 28 g 0   rosuvastatin (CRESTOR) 40 MG tablet Take 1 tablet (40 mg total) by mouth daily. 90 tablet 3   sildenafil (VIAGRA) 100 MG tablet Take 1 tablet (100 mg total) by mouth as needed for erectile dysfunction. 10 tablet 2   No current facility-administered medications for this visit.    Past Medical History:  Diagnosis Date   Arthritis    Hemorrhoids    Hyperlipidemia    Hypertension    Renal disorder     Past Surgical History:  Procedure Laterality Date   COLONOSCOPY WITH PROPOFOL N/A 02/07/2019   Procedure: COLONOSCOPY WITH PROPOFOL;  Surgeon: Wyline Mood, MD;  Location: [facility] ENDOSCOPY;  Service: Gastroenterology;  Laterality: N/A;   EYE SURGERY Left    FLEXIBLE SIGMOIDOSCOPY N/A 04/30/2020    Procedure: FLEXIBLE SIGMOIDOSCOPY;  Surgeon: Wyline Mood, MD;  Location: [facility] ENDOSCOPY;  Service: Gastroenterology;  Laterality: N/A;   TOTAL SHOULDER ARTHROPLASTY Left 05/25/2018   Procedure: LEFT SHOULDER HEMIARTHROPLASTY, BICEPS TENODESIS;  Surgeon: Lyndle Herrlich, MD;  Location: [facility] ORS;  Service: Orthopedics;  Laterality: Left;   TOTAL SHOULDER REPLACEMENT Left      Social History   Tobacco Use   Smoking status: Every Day    Current packs/day: 0.25    Average packs/day: 0.3 packs/day for 45.0 years (11.3 ttl pk-yrs)    Types: Cigarettes   Smokeless tobacco: Never  Vaping Use   Vaping status: Never Used  Substance Use Topics   Alcohol use: Yes    Alcohol/week: 2.0 standard drinks of alcohol    Types: 2 Cans of beer per week    Comment: 1 40oz a day   Drug use: Never     Family History  Problem Relation Age of Onset   Kidney disease Mother    Kidney disease Brother    Kidney disease Brother    Kidney disease Brother      No Known Allergies   REVIEW OF SYSTEMS (Negative unless checked)  Constitutional: [] Weight loss  [] Fever  [] Chills Cardiac: [] Chest pain   [] Chest pressure   [] Palpitations   [] Shortness of breath when laying flat   [] Shortness of breath at rest   [] Shortness of breath with exertion. Vascular:  []   Pain in legs with walking   [] Pain in legs at rest   [] Pain in legs when laying flat   [] Claudication   [] Pain in feet when walking  [] Pain in feet at rest  [] Pain in feet when laying flat   [] History of DVT   [] Phlebitis   [] Swelling in legs   [] Varicose veins   [] Non-healing ulcers Pulmonary:   [] Uses home oxygen   [] Productive cough   [] Hemoptysis   [] Wheeze  [] COPD   [] Asthma Neurologic:  [] Dizziness  [] Blackouts   [] Seizures   [] History of stroke   [] History of TIA  [] Aphasia   [] Temporary blindness   [] Dysphagia   [] Weakness or numbness in arms   [] Weakness or numbness in legs Musculoskeletal:  [x] Arthritis   [] Joint swelling   [] Joint pain   [] Low  back pain Hematologic:  [] Easy bruising  [] Easy bleeding   [] Hypercoagulable state   [] Anemic  [] Hepatitis Gastrointestinal:  [] Blood in stool   [] Vomiting blood  [] Gastroesophageal reflux/heartburn   [] Difficulty swallowing. Genitourinary:  [x] Chronic kidney disease   [] Difficult urination  [] Frequent urination  [] Burning with urination   [] Blood in urine Skin:  [] Rashes   [] Ulcers   [] Wounds Psychological:  [] History of anxiety   []  History of major depression.  Physical Examination  Vitals:   01/26/23 1332  BP: 126/81  Pulse: 80  Resp: 18  Weight: 189 lb 6.4 oz (85.9 kg)  Height: 6\' 1"  (1.854 m)   Body mass index is 24.99 kg/m. Gen:  WD/WN, NAD Head: [place]/AT, No temporalis wasting. Ear/Nose/Throat: Hearing grossly intact, nares w/o erythema or drainage, trachea midline Eyes: Conjunctiva clear. Sclera non-icteric Neck: Supple.  Soft right carotid bruit  Pulmonary:  Good air movement, equal and clear to auscultation bilaterally.  Cardiac: RRR, No JVD Vascular:  Vessel Right Left  Radial Palpable Palpable       Musculoskeletal: M/S 5/5 throughout.  No deformity or atrophy. No edema. Neurologic: CN 2-12 intact. Sensation grossly intact in extremities.  Symmetrical.  Speech is fluent. Motor exam as listed above. Psychiatric: Judgment intact, Mood & affect appropriate for pt's clinical situation. Dermatologic: No rashes or ulcers noted.  No cellulitis or open wounds.     CBC Lab Results  Component Value Date   WBC 5.7 11/04/2022   HGB 14.0 11/04/2022   HCT 42.8 11/04/2022   MCV 95 11/04/2022   PLT 187 11/04/2022    BMET    Component Value Date/Time   NA 143 11/04/2022 1154   NA 142 03/30/2011 0523   K 4.5 11/04/2022 1154   K 4.2 03/30/2011 0523   CL 105 11/04/2022 1154   CL 106 03/30/2011 0523   CO2 21 11/04/2022 1154   CO2 26 03/30/2011 0523   GLUCOSE 101 (H) 11/04/2022 1154   GLUCOSE 121 (H) 05/26/2018 0330   GLUCOSE 93 03/30/2011 0523   BUN 13 11/04/2022  1154   BUN 10 03/30/2011 0523   CREATININE 1.03 11/04/2022 1154   CREATININE 1.02 03/30/2011 0523   CALCIUM 9.6 11/04/2022 1154   CALCIUM 9.5 03/30/2011 0523   GFRNONAA 73 10/26/2019 1357   GFRNONAA >60 03/30/2011 0523   GFRAA 84 10/26/2019 1357   GFRAA >60 03/30/2011 0523   CrCl cannot be calculated (Patient's most recent lab result is older than the maximum 21 days allowed.).  COAG Lab Results  Component Value Date   INR 1.0 05/18/2018    Radiology No results found.   Assessment/Plan Carotid stenosis, asymptomatic, right Carotid duplex today reveals  stable to slightly improved velocities in the upper end of the 40 to 59% range on the right side and in the 1 to 39% range on the left side.  Continue aspirin and statin agent.  No plan for intervention at this time.  Recheck in 6 months.  Hypertension blood pressure control important in reducing the progression of atherosclerotic disease. On appropriate oral medications.     Mixed hyperlipidemia lipid control important in reducing the progression of atherosclerotic disease. Continue statin therapy  Festus Barren, MD  01/26/2023 2:13 PM    This note was created with Dragon medical transcription system.  Any errors from dictation are purely unintentional

## 2023-02-17 ENCOUNTER — Other Ambulatory Visit: Payer: Medicaid Other

## 2023-02-17 DIAGNOSIS — E782 Mixed hyperlipidemia: Secondary | ICD-10-CM

## 2023-02-17 DIAGNOSIS — R7303 Prediabetes: Secondary | ICD-10-CM | POA: Diagnosis not present

## 2023-02-18 LAB — COMPREHENSIVE METABOLIC PANEL
ALT: 24 [IU]/L (ref 0–44)
AST: 24 [IU]/L (ref 0–40)
Albumin: 4.4 g/dL (ref 3.8–4.9)
Alkaline Phosphatase: 63 [IU]/L (ref 44–121)
BUN/Creatinine Ratio: 11 (ref 10–24)
BUN: 10 mg/dL (ref 8–27)
Bilirubin Total: 0.4 mg/dL (ref 0.0–1.2)
CO2: 22 mmol/L (ref 20–29)
Calcium: 9.5 mg/dL (ref 8.6–10.2)
Chloride: 104 mmol/L (ref 96–106)
Creatinine, Ser: 0.95 mg/dL (ref 0.76–1.27)
Globulin, Total: 2.8 g/dL (ref 1.5–4.5)
Glucose: 98 mg/dL (ref 70–99)
Potassium: 4 mmol/L (ref 3.5–5.2)
Sodium: 141 mmol/L (ref 134–144)
Total Protein: 7.2 g/dL (ref 6.0–8.5)
eGFR: 92 mL/min/{1.73_m2} (ref >59)

## 2023-02-18 LAB — LIPID PANEL
Chol/HDL Ratio: 2.9 {ratio} (ref 0.0–5.0)
Cholesterol, Total: 190 mg/dL (ref 100–199)
HDL: 65 mg/dL (ref >39)
LDL Chol Calc (NIH): 108 mg/dL — ABNORMAL HIGH (ref 0–99)
Triglycerides: 96 mg/dL (ref 0–149)
VLDL Cholesterol Cal: 17 mg/dL (ref 5–40)

## 2023-02-18 LAB — HEMOGLOBIN A1C
Est. average glucose Bld gHb Est-mCnc: 128 mg/dL
Hgb A1c MFr Bld: 6.1 % — ABNORMAL HIGH (ref 4.8–5.6)

## 2023-02-18 LAB — CK: Total CK: 254 U/L (ref 41–331)

## 2023-02-19 ENCOUNTER — Encounter: Payer: Self-pay | Admitting: Internal Medicine

## 2023-02-19 ENCOUNTER — Ambulatory Visit (INDEPENDENT_AMBULATORY_CARE_PROVIDER_SITE_OTHER): Payer: Medicaid Other | Admitting: Internal Medicine

## 2023-02-19 VITALS — BP 118/80 | HR 82 | Ht 78.0 in | Wt 187.2 lb

## 2023-02-19 DIAGNOSIS — M62830 Muscle spasm of back: Secondary | ICD-10-CM

## 2023-02-19 DIAGNOSIS — E782 Mixed hyperlipidemia: Secondary | ICD-10-CM | POA: Diagnosis not present

## 2023-02-19 DIAGNOSIS — I10 Essential (primary) hypertension: Secondary | ICD-10-CM | POA: Diagnosis not present

## 2023-02-19 MED ORDER — EZETIMIBE 10 MG PO TABS: 10.0000 mg | ORAL_TABLET | Freq: Every day | ORAL | 0 refills | Status: DC

## 2023-02-19 NOTE — Progress Notes (Signed)
Established Patient Office Visit  Subjective:  Patient ID: Charles Lamb, male    DOB: [birth year]  Age: 62 y.o. MRN: [id number]  Chief Complaint  Patient presents with   Follow-up    No new complaints, here for lab review and medication refills. Labs reviewed and notable for elevated LDL and stable A1c in the prediabetic range. Lost weight with reduced calorie intake. Neck pain has improved.  No other concerns at this time.   Past Medical History:  Diagnosis Date   Arthritis    Hemorrhoids    Hyperlipidemia    Hypertension    Renal disorder     Past Surgical History:  Procedure Laterality Date   COLONOSCOPY WITH PROPOFOL N/A 02/07/2019   Procedure: COLONOSCOPY WITH PROPOFOL;  Surgeon: Wyline Mood, MD;  Location: [facility] ENDOSCOPY;  Service: Gastroenterology;  Laterality: N/A;   EYE SURGERY Left    FLEXIBLE SIGMOIDOSCOPY N/A 04/30/2020   Procedure: FLEXIBLE SIGMOIDOSCOPY;  Surgeon: Wyline Mood, MD;  Location: [facility] ENDOSCOPY;  Service: Gastroenterology;  Laterality: N/A;   TOTAL SHOULDER ARTHROPLASTY Left 05/25/2018   Procedure: LEFT SHOULDER HEMIARTHROPLASTY, BICEPS TENODESIS;  Surgeon: Lyndle Herrlich, MD;  Location: [facility] ORS;  Service: Orthopedics;  Laterality: Left;   TOTAL SHOULDER REPLACEMENT Left     Social History   Socioeconomic History   Marital status: Single    Spouse name: Not on file   Number of children: 0   Years of education: Not on file   Highest education level: High school graduate  Occupational History   Occupation: disability    Comment: Former Hospital doctor  Tobacco Use   Smoking status: Every Day    Current packs/day: 0.25    Average packs/day: 0.3 packs/day for 45.0 years (11.3 ttl pk-yrs)    Types: Cigarettes   Smokeless tobacco: Never  Vaping Use   Vaping status: Never Used  Substance and Sexual Activity   Alcohol use: Yes    Alcohol/week: 2.0 standard drinks of alcohol    Types: 2 Cans of beer per week    Comment: 1 40oz a day   Drug use:  Never   Sexual activity: Not on file  Other Topics Concern   Not on file  Social History Narrative   ** Merged History Encounter **       Social Determinants of Health   Financial Resource Strain: Medium Risk (12/18/2021)   Overall Financial Resource Strain (CARDIA)    Difficulty of Paying Living Expenses: Somewhat hard  Food Insecurity: No Food Insecurity (12/18/2021)   Hunger Vital Sign    Worried About Running Out of Food in the Last Year: Never true    Ran Out of Food in the Last Year: Never true  Transportation Needs: No Transportation Needs (12/18/2021)   PRAPARE - Administrator, Civil Service (Medical): No    Lack of Transportation (Non-Medical): No  Physical Activity: Inactive (01/12/2018)   Exercise Vital Sign    Days of Exercise per Week: 0 days    Minutes of Exercise per Session: 0 min  Stress: Not on file  Social Connections: Unknown (01/12/2018)   Social Connection and Isolation Panel [NHANES]    Frequency of Communication with Friends and Family: More than three times a week    Frequency of Social Gatherings with Friends and Family: More than three times a week    Attends Religious Services: Not asked    Active Member of Clubs or Organizations: Not on file    Attends Club or  Organization Meetings: Not on file    Marital Status: Not on file  Intimate Partner Violence: Not on file    Family History  Problem Relation Age of Onset   Kidney disease Mother    Kidney disease Brother    Kidney disease Brother    Kidney disease Brother     No Known Allergies  Outpatient Medications Prior to Visit  Medication Sig   aspirin EC 81 MG tablet Take 1 tablet (81 mg total) by mouth daily. Swallow whole.   carvedilol (COREG) 3.125 MG tablet Take 1 tablet by mouth twice daily   Cholecalciferol (VITAMIN D3) 250 MCG (10000 UT) CAPS Take 1 capsule by mouth daily.   hydrocortisone (ANUSOL-HC) 2.5 % rectal cream Place 1 Application rectally 2 (two) times daily.    rosuvastatin (CRESTOR) 40 MG tablet Take 1 tablet (40 mg total) by mouth daily.   sildenafil (VIAGRA) 100 MG tablet Take 1 tablet (100 mg total) by mouth as needed for erectile dysfunction.   No facility-administered medications prior to visit.    Review of Systems  Constitutional: Negative.   HENT: Negative.    Eyes:        Left eye visual loss  Respiratory: Negative.    Cardiovascular: Negative.   Gastrointestinal: Negative.   Genitourinary: Negative.   Skin: Negative.   Neurological: Negative.   Endo/Heme/Allergies: Negative.        Objective:   BP 118/80   Pulse 82   Ht 6\' 6"  (1.981 m)   Wt 187 lb 3.2 oz (84.9 kg)   SpO2 98%   BMI 21.63 kg/m   Vitals:   02/19/23 1420  BP: 118/80  Pulse: 82  Height: 6\' 6"  (1.981 m)  Weight: 187 lb 3.2 oz (84.9 kg)  SpO2: 98%  BMI (Calculated): 21.64    Physical Exam Vitals reviewed.  Constitutional:      Appearance: Normal appearance.  HENT:     Head: Normocephalic.     Left Ear: There is no impacted cerumen.     Nose: Nose normal.     Mouth/Throat:     Mouth: Mucous membranes are moist.     Pharynx: No posterior oropharyngeal erythema.  Eyes:     General: Lids are normal. Visual field deficit present.     Extraocular Movements: Extraocular movements intact.     Pupils: Pupils are equal, round, and reactive to light.     Comments: Opaque left cornea  Cardiovascular:     Rate and Rhythm: Regular rhythm.     Chest Wall: PMI is not displaced.     Pulses: Normal pulses.     Heart sounds: Normal heart sounds. No murmur heard. Pulmonary:     Effort: Pulmonary effort is normal.     Breath sounds: Normal air entry. No rhonchi or rales.  Abdominal:     General: Abdomen is flat. Bowel sounds are normal. There is no distension.     Palpations: Abdomen is soft. There is no hepatomegaly, splenomegaly or mass.     Tenderness: There is no abdominal tenderness.  Musculoskeletal:        General: Normal range of motion.      Cervical back: Normal range of motion and neck supple. No tenderness. Muscular tenderness (upper trap and right paracervical) present.     Right lower leg: No edema.     Left lower leg: No edema.  Skin:    General: Skin is warm and dry.  Neurological:     General:  No focal deficit present.     Mental Status: He is alert and oriented to person, place, and time.     Cranial Nerves: No cranial nerve deficit.     Motor: No weakness.     Deep Tendon Reflexes: Reflexes abnormal.     Comments: Hyporeflexic globally  Psychiatric:        Mood and Affect: Mood normal.        Behavior: Behavior normal.    No results found for any visits on 02/19/23.  Recent Results (from the past 2160 hour(Ezabella Teska))  CK     Status: None   Collection Time: 02/17/23  8:56 AM  Result Value Ref Range   Total CK 254 41 - 331 U/L  Hemoglobin A1c     Status: Abnormal   Collection Time: 02/17/23  8:56 AM  Result Value Ref Range   Hgb A1c MFr Bld 6.1 (H) 4.8 - 5.6 %    Comment:          Prediabetes: 5.7 - 6.4          Diabetes: >6.4          Glycemic control for adults with diabetes: <7.0    Est. average glucose Bld gHb Est-mCnc 128 mg/dL  Lipid panel     Status: Abnormal   Collection Time: 02/17/23  8:56 AM  Result Value Ref Range   Cholesterol, Total 190 100 - 199 mg/dL   Triglycerides 96 0 - 149 mg/dL   HDL 65 >16 mg/dL   VLDL Cholesterol Cal 17 5 - 40 mg/dL   LDL Chol Calc (NIH) 109 (H) 0 - 99 mg/dL   Chol/HDL Ratio 2.9 0.0 - 5.0 ratio    Comment:                                   T. Chol/HDL Ratio                                             Men  Women                               1/2 Avg.Risk  3.4    3.3                                   Avg.Risk  5.0    4.4                                2X Avg.Risk  9.6    7.1                                3X Avg.Risk 23.4   11.0   Comprehensive metabolic panel     Status: None   Collection Time: 02/17/23  8:56 AM  Result Value Ref Range   Glucose 98 70 - 99 mg/dL    BUN 10 8 - 27 mg/dL   Creatinine, Ser 6.04 0.76 - 1.27 mg/dL   eGFR 92 >54 UJ/WJX/9.14   BUN/Creatinine Ratio 11 10 - 24  Sodium 141 134 - 144 mmol/L   Potassium 4.0 3.5 - 5.2 mmol/L   Chloride 104 96 - 106 mmol/L   CO2 22 20 - 29 mmol/L   Calcium 9.5 8.6 - 10.2 mg/dL   Total Protein 7.2 6.0 - 8.5 g/dL   Albumin 4.4 3.8 - 4.9 g/dL   Globulin, Total 2.8 1.5 - 4.5 g/dL   Bilirubin Total 0.4 0.0 - 1.2 mg/dL   Alkaline Phosphatase 63 44 - 121 IU/L   AST 24 0 - 40 IU/L   ALT 24 0 - 44 IU/L      Assessment & Plan:  As per problem list. Add Zetia for better LDL control, declines Repatha and Nexlizet not covered by his insurance. Problem List Items Addressed This Visit       Other   Mixed hyperlipidemia - Primary   Relevant Medications   ezetimibe (ZETIA) 10 MG tablet    Return in about 3 months (around 05/20/2023) for fu with labs prior.   Total time spent: 20 minutes  Luna Fuse, MD  02/19/2023   This document may have been prepared by [facility] Voice Recognition software and as such may include unintentional dictation errors.

## 2023-03-07 ENCOUNTER — Other Ambulatory Visit: Payer: Self-pay | Admitting: Cardiovascular Disease

## 2023-03-12 ENCOUNTER — Ambulatory Visit: Payer: Medicaid Other | Attending: Physician Assistant | Admitting: Emergency Medicine

## 2023-03-12 ENCOUNTER — Encounter: Payer: Self-pay | Admitting: Physician Assistant

## 2023-03-12 VITALS — BP 138/74 | HR 66 | Ht 73.5 in | Wt 183.2 lb

## 2023-03-12 DIAGNOSIS — I2510 Atherosclerotic heart disease of native coronary artery without angina pectoris: Secondary | ICD-10-CM

## 2023-03-12 DIAGNOSIS — F1010 Alcohol abuse, uncomplicated: Secondary | ICD-10-CM

## 2023-03-12 DIAGNOSIS — E785 Hyperlipidemia, unspecified: Secondary | ICD-10-CM | POA: Diagnosis not present

## 2023-03-12 DIAGNOSIS — R9431 Abnormal electrocardiogram [ECG] [EKG]: Secondary | ICD-10-CM | POA: Diagnosis not present

## 2023-03-12 DIAGNOSIS — I340 Nonrheumatic mitral (valve) insufficiency: Secondary | ICD-10-CM

## 2023-03-12 DIAGNOSIS — I517 Cardiomegaly: Secondary | ICD-10-CM | POA: Diagnosis not present

## 2023-03-12 DIAGNOSIS — I779 Disorder of arteries and arterioles, unspecified: Secondary | ICD-10-CM

## 2023-03-12 DIAGNOSIS — Z720 Tobacco use: Secondary | ICD-10-CM

## 2023-03-12 NOTE — Patient Instructions (Signed)
Medication Instructions:  Your Physician recommend you continue on your current medication as directed.    *If you need a refill on your cardiac medications before your next appointment, please call your pharmacy*   Lab Work: None ordered at this time    Testing/Procedures: None ordered at this time    Follow-Up: At [facility], you and your health needs are our priority.  As part of our continuing mission to provide you with exceptional heart care, we have created designated Provider Care Teams.  These Care Teams include your primary Cardiologist (physician) and Advanced Practice Providers (APPs -  Physician Assistants and Nurse Practitioners) who all work together to provide you with the care you need, when you need it.  We recommend signing up for the patient portal called "MyChart".  Sign up information is provided on this After Visit Summary.  MyChart is used to connect with patients for Virtual Visits (Telemedicine).  Patients are able to view lab/test results, encounter notes, upcoming appointments, etc.  Non-urgent messages can be sent to your provider as well.   To learn more about what you can do with MyChart, go to [email or web address].    Your next appointment:   6 month(s)  Provider:   You may see Lorine Bears, MD or one of the following Advanced Practice Providers on your designated Care Team:   Eula Listen, [place]

## 2023-03-12 NOTE — Progress Notes (Signed)
Cardiology Office Note:    Date:  03/12/2023  ID:  Henderson Cloud, DOB [birth year], MRN [id number] PCP: Sherron Monday, MD  [facility] Providers Cardiologist:  Lorine Bears, MD       Patient Profile:      Charles Lamb is a 62 year old male with visit pertinent history of coronary artery calcification noted on CT imaging, abnormal EKG with LVH, carotid artery disease, HLD, tobacco use.  Carotid artery ultrasound in January 2024 showed 50 to 69% right ICA stenosis with minimal left ICA stenosis.  In the setting his PCP referred him to vascular surgery.  He was evaluated by Dr. Kirke Corin in February 2024 for longstanding history of abnormal EKGs with anterior lateral ST elevation dating back to approximately 10 years ago.  At that time he reported exertional dyspnea without exertional chest pain.  His EKG was felt to be suggestive of hypertrophic cardiomyopathy.  Lexiscan MPI in February 2024 showed no evidence of ischemia or infarction and was overall low risk.  CT corrected images showed moderate coronary artery calcification.  Echo March 2024 demonstrated an EF of 60-65%, no RWMA, moderate LVH, grade 1 DD, normal RV SF, mild mitral regurgitation, aortic valve sclerosis.      History of Present Illness:  Charles Lamb is a 63 y.o. male who returns for six month follow-up for abnormal EKG with LVH.   He comes into clinic today by himself.  He notes that since the last time we saw him he has been without any cardiac issues, concerns or complaints.  He overall feels like he is doing well.  He is not currently working.  He currently does not get much exercise and tends to play cards in his free time for fun.  We spoke regarding his social history and he noted that he does smoke cigarettes daily.  He notes that he has history of cocaine and crack usage up until 2008.  He noted that he would use crack/cocaine daily for many years however has been clean over the last 16.  He also  notes history of heavy alcohol use most of his adulthood.  He notes that he currently drinks one to two 40oz Natural lights everyday.   He denies chest pain, shortness of breath, lower extremity edema, fatigue, palpitations, melena, hematuria, hemoptysis, diaphoresis, weakness, presyncope, syncope, orthopnea, and PND.       Review of Systems  Constitutional: Negative for weight gain and weight loss.  Cardiovascular:  Negative for chest pain, claudication, dyspnea on exertion, irregular heartbeat, leg swelling, near-syncope, orthopnea, palpitations, paroxysmal nocturnal dyspnea and syncope.  Respiratory:  Negative for cough, hemoptysis and shortness of breath.   Gastrointestinal:  Negative for abdominal pain, hematochezia and melena.  Genitourinary:  Negative for hematuria.  Neurological:  Negative for dizziness and light-headedness.     See HPI    Studies Reviewed:   EKG Interpretation Date/Time:  Friday March 12 2023 11:03:32 EST Ventricular Rate:  66 PR Interval:  184 QRS Duration:  78 QT Interval:  390 QTC Calculation: 408 R Axis:   17  Text Interpretation: Normal sinus rhythm with a nterolateral ST elevation with no reciprocal chnages unchanged from prior EKGs over past 10 years. Confirmed by Rise Paganini [phone]) on 03/12/2023 5:00:05 PM   VAS US Carotid 07/17/2022 Right Carotid: Velocities in the right ICA are consistent with a 40-59% stenosis.  Left Carotid: Velocities in the left ICA are consistent with a 1-39% stenosis.   Echocardiogram 06/04/2022  1.  Left ventricular ejection fraction, by estimation, is 60 to 65%. The  left ventricle has normal function. The left ventricle has no regional  wall motion abnormalities. There is moderate left ventricular hypertrophy.  Left ventricular diastolic  parameters are consistent with Grade I diastolic dysfunction (impaired  relaxation). The average left ventricular global longitudinal strain is  -17.7 %.   2. Right  ventricular systolic function is normal. The right ventricular  size is normal.   3. The mitral valve is normal in structure. Mild mitral valve  regurgitation. No evidence of mitral stenosis.   4. The aortic valve is normal in structure. There is mild calcification  of the aortic valve. Aortic valve regurgitation is not visualized. Aortic  valve sclerosis is present, with no evidence of aortic valve stenosis.   5. The inferior vena cava is normal in size with greater than 50%  respiratory variability, suggesting right atrial pressure of 3 mmHg.   Lexiscan MPI 04/23/2022   The study is normal. The study is low risk.   ST elevation (V2, V3 and V4) was noted. this is chronic.   LV perfusion is normal. There is no evidence of ischemia. There is no evidence of infarction.   Left ventricular function is normal. End diastolic cavity size is normal. End systolic cavity size is normal.   CT attenuation images showed moderate coronary calcification. Risk Assessment/Calculations:             Physical Exam:   VS:  BP 138/74 (BP Location: Left Arm, Patient Position: Sitting, Cuff Size: Normal)   Pulse 66   Ht 6' 1.5" (1.867 m)   Wt 183 lb 3.2 oz (83.1 kg)   SpO2 99%   BMI 23.84 kg/m    Wt Readings from Last 3 Encounters:  03/12/23 183 lb 3.2 oz (83.1 kg)  02/19/23 187 lb 3.2 oz (84.9 kg)  01/26/23 189 lb 6.4 oz (85.9 kg)    Constitutional:      Appearance: Normal and healthy appearance.  Neck:     Vascular: JVD normal.  Pulmonary:     Effort: Pulmonary effort is normal.     Breath sounds: Normal breath sounds.  Chest:     Chest wall: Not tender to palpatation.  Cardiovascular:     PMI at left midclavicular line. Normal rate. Regular rhythm. Normal S1. Normal S2.      Murmurs: There is no murmur.     No gallop.  No click. No rub.  Pulses:    Intact distal pulses.  Edema:    Peripheral edema absent.  Musculoskeletal: Normal range of motion.     Cervical back: Normal range of motion  and neck supple. Skin:    General: Skin is warm and dry.  Neurological:     General: No focal deficit present.     Mental Status: Alert and oriented to person, place and time.  Psychiatric:        Behavior: Behavior is cooperative.        Assessment and Plan:  Highly Abnormal EKG/LVH -He has a history of abnormal EKG with anterolateral ST elevation that dates back approximately 10 years.  Lexiscan MPI 04/2022 showed no evidence of infarction or ischemia.  Echocardiogram 05/2022 showed LVEF 60 to 65%, moderate LVH, no evidence of hypertrophic cardiomyopathy.  He has no family history of cardiomyopathy/sudden cardiac death.  This is likely related to his past crack, cocaine, and heavy alcohol usage.  He denies illicit drug use since 2008.  He is  currently asymptomatic with no symptoms suggestive of angina or cardiac decompensation.  He has deferred cardiac MRI in the past.  As he is asymptomatic and recent echocardiogram without hypertrophic cardiomyopathy, I do not feel cardiac MRI is warranted.  Continue adequate blood pressure control.  He remains on carvedilol 3.125 mg twice daily.  Coronary artery calcification / Hyperlipidemia -Stable with no anginal symptoms, no indication for ischemic evaluation. CT attenuation images from Lexiscan MPI on 04/2022 showed moderate coronary calcification.  LDL cholesterol 108 on 02/2023.  His current goal set to less than 70.  He was recently started on Zetia with his PCP with plans to have repeat lipid panel in 3 months.  Continue aspirin 81 mg, Zetia 10 mg, rosuvastatin 40 mg.  Recommend heart healthy and Mediterranean dieting.  Carotid artery disease -Carotid artery ultrasound from 01/2023 showed right ICA consistent with 40 to 59% stenosis and left ICA consistent with 1-39% stenosis.  Currently followed by vascular surgery.  He remains on aspirin, rosuvastatin, Zetia.  Mitral valve regurgitation -Mild mitral valve regurgitation noted on echocardiogram  05/2022.  Recommend repeat 3 to 5 years.  Tobacco abuse -Encouraged complete cessation  Alcohol abuse -Currently drinks one to two 40oz beers daily and history of heavy alcohol use in his past.  Encouraged he slowly decrease amount of alcohol he is drinking daily.               Dispo:  Return in about 6 months (around 09/10/2023).  Signed, Denyce Robert, NP

## 2023-03-29 ENCOUNTER — Other Ambulatory Visit: Payer: Self-pay | Admitting: Nurse Practitioner

## 2023-03-30 NOTE — Telephone Encounter (Signed)
 Requested Prescriptions  Refused Prescriptions Disp Refills   rosuvastatin  (CRESTOR ) 40 MG tablet [Pharmacy Med Name: Rosuvastatin  Calcium  40 MG Oral Tablet] 90 tablet 0    Sig: Take 1 tablet by mouth once daily     Cardiovascular:  Antilipid - Statins 2 Failed - 03/30/2023  1:48 PM      Failed - Valid encounter within last 12 months    Recent Outpatient Visits           12 months ago Aortic atherosclerosis (HCC)   [place] [facility] [place], Melanie T, NP   1 year ago Primary hypertension   [place] [facility] Mecum, Rocky BRAVO, PA-C   1 year ago Hyperlipidemia, unspecified hyperlipidemia type   [place] [facility] V[place], Avanti, MD   1 year ago Chronic right shoulder pain   [place] [facility] Vigg, Avanti, MD   2 years ago Hypercholesteremia   [place] [facility] Vigg, Avanti, MD       Future Appointments             In 1 month Albina GORMAN Dine, MD [facility]            Failed - Lipid Panel in normal range within the last 12 months    Cholesterol, Total  Date Value Ref Range Status  02/17/2023 190 100 - 199 mg/dL Final   LDL Chol Calc (NIH)  Date Value Ref Range Status  02/17/2023 108 (H) 0 - 99 mg/dL Final   HDL  Date Value Ref Range Status  02/17/2023 65 >39 mg/dL Final   Triglycerides  Date Value Ref Range Status  02/17/2023 96 0 - 149 mg/dL Final         Passed - Cr in normal range and within 360 days    Creatinine  Date Value Ref Range Status  03/30/2011 1.02 0.60 - 1.30 mg/dL Final   Creatinine, Ser  Date Value Ref Range Status  02/17/2023 0.95 0.76 - 1.27 mg/dL Final         Passed - Patient is not pregnant

## 2023-03-31 ENCOUNTER — Other Ambulatory Visit: Payer: Self-pay | Admitting: Nurse Practitioner

## 2023-03-31 NOTE — Telephone Encounter (Signed)
 Unable to refill per protocol,  another provider listed as PCP.  Requested Prescriptions  Pending Prescriptions Disp Refills   rosuvastatin  (CRESTOR ) 40 MG tablet [Pharmacy Med Name: Rosuvastatin  Calcium  40 MG Oral Tablet] 90 tablet 0    Sig: Take 1 tablet by mouth once daily     Cardiovascular:  Antilipid - Statins 2 Failed - 03/31/2023  2:04 PM      Failed - Valid encounter within last 12 months    Recent Outpatient Visits           1 year ago Aortic atherosclerosis (HCC)   [place] [facility] [place], Sanjuan Crumbly T, NP   1 year ago Primary hypertension   [place] [facility] Mecum, Pearla [place], PA-C   1 year ago Hyperlipidemia, unspecified hyperlipidemia type   [place] [facility] Vigg, Avanti, MD   1 year ago Chronic right shoulder pain   [place] [facility] Vigg, Avanti, MD   2 years ago Hypercholesteremia    [facility] Vigg, Avanti, MD       Future Appointments             In 1 month Shari Daughters, MD [facility]            Failed - Lipid Panel in normal range within the last 12 months    Cholesterol, Total  Date Value Ref Range Status  02/17/2023 190 100 - 199 mg/dL Final   LDL Chol Calc (NIH)  Date Value Ref Range Status  02/17/2023 108 (H) 0 - 99 mg/dL Final   HDL  Date Value Ref Range Status  02/17/2023 65 >39 mg/dL Final   Triglycerides  Date Value Ref Range Status  02/17/2023 96 0 - 149 mg/dL Final         Passed - Cr in normal range and within 360 days    Creatinine  Date Value Ref Range Status  03/30/2011 1.02 0.60 - 1.30 mg/dL Final   Creatinine, Ser  Date Value Ref Range Status  02/17/2023 0.95 0.76 - 1.27 mg/dL Final         Passed - Patient is not pregnant

## 2023-04-02 ENCOUNTER — Other Ambulatory Visit: Payer: Self-pay

## 2023-04-02 ENCOUNTER — Other Ambulatory Visit: Payer: Self-pay | Admitting: Nurse Practitioner

## 2023-04-02 MED ORDER — ROSUVASTATIN CALCIUM 40 MG PO TABS: 40.0000 mg | ORAL_TABLET | Freq: Every day | ORAL | 1 refills | Status: DC

## 2023-04-02 NOTE — Telephone Encounter (Signed)
Unable to refill per protocol, another provider listed as PCP.  Requested Prescriptions  Pending Prescriptions Disp Refills   rosuvastatin (CRESTOR) 40 MG tablet [Pharmacy Med Name: Rosuvastatin Calcium 40 MG Oral Tablet] 90 tablet 0    Sig: Take 1 tablet by mouth once daily     Cardiovascular:  Antilipid - Statins 2 Failed - 04/02/2023 10:59 AM      Failed - Valid encounter within last 12 months    Recent Outpatient Visits           1 year ago Aortic atherosclerosis (HCC)   [place] [facility] [place], Corrie Dandy T, NP   1 year ago Primary hypertension   [place] [facility] Mecum, Oswaldo Conroy, PA-C   1 year ago Hyperlipidemia, unspecified hyperlipidemia type   [place] [facility] Vigg, Avanti, MD   1 year ago Chronic right shoulder pain   [place] [facility] Vigg, Avanti, MD   2 years ago Hypercholesteremia   [place] [facility] Vigg, Avanti, MD       Future Appointments             In 1 month Sherron Monday, MD [facility]            Failed - Lipid Panel in normal range within the last 12 months    Cholesterol, Total  Date Value Ref Range Status  02/17/2023 190 100 - 199 mg/dL Final   LDL Chol Calc (NIH)  Date Value Ref Range Status  02/17/2023 108 (H) 0 - 99 mg/dL Final   HDL  Date Value Ref Range Status  02/17/2023 65 >39 mg/dL Final   Triglycerides  Date Value Ref Range Status  02/17/2023 96 0 - 149 mg/dL Final         Passed - Cr in normal range and within 360 days    Creatinine  Date Value Ref Range Status  03/30/2011 1.02 0.60 - 1.30 mg/dL Final   Creatinine, Ser  Date Value Ref Range Status  02/17/2023 0.95 0.76 - 1.27 mg/dL Final         Passed - Patient is not pregnant

## 2023-05-13 ENCOUNTER — Other Ambulatory Visit: Payer: Self-pay | Admitting: Internal Medicine

## 2023-05-13 DIAGNOSIS — E782 Mixed hyperlipidemia: Secondary | ICD-10-CM

## 2023-05-28 ENCOUNTER — Ambulatory Visit: Payer: Medicaid Other | Admitting: Internal Medicine

## 2023-06-06 ENCOUNTER — Other Ambulatory Visit: Payer: Self-pay | Admitting: Cardiovascular Disease

## 2023-06-08 ENCOUNTER — Other Ambulatory Visit: Payer: Self-pay

## 2023-06-29 ENCOUNTER — Other Ambulatory Visit

## 2023-06-29 DIAGNOSIS — R7303 Prediabetes: Secondary | ICD-10-CM | POA: Diagnosis not present

## 2023-06-29 DIAGNOSIS — I10 Essential (primary) hypertension: Secondary | ICD-10-CM

## 2023-06-29 DIAGNOSIS — E782 Mixed hyperlipidemia: Secondary | ICD-10-CM

## 2023-06-30 LAB — HEMOGLOBIN A1C
Est. average glucose Bld gHb Est-mCnc: 120 mg/dL
Hgb A1c MFr Bld: 5.8 % — ABNORMAL HIGH (ref 4.8–5.6)

## 2023-06-30 LAB — HEPATIC FUNCTION PANEL
ALT: 36 IU/L (ref 0–44)
AST: 31 IU/L (ref 0–40)
Albumin: 4.4 g/dL (ref 3.9–4.9)
Alkaline Phosphatase: 153 IU/L — ABNORMAL HIGH (ref 44–121)
Bilirubin Total: 0.4 mg/dL (ref 0.0–1.2)
Bilirubin, Direct: 0.15 mg/dL (ref 0.00–0.40)
Total Protein: 7 g/dL (ref 6.0–8.5)

## 2023-06-30 LAB — LIPID PANEL
Chol/HDL Ratio: 2.7 ratio (ref 0.0–5.0)
Cholesterol, Total: 206 mg/dL — ABNORMAL HIGH (ref 100–199)
HDL: 77 mg/dL (ref >39)
LDL Chol Calc (NIH): 111 mg/dL — ABNORMAL HIGH (ref 0–99)
Triglycerides: 101 mg/dL (ref 0–149)
VLDL Cholesterol Cal: 18 mg/dL (ref 5–40)

## 2023-06-30 LAB — CMP14+EGFR
ALT: 39 IU/L (ref 0–44)
AST: 34 IU/L (ref 0–40)
Albumin: 4.7 g/dL (ref 3.9–4.9)
Alkaline Phosphatase: 65 IU/L (ref 44–121)
BUN/Creatinine Ratio: 11 (ref 10–24)
BUN: 11 mg/dL (ref 8–27)
Bilirubin Total: <0.2 mg/dL (ref 0.0–1.2)
CO2: 22 mmol/L (ref 20–29)
Calcium: 9.6 mg/dL (ref 8.6–10.2)
Chloride: 102 mmol/L (ref 96–106)
Creatinine, Ser: 1 mg/dL (ref 0.76–1.27)
Globulin, Total: 2.6 g/dL (ref 1.5–4.5)
Glucose: 91 mg/dL (ref 70–99)
Potassium: 4 mmol/L (ref 3.5–5.2)
Sodium: 139 mmol/L (ref 134–144)
Total Protein: 7.3 g/dL (ref 6.0–8.5)
eGFR: 86 mL/min/{1.73_m2} (ref >59)

## 2023-06-30 LAB — CK: Total CK: 53 U/L (ref 41–331)

## 2023-07-05 ENCOUNTER — Ambulatory Visit: Admitting: Internal Medicine

## 2023-07-05 ENCOUNTER — Encounter: Payer: Self-pay | Admitting: Internal Medicine

## 2023-07-05 VITALS — BP 130/78 | HR 77 | Temp 96.2°F | Ht 73.5 in | Wt 183.6 lb

## 2023-07-05 DIAGNOSIS — I10 Essential (primary) hypertension: Secondary | ICD-10-CM

## 2023-07-05 DIAGNOSIS — E782 Mixed hyperlipidemia: Secondary | ICD-10-CM | POA: Diagnosis not present

## 2023-07-05 DIAGNOSIS — R7303 Prediabetes: Secondary | ICD-10-CM | POA: Diagnosis not present

## 2023-07-05 NOTE — Progress Notes (Signed)
 Established Patient Office Visit  Subjective:  Patient ID: Charles Lamb, male    DOB: 1960-12-19  Age: 63 y.o. MRN: [id number]  Chief Complaint  Patient presents with   Follow-up    3 month lab results    C/o palpitations with Zetia  and had to discontinue it. Labs reviewed and notable for improvement in A1c but deterioration in TC and LDL.    No other concerns at this time.   Past Medical History:  Diagnosis Date   Arthritis    Hemorrhoids    Hyperlipidemia    Hypertension    Renal disorder     Past Surgical History:  Procedure Laterality Date   COLONOSCOPY WITH PROPOFOL  N/A 02/07/2019   Procedure: COLONOSCOPY WITH PROPOFOL ;  Surgeon: Luke Salaam, MD;  Location: [facility] ENDOSCOPY;  Service: Gastroenterology;  Laterality: N/A;   EYE SURGERY Left    FLEXIBLE SIGMOIDOSCOPY N/A 04/30/2020   Procedure: FLEXIBLE SIGMOIDOSCOPY;  Surgeon: Luke Salaam, MD;  Location: [facility] ENDOSCOPY;  Service: Gastroenterology;  Laterality: N/A;   TOTAL SHOULDER ARTHROPLASTY Left 05/25/2018   Procedure: LEFT SHOULDER HEMIARTHROPLASTY, BICEPS TENODESIS;  Surgeon: Jerlyn Moons, MD;  Location: [facility] ORS;  Service: Orthopedics;  Laterality: Left;   TOTAL SHOULDER REPLACEMENT Left     Social History   Socioeconomic History   Marital status: Single    Spouse name: Not on file   Number of children: 0   Years of education: Not on file   Highest education level: High school graduate  Occupational History   Occupation: disability    Comment: Former Hospital doctor  Tobacco Use   Smoking status: Every Day    Current packs/day: 0.25    Average packs/day: 0.3 packs/day for 45.0 years (11.3 ttl pk-yrs)    Types: Cigarettes   Smokeless tobacco: Never  Vaping Use   Vaping status: Never Used  Substance and Sexual Activity   Alcohol use: Yes    Alcohol/week: 2.0 standard drinks of alcohol    Types: 2 Cans of beer per week    Comment: 1 40oz a day   Drug use: Never   Sexual activity: Not on file  Other  Topics Concern   Not on file  Social History Narrative   ** Merged History Encounter **       Social Drivers of Health   Financial Resource Strain: Medium Risk (12/18/2021)   Overall Financial Resource Strain (CARDIA)    Difficulty of Paying Living Expenses: Somewhat hard  Food Insecurity: No Food Insecurity (12/18/2021)   Hunger Vital Sign    Worried About Running Out of Food in the Last Year: Never true    Ran Out of Food in the Last Year: Never true  Transportation Needs: No Transportation Needs (12/18/2021)   PRAPARE - Administrator, Civil Service (Medical): No    Lack of Transportation (Non-Medical): No  Physical Activity: Inactive (01/12/2018)   Exercise Vital Sign    Days of Exercise per Week: 0 days    Minutes of Exercise per Session: 0 min  Stress: Not on file  Social Connections: Unknown (01/12/2018)   Social Connection and Isolation Panel [NHANES]    Frequency of Communication with Friends and Family: More than three times a week    Frequency of Social Gatherings with Friends and Family: More than three times a week    Attends Religious Services: Not asked    Active Member of Clubs or Organizations: Not on file    Attends Banker Meetings:  Not on file    Marital Status: Not on file  Intimate Partner Violence: Not on file    Family History  Problem Relation Age of Onset   Kidney disease Mother    Kidney disease Brother    Kidney disease Brother    Kidney disease Brother     No Known Allergies  Outpatient Medications Prior to Visit  Medication Sig Note   aspirin  EC 81 MG tablet Take 1 tablet (81 mg total) by mouth daily. Swallow whole.    carvedilol  (COREG ) 3.125 MG tablet Take 1 tablet by mouth twice daily    Cholecalciferol (VITAMIN D3) 250 MCG (10000 UT) CAPS Take 1 capsule by mouth daily.    hydrocortisone  (ANUSOL -HC) 2.5 % rectal cream Place 1 Application rectally 2 (two) times daily.    rosuvastatin  (CRESTOR ) 40 MG tablet Take 1  tablet (40 mg total) by mouth daily.    [DISCONTINUED] ezetimibe  (ZETIA ) 10 MG tablet Take 1 tablet by mouth once daily (Patient not taking: Reported on 07/05/2023) 07/05/2023: palpitations   No facility-administered medications prior to visit.    Review of Systems  Constitutional: Negative.   HENT: Negative.    Eyes:        Left eye visual loss  Respiratory: Negative.    Cardiovascular: Negative.   Gastrointestinal: Negative.   Genitourinary: Negative.   Skin: Negative.   Neurological: Negative.   Endo/Heme/Allergies: Negative.        Objective:   BP 130/78   Pulse 77   Temp (!) 96.2 F (35.7 C)   Ht 6' 1.5" (1.867 m)   Wt 183 lb 9.6 oz (83.3 kg)   SpO2 96%   BMI 23.89 kg/m   Vitals:   07/05/23 1515  BP: 130/78  Pulse: 77  Temp: (!) 96.2 F (35.7 C)  Height: 6' 1.5" (1.867 m)  Weight: 183 lb 9.6 oz (83.3 kg)  SpO2: 96%  BMI (Calculated): 23.89    Physical Exam Vitals reviewed.  Constitutional:      Appearance: Normal appearance.  HENT:     Head: Normocephalic.     Left Ear: There is no impacted cerumen.     Nose: Nose normal.     Mouth/Throat:     Mouth: Mucous membranes are moist.     Pharynx: No posterior oropharyngeal erythema.  Eyes:     General: Lids are normal. Visual field deficit present.     Extraocular Movements: Extraocular movements intact.     Pupils: Pupils are equal, round, and reactive to light.     Comments: Opaque left cornea  Cardiovascular:     Rate and Rhythm: Regular rhythm.     Chest Wall: PMI is not displaced.     Pulses: Normal pulses.     Heart sounds: Normal heart sounds. No murmur heard. Pulmonary:     Effort: Pulmonary effort is normal.     Breath sounds: Normal air entry. No rhonchi or rales.  Abdominal:     General: Abdomen is flat. Bowel sounds are normal. There is no distension.     Palpations: Abdomen is soft. There is no hepatomegaly, splenomegaly or mass.     Tenderness: There is no abdominal tenderness.   Musculoskeletal:        General: Normal range of motion.     Cervical back: Normal range of motion and neck supple. No tenderness. Muscular tenderness (upper trap and right paracervical) present.     Right lower leg: No edema.     Left  lower leg: No edema.  Skin:    General: Skin is warm and dry.  Neurological:     General: No focal deficit present.     Mental Status: He is alert and oriented to person, place, and time.     Cranial Nerves: No cranial nerve deficit.     Motor: No weakness.     Deep Tendon Reflexes: Reflexes abnormal.     Comments: Hyporeflexic globally  Psychiatric:        Mood and Affect: Mood normal.        Behavior: Behavior normal.      No results found for any visits on 07/05/23.  Recent Results (from the past 2160 hours)  Hepatic function panel     Status: Abnormal   Collection Time: 06/29/23  8:36 AM  Result Value Ref Range   Total Protein 7.0 6.0 - 8.5 g/dL   Albumin 4.4 3.9 - 4.9 g/dL   Bilirubin Total 0.4 0.0 - 1.2 mg/dL   Bilirubin, Direct 1.61 0.00 - 0.40 mg/dL   Alkaline Phosphatase 153 (H) 44 - 121 IU/L   AST 31 0 - 40 IU/L   ALT 36 0 - 44 IU/L  CK     Status: None   Collection Time: 06/29/23  8:36 AM  Result Value Ref Range   Total CK 53 41 - 331 U/L  Lipid panel     Status: Abnormal   Collection Time: 06/29/23  8:36 AM  Result Value Ref Range   Cholesterol, Total 206 (H) 100 - 199 mg/dL   Triglycerides 096 0 - 149 mg/dL   HDL 77 >04 mg/dL   VLDL Cholesterol Cal 18 5 - 40 mg/dL   LDL Chol Calc (NIH) 540 (H) 0 - 99 mg/dL   Chol/HDL Ratio 2.7 0.0 - 5.0 ratio    Comment:                                   T. Chol/HDL Ratio                                             Men  Women                               1/2 Avg.Risk  3.4    3.3                                   Avg.Risk  5.0    4.4                                2X Avg.Risk  9.6    7.1                                3X Avg.Risk 23.4   11.0   CMP14+EGFR     Status: None    Collection Time: 06/29/23  8:36 AM  Result Value Ref Range   Glucose 91 70 - 99 mg/dL   BUN 11 8 - 27 mg/dL   Creatinine, Ser 9.81 0.76 -  1.27 mg/dL   eGFR 86 >16 XW/RUE/4.54   BUN/Creatinine Ratio 11 10 - 24   Sodium 139 134 - 144 mmol/L   Potassium 4.0 3.5 - 5.2 mmol/L   Chloride 102 96 - 106 mmol/L   CO2 22 20 - 29 mmol/L   Calcium  9.6 8.6 - 10.2 mg/dL   Total Protein 7.3 6.0 - 8.5 g/dL   Albumin 4.7 3.9 - 4.9 g/dL   Globulin, Total 2.6 1.5 - 4.5 g/dL   Bilirubin Total <0.9 0.0 - 1.2 mg/dL   Alkaline Phosphatase 65 44 - 121 IU/L   AST 34 0 - 40 IU/L   ALT 39 0 - 44 IU/L  Hemoglobin A1c     Status: Abnormal   Collection Time: 06/29/23  8:36 AM  Result Value Ref Range   Hgb A1c MFr Bld 5.8 (H) 4.8 - 5.6 %    Comment:          Prediabetes: 5.7 - 6.4          Diabetes: >6.4          Glycemic control for adults with diabetes: <7.0    Est. average glucose Bld gHb Est-mCnc 120 mg/dL      Assessment & Plan:  As per problem list  Problem List Items Addressed This Visit       Cardiovascular and Mediastinum   Hypertension - Primary   Relevant Orders   CBC With Diff/Platelet     Other   Mixed hyperlipidemia   Relevant Orders   Comprehensive metabolic panel with GFR   CK   Prediabetes    Return in about 3 months (around 10/04/2023) for cpe with labs prior.   Total time spent: 20 minutes  Arzella Bitters, MD  07/05/2023   This document may have been prepared by [facility] Voice Recognition software and as such may include unintentional dictation errors.

## 2023-07-27 ENCOUNTER — Ambulatory Visit (INDEPENDENT_AMBULATORY_CARE_PROVIDER_SITE_OTHER): Payer: Medicaid Other

## 2023-07-27 ENCOUNTER — Ambulatory Visit (INDEPENDENT_AMBULATORY_CARE_PROVIDER_SITE_OTHER): Payer: Medicaid Other | Admitting: Vascular Surgery

## 2023-07-27 ENCOUNTER — Encounter (INDEPENDENT_AMBULATORY_CARE_PROVIDER_SITE_OTHER): Payer: Self-pay | Admitting: Vascular Surgery

## 2023-07-27 VITALS — BP 128/83 | HR 74 | Resp 18 | Ht 73.0 in | Wt 183.0 lb

## 2023-07-27 DIAGNOSIS — I6521 Occlusion and stenosis of right carotid artery: Secondary | ICD-10-CM | POA: Diagnosis not present

## 2023-07-27 DIAGNOSIS — E782 Mixed hyperlipidemia: Secondary | ICD-10-CM | POA: Diagnosis not present

## 2023-07-27 DIAGNOSIS — I10 Essential (primary) hypertension: Secondary | ICD-10-CM | POA: Diagnosis not present

## 2023-07-27 NOTE — Assessment & Plan Note (Signed)
 Carotid duplex today reveals stable velocities in the upper end of the 40 to 59% range on the right and in the 1 to 39% range on the left.  No role for intervention, but I will continue to follow this on 28-month intervals given the velocities on the upper end of the 40 to 59% range on the right.  Continue aspirin  and statin agent.

## 2023-07-27 NOTE — Progress Notes (Signed)
 MRN : [id number]  Charles Lamb is a 63 y.o. ([birth year]) male who presents with chief complaint of  Chief Complaint  Patient presents with   Follow-up    fu 6 months + Carotid   .  History of Present Illness: Patient returns in follow-up of his carotid disease.  He is doing well today.  He continues to be asymptomatic from a cerebrovascular standpoint.Specifically, the patient denies amaurosis fugax, speech or swallowing difficulties, or arm or leg weakness or numbness. Carotid duplex today reveals stable velocities in the upper end of the 40 to 59% range on the right and in the 1 to 39% range on the left.    Current Outpatient Medications  Medication Sig Dispense Refill   aspirin  EC 81 MG tablet Take 1 tablet (81 mg total) by mouth daily. Swallow whole. 90 tablet 4   carvedilol  (COREG ) 3.125 MG tablet Take 1 tablet by mouth twice daily 180 tablet 2   Cholecalciferol (VITAMIN D3) 250 MCG (10000 UT) CAPS Take 1 capsule by mouth daily.     hydrocortisone  (ANUSOL -HC) 2.5 % rectal cream Place 1 Application rectally 2 (two) times daily. 28 g 0   rosuvastatin  (CRESTOR ) 40 MG tablet Take 1 tablet (40 mg total) by mouth daily. 90 tablet 1   No current facility-administered medications for this visit.    Past Medical History:  Diagnosis Date   Arthritis    Hemorrhoids    Hyperlipidemia    Hypertension    Renal disorder     Past Surgical History:  Procedure Laterality Date   COLONOSCOPY WITH PROPOFOL  N/A 02/07/2019   Procedure: COLONOSCOPY WITH PROPOFOL ;  Surgeon: Luke Salaam, MD;  Location: [facility] ENDOSCOPY;  Service: Gastroenterology;  Laterality: N/A;   EYE SURGERY Left    FLEXIBLE SIGMOIDOSCOPY N/A 04/30/2020   Procedure: FLEXIBLE SIGMOIDOSCOPY;  Surgeon: Luke Salaam, MD;  Location: [facility] ENDOSCOPY;  Service: Gastroenterology;  Laterality: N/A;   TOTAL SHOULDER ARTHROPLASTY Left 05/25/2018   Procedure: LEFT SHOULDER HEMIARTHROPLASTY, BICEPS TENODESIS;  Surgeon: Jerlyn Moons,  MD;  Location: [facility] ORS;  Service: Orthopedics;  Laterality: Left;   TOTAL SHOULDER REPLACEMENT Left      Social History   Tobacco Use   Smoking status: Every Day    Current packs/day: 0.25    Average packs/day: 0.3 packs/day for 45.0 years (11.3 ttl pk-yrs)    Types: Cigarettes   Smokeless tobacco: Never  Vaping Use   Vaping status: Never Used  Substance Use Topics   Alcohol use: Yes    Alcohol/week: 2.0 standard drinks of alcohol    Types: 2 Cans of beer per week    Comment: 1 40oz a day   Drug use: Never      Family History  Problem Relation Age of Onset   Kidney disease Mother    Kidney disease Brother    Kidney disease Brother    Kidney disease Brother      No Known Allergies   REVIEW OF SYSTEMS (Negative unless checked)  Constitutional: [] Weight loss  [] Fever  [] Chills Cardiac: [] Chest pain   [] Chest pressure   [] Palpitations   [] Shortness of breath when laying flat   [] Shortness of breath at rest   [] Shortness of breath with exertion. Vascular:  [] Pain in legs with walking   [] Pain in legs at rest   [] Pain in legs when laying flat   [] Claudication   [] Pain in feet when walking  [] Pain in feet at rest  [] Pain in feet when laying  flat   [] History of DVT   [] Phlebitis   [] Swelling in legs   [] Varicose veins   [] Non-healing ulcers Pulmonary:   [] Uses home oxygen   [] Productive cough   [] Hemoptysis   [] Wheeze  [] COPD   [] Asthma Neurologic:  [] Dizziness  [] Blackouts   [] Seizures   [] History of stroke   [] History of TIA  [] Aphasia   [] Temporary blindness   [] Dysphagia   [] Weakness or numbness in arms   [] Weakness or numbness in legs Musculoskeletal:  [x] Arthritis   [] Joint swelling   [] Joint pain   [] Low back pain Hematologic:  [] Easy bruising  [] Easy bleeding   [] Hypercoagulable state   [] Anemic  [] Hepatitis Gastrointestinal:  [] Blood in stool   [] Vomiting blood  [] Gastroesophageal reflux/heartburn   [] Difficulty swallowing. Genitourinary:  [x] Chronic kidney disease    [] Difficult urination  [] Frequent urination  [] Burning with urination   [] Blood in urine Skin:  [] Rashes   [] Ulcers   [] Wounds Psychological:  [] History of anxiety   []  History of major depression.  Physical Examination  Vitals:   07/27/23 1331  BP: 128/83  Pulse: 74  Resp: 18  Weight: 183 lb (83 kg)  Height: 6\' 1"  (1.854 m)   Body mass index is 24.14 kg/m. Gen:  WD/WN, NAD Head: [place]/AT, No temporalis wasting. Ear/Nose/Throat: Hearing grossly intact, nares w/o erythema or drainage, trachea midline Eyes: Conjunctiva clear. Sclera non-icteric Neck: Supple.  + right carotid bruit  Pulmonary:  Good air movement, equal and clear to auscultation bilaterally.  Cardiac: RRR, No JVD Vascular:  Vessel Right Left  Radial Palpable Palpable           Musculoskeletal: M/S 5/5 throughout.  No deformity or atrophy. No edema. Neurologic: CN 2-12 intact. Sensation grossly intact in extremities.  Symmetrical.  Speech is fluent. Motor exam as listed above. Psychiatric: Judgment intact, Mood & affect appropriate for pt's clinical situation. Dermatologic: No rashes or ulcers noted.  No cellulitis or open wounds. Lymph : No Cervical, Axillary, or Inguinal lymphadenopathy.    CBC Lab Results  Component Value Date   WBC 5.7 11/04/2022   HGB 14.0 11/04/2022   HCT 42.8 11/04/2022   MCV 95 11/04/2022   PLT 187 11/04/2022    BMET    Component Value Date/Time   NA 139 06/29/2023 0836   NA 142 03/30/2011 0523   K 4.0 06/29/2023 0836   K 4.2 03/30/2011 0523   CL 102 06/29/2023 0836   CL 106 03/30/2011 0523   CO2 22 06/29/2023 0836   CO2 26 03/30/2011 0523   GLUCOSE 91 06/29/2023 0836   GLUCOSE 121 (H) 05/26/2018 0330   GLUCOSE 93 03/30/2011 0523   BUN 11 06/29/2023 0836   BUN 10 03/30/2011 0523   CREATININE 1.00 06/29/2023 0836   CREATININE 1.02 03/30/2011 0523   CALCIUM  9.6 06/29/2023 0836   CALCIUM  9.5 03/30/2011 0523   GFRNONAA 73 10/26/2019 1357   GFRNONAA >60 03/30/2011 0523    GFRAA 84 10/26/2019 1357   GFRAA >60 03/30/2011 0523   CrCl cannot be calculated (Patient's most recent lab result is older than the maximum 21 days allowed.).  COAG Lab Results  Component Value Date   INR 1.0 05/18/2018    Radiology No results found.   Assessment/Plan Carotid stenosis, asymptomatic, right Carotid duplex today reveals stable velocities in the upper end of the 40 to 59% range on the right and in the 1 to 39% range on the left.  No role for intervention, but I will continue  to follow this on 24-month intervals given the velocities on the upper end of the 40 to 59% range on the right.  Continue aspirin  and statin agent.  Hypertension blood pressure control important in reducing the progression of atherosclerotic disease. On appropriate oral medications.     Mixed hyperlipidemia lipid control important in reducing the progression of atherosclerotic disease. Continue statin therapy  Mikki Alexander, MD  07/27/2023 1:52 PM    This note was created with Dragon medical transcription system.  Any errors from dictation are purely unintentional

## 2023-10-04 ENCOUNTER — Emergency Department

## 2023-10-04 ENCOUNTER — Other Ambulatory Visit: Payer: Self-pay

## 2023-10-04 ENCOUNTER — Encounter: Admitting: Internal Medicine

## 2023-10-04 ENCOUNTER — Emergency Department
Admission: EM | Admit: 2023-10-04 | Discharge: 2023-10-04 | Disposition: A | Discharge status: Home / Self Care | Attending: Emergency Medicine | Admitting: Emergency Medicine

## 2023-10-04 DIAGNOSIS — Y92410 Unspecified street and highway as the place of occurrence of the external cause: Secondary | ICD-10-CM | POA: Insufficient documentation

## 2023-10-04 DIAGNOSIS — S4991XA Unspecified injury of right shoulder and upper arm, initial encounter: Secondary | ICD-10-CM | POA: Diagnosis not present

## 2023-10-04 DIAGNOSIS — I10 Essential (primary) hypertension: Secondary | ICD-10-CM | POA: Insufficient documentation

## 2023-10-04 NOTE — Discharge Instructions (Addendum)
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
## 2023-10-05 ENCOUNTER — Other Ambulatory Visit

## 2023-10-05 DIAGNOSIS — I10 Essential (primary) hypertension: Secondary | ICD-10-CM | POA: Diagnosis not present

## 2023-10-05 DIAGNOSIS — R7303 Prediabetes: Secondary | ICD-10-CM | POA: Diagnosis not present

## 2023-10-05 DIAGNOSIS — E782 Mixed hyperlipidemia: Secondary | ICD-10-CM | POA: Diagnosis not present

## 2023-10-06 LAB — COMPREHENSIVE METABOLIC PANEL WITH GFR
ALT: 47 IU/L — ABNORMAL HIGH (ref 0–44)
AST: 45 IU/L — ABNORMAL HIGH (ref 0–40)
Albumin: 4.3 g/dL (ref 3.9–4.9)
Alkaline Phosphatase: 59 IU/L (ref 44–121)
BUN/Creatinine Ratio: 9 — ABNORMAL LOW (ref 10–24)
BUN: 9 mg/dL (ref 8–27)
Bilirubin Total: 0.4 mg/dL (ref 0.0–1.2)
CO2: 20 mmol/L (ref 20–29)
Calcium: 9.4 mg/dL (ref 8.6–10.2)
Chloride: 104 mmol/L (ref 96–106)
Creatinine, Ser: 0.97 mg/dL (ref 0.76–1.27)
Globulin, Total: 2.6 g/dL (ref 1.5–4.5)
Glucose: 109 mg/dL — ABNORMAL HIGH (ref 70–99)
Potassium: 3.9 mmol/L (ref 3.5–5.2)
Sodium: 142 mmol/L (ref 134–144)
Total Protein: 6.9 g/dL (ref 6.0–8.5)
eGFR: 89 mL/min/1.73 (ref >59)

## 2023-10-06 LAB — CBC WITH DIFF/PLATELET
Basophils Absolute: 0 x10E3/uL (ref 0.0–0.2)
Basos: 1 %
EOS (ABSOLUTE): 0.1 x10E3/uL (ref 0.0–0.4)
Eos: 1 %
Hematocrit: 41.3 % (ref 37.5–51.0)
Hemoglobin: 13.6 g/dL (ref 13.0–17.7)
Immature Grans (Abs): 0 x10E3/uL (ref 0.0–0.1)
Immature Granulocytes: 0 %
Lymphocytes Absolute: 1.6 x10E3/uL (ref 0.7–3.1)
Lymphs: 27 %
MCH: 31.4 pg (ref 26.6–33.0)
MCHC: 32.9 g/dL (ref 31.5–35.7)
MCV: 95 fL (ref 79–97)
Monocytes Absolute: 0.5 x10E3/uL (ref 0.1–0.9)
Monocytes: 9 %
Neutrophils Absolute: 3.6 x10E3/uL (ref 1.4–7.0)
Neutrophils: 62 %
Platelets: 180 x10E3/uL (ref 150–450)
RBC: 4.33 x10E6/uL (ref 4.14–5.80)
RDW: 13 % (ref 11.6–15.4)
WBC: 5.9 x10E3/uL (ref 3.4–10.8)

## 2023-10-07 ENCOUNTER — Other Ambulatory Visit: Payer: Self-pay | Admitting: Internal Medicine

## 2023-10-11 ENCOUNTER — Ambulatory Visit: Payer: Self-pay | Admitting: Internal Medicine

## 2023-10-11 ENCOUNTER — Encounter: Payer: Self-pay | Admitting: Internal Medicine

## 2023-10-11 ENCOUNTER — Ambulatory Visit (INDEPENDENT_AMBULATORY_CARE_PROVIDER_SITE_OTHER): Admitting: Internal Medicine

## 2023-10-11 VITALS — BP 122/74 | HR 84 | Temp 98.0°F | Ht 73.0 in | Wt 176.4 lb

## 2023-10-11 DIAGNOSIS — E782 Mixed hyperlipidemia: Secondary | ICD-10-CM | POA: Diagnosis not present

## 2023-10-11 DIAGNOSIS — Z0001 Encounter for general adult medical examination with abnormal findings: Secondary | ICD-10-CM

## 2023-10-11 DIAGNOSIS — R7303 Prediabetes: Secondary | ICD-10-CM | POA: Diagnosis not present

## 2023-10-11 DIAGNOSIS — I10 Essential (primary) hypertension: Secondary | ICD-10-CM

## 2023-10-11 NOTE — Progress Notes (Signed)
 Established Patient Office Visit  Subjective:  Patient ID: Charles Lamb, male    DOB: [birth year]  Age: 63 y.o. MRN: [id number]  Chief Complaint  Patient presents with   Follow-up    CPE lab results    No new complaints, here for lab review and medication refills. Labs reviewed and notable for elevated transaminases to which he admits recent heavy etoh use.     No other concerns at this time.   Past Medical History:  Diagnosis Date   Arthritis    Hemorrhoids    Hyperlipidemia    Hypertension    Renal disorder     Past Surgical History:  Procedure Laterality Date   COLONOSCOPY WITH PROPOFOL  N/A 02/07/2019   Procedure: COLONOSCOPY WITH PROPOFOL ;  Surgeon: Therisa Bi, MD;  Location: [facility] ENDOSCOPY;  Service: Gastroenterology;  Laterality: N/A;   EYE SURGERY Left    FLEXIBLE SIGMOIDOSCOPY N/A 04/30/2020   Procedure: FLEXIBLE SIGMOIDOSCOPY;  Surgeon: Therisa Bi, MD;  Location: [facility] ENDOSCOPY;  Service: Gastroenterology;  Laterality: N/A;   TOTAL SHOULDER ARTHROPLASTY Left 05/25/2018   Procedure: LEFT SHOULDER HEMIARTHROPLASTY, BICEPS TENODESIS;  Surgeon: Leora Lynwood SAUNDERS, MD;  Location: [facility] ORS;  Service: Orthopedics;  Laterality: Left;   TOTAL SHOULDER REPLACEMENT Left     Social History   Socioeconomic History   Marital status: Single    Spouse name: Not on file   Number of children: 0   Years of education: Not on file   Highest education level: High school graduate  Occupational History   Occupation: disability    Comment: Former Hospital doctor  Tobacco Use   Smoking status: Every Day    Current packs/day: 0.25    Average packs/day: 0.3 packs/day for 45.0 years (11.3 ttl pk-yrs)    Types: Cigarettes   Smokeless tobacco: Never  Vaping Use   Vaping status: Never Used  Substance and Sexual Activity   Alcohol use: Yes    Alcohol/week: 7.0 standard drinks of alcohol    Types: 7 Cans of beer per week    Comment: 1 40oz a day   Drug use: Never   Sexual activity: Not  on file  Other Topics Concern   Not on file  Social History Narrative   ** Merged History Encounter **       Social Drivers of Health   Financial Resource Strain: Medium Risk (12/18/2021)   Overall Financial Resource Strain (CARDIA)    Difficulty of Paying Living Expenses: Somewhat hard  Food Insecurity: No Food Insecurity (12/18/2021)   Hunger Vital Sign    Worried About Running Out of Food in the Last Year: Never true    Ran Out of Food in the Last Year: Never true  Transportation Needs: No Transportation Needs (12/18/2021)   PRAPARE - Administrator, Civil Service (Medical): No    Lack of Transportation (Non-Medical): No  Physical Activity: Inactive (01/12/2018)   Exercise Vital Sign    Days of Exercise per Week: 0 days    Minutes of Exercise per Session: 0 min  Stress: Not on file  Social Connections: Unknown (01/12/2018)   Social Connection and Isolation Panel    Frequency of Communication with Friends and Family: More than three times a week    Frequency of Social Gatherings with Friends and Family: More than three times a week    Attends Religious Services: Not asked    Active Member of Clubs or Organizations: Not on file    Attends Banker  Meetings: Not on file    Marital Status: Not on file  Intimate Partner Violence: Not on file    Family History  Problem Relation Age of Onset   Kidney disease Mother    Stomach cancer Sister    Kidney disease Brother    Kidney disease Brother    Kidney disease Brother     No Known Allergies  Outpatient Medications Prior to Visit  Medication Sig   aspirin  EC 81 MG tablet Take 1 tablet (81 mg total) by mouth daily. Swallow whole.   carvedilol  (COREG ) 3.125 MG tablet Take 1 tablet by mouth twice daily   Cholecalciferol (VITAMIN D3) 250 MCG (10000 UT) CAPS Take 1 capsule by mouth daily.   hydrocortisone  (ANUSOL -HC) 2.5 % rectal cream Place 1 Application rectally 2 (two) times daily.   rosuvastatin   (CRESTOR ) 40 MG tablet Take 1 tablet by mouth once daily   No facility-administered medications prior to visit.    Review of Systems  Constitutional: Negative.   HENT: Negative.    Eyes:        Left eye visual loss  Respiratory: Negative.    Cardiovascular: Negative.   Gastrointestinal: Negative.   Genitourinary: Negative.   Skin: Negative.   Neurological: Negative.   Endo/Heme/Allergies: Negative.        Objective:   BP 122/74   Pulse 84   Temp 98 F (36.7 C)   Ht 6' 1 (1.854 m)   Wt 176 lb 6.4 oz (80 kg)   SpO2 98%   BMI 23.27 kg/m   Vitals:   10/11/23 1504  BP: 122/74  Pulse: 84  Temp: 98 F (36.7 C)  Height: 6' 1 (1.854 m)  Weight: 176 lb 6.4 oz (80 kg)  SpO2: 98%  BMI (Calculated): 23.28    Physical Exam Vitals reviewed.  Constitutional:      Appearance: Normal appearance.  HENT:     Head: Normocephalic.     Left Ear: There is no impacted cerumen.     Nose: Nose normal.     Mouth/Throat:     Mouth: Mucous membranes are moist.     Pharynx: No posterior oropharyngeal erythema.  Eyes:     General: Lids are normal. Visual field deficit present.     Extraocular Movements: Extraocular movements intact.     Pupils: Pupils are equal, round, and reactive to light.     Comments: Opaque left cornea  Cardiovascular:     Rate and Rhythm: Regular rhythm.     Chest Wall: PMI is not displaced.     Pulses: Normal pulses.     Heart sounds: Normal heart sounds. No murmur heard. Pulmonary:     Effort: Pulmonary effort is normal.     Breath sounds: Normal air entry. No rhonchi or rales.  Abdominal:     General: Abdomen is flat. Bowel sounds are normal. There is no distension.     Palpations: Abdomen is soft. There is no hepatomegaly, splenomegaly or mass.     Tenderness: There is no abdominal tenderness.  Musculoskeletal:        General: Normal range of motion.     Cervical back: Normal range of motion and neck supple. No tenderness. Muscular tenderness  (upper trap and right paracervical) present.     Right lower leg: No edema.     Left lower leg: No edema.  Skin:    General: Skin is warm and dry.  Neurological:     General: No focal deficit present.  Mental Status: He is alert and oriented to person, place, and time.     Cranial Nerves: No cranial nerve deficit.     Motor: No weakness.     Deep Tendon Reflexes: Reflexes abnormal.     Comments: Hyporeflexic globally  Psychiatric:        Mood and Affect: Mood normal.        Behavior: Behavior normal.      No results found for any visits on 10/11/23.  Recent Results (from the past 2160 hours)  Comprehensive metabolic panel with GFR     Status: Abnormal   Collection Time: 10/05/23  9:09 AM  Result Value Ref Range   Glucose 109 (H) 70 - 99 mg/dL   BUN 9 8 - 27 mg/dL   Creatinine, Ser 9.02 0.76 - 1.27 mg/dL   eGFR 89 >40 fO/fpw/8.26   BUN/Creatinine Ratio 9 (L) 10 - 24   Sodium 142 134 - 144 mmol/L   Potassium 3.9 3.5 - 5.2 mmol/L   Chloride 104 96 - 106 mmol/L   CO2 20 20 - 29 mmol/L   Calcium  9.4 8.6 - 10.2 mg/dL   Total Protein 6.9 6.0 - 8.5 g/dL   Albumin 4.3 3.9 - 4.9 g/dL   Globulin, Total 2.6 1.5 - 4.5 g/dL   Bilirubin Total 0.4 0.0 - 1.2 mg/dL   Alkaline Phosphatase 59 44 - 121 IU/L   AST 45 (H) 0 - 40 IU/L   ALT 47 (H) 0 - 44 IU/L  CBC With Diff/Platelet     Status: None   Collection Time: 10/05/23  9:09 AM  Result Value Ref Range   WBC 5.9 3.4 - 10.8 x10E3/uL   RBC 4.33 4.14 - 5.80 x10E6/uL   Hemoglobin 13.6 13.0 - 17.7 g/dL   Hematocrit 58.6 62.4 - 51.0 %   MCV 95 79 - 97 fL   MCH 31.4 26.6 - 33.0 pg   MCHC 32.9 31.5 - 35.7 g/dL   RDW 86.9 88.3 - 84.5 %   Platelets 180 150 - 450 x10E3/uL   Neutrophils 62 Not Estab. %   Lymphs 27 Not Estab. %   Monocytes 9 Not Estab. %   Eos 1 Not Estab. %   Basos 1 Not Estab. %   Neutrophils Absolute 3.6 1.4 - 7.0 x10E3/uL   Lymphocytes Absolute 1.6 0.7 - 3.1 x10E3/uL   Monocytes Absolute 0.5 0.1 - 0.9 x10E3/uL    EOS (ABSOLUTE) 0.1 0.0 - 0.4 x10E3/uL   Basophils Absolute 0.0 0.0 - 0.2 x10E3/uL   Immature Granulocytes 0 Not Estab. %   Immature Grans (Abs) 0.0 0.0 - 0.1 x10E3/uL      Assessment & Plan:  As per problem list. Labs released:  lipids  Problem List Items Addressed This Visit       Cardiovascular and Mediastinum   Hypertension - Primary     Other   Mixed hyperlipidemia   Relevant Orders   Lipid panel   Prediabetes   Relevant Orders   Hemoglobin A1c    Return in about 3 months (around 01/11/2024) for fu with labs prior.   Total time spent: 30 minutes  Sherrill Cinderella Perry, MD  10/11/2023   This document may have been prepared by [facility] Voice Recognition software and as such may include unintentional dictation errors.

## 2023-10-13 LAB — LIPID PANEL
Chol/HDL Ratio: 2.5 ratio (ref 0.0–5.0)
Cholesterol, Total: 184 mg/dL (ref 100–199)
HDL: 75 mg/dL (ref >39)
LDL Chol Calc (NIH): 90 mg/dL (ref 0–99)
Triglycerides: 111 mg/dL (ref 0–149)
VLDL Cholesterol Cal: 19 mg/dL (ref 5–40)

## 2023-10-13 LAB — SPECIMEN STATUS REPORT

## 2023-10-13 LAB — HEMOGLOBIN A1C
Est. average glucose Bld gHb Est-mCnc: 123 mg/dL
Hgb A1c MFr Bld: 5.9 % — ABNORMAL HIGH (ref 4.8–5.6)

## 2023-11-23 DIAGNOSIS — S46911A Strain of unspecified muscle, fascia and tendon at shoulder and upper arm level, right arm, initial encounter: Secondary | ICD-10-CM | POA: Diagnosis not present

## 2023-12-07 DIAGNOSIS — M75121 Complete rotator cuff tear or rupture of right shoulder, not specified as traumatic: Secondary | ICD-10-CM | POA: Diagnosis not present

## 2023-12-07 DIAGNOSIS — M62511 Muscle wasting and atrophy, not elsewhere classified, right shoulder: Secondary | ICD-10-CM | POA: Diagnosis not present

## 2023-12-07 DIAGNOSIS — M7551 Bursitis of right shoulder: Secondary | ICD-10-CM | POA: Diagnosis not present

## 2023-12-07 DIAGNOSIS — S46911A Strain of unspecified muscle, fascia and tendon at shoulder and upper arm level, right arm, initial encounter: Secondary | ICD-10-CM | POA: Diagnosis not present

## 2023-12-10 DIAGNOSIS — S46911D Strain of unspecified muscle, fascia and tendon at shoulder and upper arm level, right arm, subsequent encounter: Secondary | ICD-10-CM | POA: Diagnosis not present

## 2023-12-10 DIAGNOSIS — Z96612 Presence of left artificial shoulder joint: Secondary | ICD-10-CM | POA: Diagnosis not present

## 2023-12-22 NOTE — Progress Notes (Deleted)
 Cardiology Office Note    Date:  12/22/2023   ID:  Charles Lamb, DOB 08/24/1960, MRN [id number]  PCP:  Albina GORMAN Dine, MD  Cardiologist:  Deatrice Cage, MD  Electrophysiologist:  None   Chief Complaint: Follow-up  History of Present Illness:   Charles Lamb is a 63 y.o. male with history of coronary artery calcification noted on CT imaging, abnormal EKG with LVH, carotid artery disease, HLD, and tobacco use who presents for follow-up of coronary artery calcification.   ***   Labs independently reviewed: 09/2023 - A1c 5.9, TC 184, TG 111, HDL 75, LDL 90, Hgb 13.6, PLT 180, BUN 9, serum creatinine 0.97, potassium 3.9, albumin 4.3, AST 45, ALT 47 07/2022 - TSH normal  Past Medical History:  Diagnosis Date   Arthritis    Hemorrhoids    Hyperlipidemia    Hypertension    Renal disorder     Past Surgical History:  Procedure Laterality Date   COLONOSCOPY WITH PROPOFOL  N/A 02/07/2019   Procedure: COLONOSCOPY WITH PROPOFOL ;  Surgeon: Therisa Bi, MD;  Location: [facility] ENDOSCOPY;  Service: Gastroenterology;  Laterality: N/A;   EYE SURGERY Left    FLEXIBLE SIGMOIDOSCOPY N/A 04/30/2020   Procedure: FLEXIBLE SIGMOIDOSCOPY;  Surgeon: Therisa Bi, MD;  Location: [facility] ENDOSCOPY;  Service: Gastroenterology;  Laterality: N/A;   TOTAL SHOULDER ARTHROPLASTY Left 05/25/2018   Procedure: LEFT SHOULDER HEMIARTHROPLASTY, BICEPS TENODESIS;  Surgeon: Leora Lynwood SAUNDERS, MD;  Location: [facility] ORS;  Service: Orthopedics;  Laterality: Left;   TOTAL SHOULDER REPLACEMENT Left     Current Medications: No outpatient medications have been marked as taking for the 12/24/23 encounter (Appointment) with Abigail Bernardino HERO, PA-C.    Allergies:   Patient has no known allergies.   Social History   Socioeconomic History   Marital status: Single    Spouse name: Not on file   Number of children: 0   Years of education: Not on file   Highest education level: High school graduate  Occupational History    Occupation: disability    Comment: Former Hospital doctor  Tobacco Use   Smoking status: Every Day    Current packs/day: 0.25    Average packs/day: 0.3 packs/day for 45.0 years (11.3 ttl pk-yrs)    Types: Cigarettes   Smokeless tobacco: Never  Vaping Use   Vaping status: Never Used  Substance and Sexual Activity   Alcohol use: Yes    Alcohol/week: 7.0 standard drinks of alcohol    Types: 7 Cans of beer per week    Comment: 1 40oz a day   Drug use: Never   Sexual activity: Not on file  Other Topics Concern   Not on file  Social History Narrative   ** Merged History Encounter **       Social Drivers of Health   Financial Resource Strain: Medium Risk (12/18/2021)   Overall Financial Resource Strain (CARDIA)    Difficulty of Paying Living Expenses: Somewhat hard  Food Insecurity: No Food Insecurity (12/18/2021)   Hunger Vital Sign    Worried About Running Out of Food in the Last Year: Never true    Ran Out of Food in the Last Year: Never true  Transportation Needs: No Transportation Needs (12/18/2021)   PRAPARE - Administrator, Civil Service (Medical): No    Lack of Transportation (Non-Medical): No  Physical Activity: Inactive (01/12/2018)   Exercise Vital Sign    Days of Exercise per Week: 0 days    Minutes of Exercise  per Session: 0 min  Stress: Not on file  Social Connections: Unknown (01/12/2018)   Social Connection and Isolation Panel    Frequency of Communication with Friends and Family: More than three times a week    Frequency of Social Gatherings with Friends and Family: More than three times a week    Attends Religious Services: Not asked    Active Member of Clubs or Organizations: Not on file    Attends Banker Meetings: Not on file    Marital Status: Not on file     Family History:  The patient's family history includes Kidney disease in his brother, brother, brother, and mother; Stomach cancer in his sister.  ROS:   12-point review of systems  is negative unless otherwise noted in the HPI.   EKGs/Labs/Other Studies Reviewed:    Studies reviewed were summarized above. The additional studies were reviewed today:  2D echo 06/04/2022: 1. Left ventricular ejection fraction, by estimation, is 60 to 65%. The  left ventricle has normal function. The left ventricle has no regional  wall motion abnormalities. There is moderate left ventricular hypertrophy.  Left ventricular diastolic  parameters are consistent with Grade I diastolic dysfunction (impaired  relaxation). The average left ventricular global longitudinal strain is  -17.7 %.   2. Right ventricular systolic function is normal. The right ventricular  size is normal.   3. The mitral valve is normal in structure. Mild mitral valve  regurgitation. No evidence of mitral stenosis.   4. The aortic valve is normal in structure. There is mild calcification  of the aortic valve. Aortic valve regurgitation is not visualized. Aortic  valve sclerosis is present, with no evidence of aortic valve stenosis.   5. The inferior vena cava is normal in size with greater than 50%  respiratory variability, suggesting right atrial pressure of 3 mmHg.  __________   Lexiscan  MPI 05/04/2022:   The study is normal. The study is low risk.   ST elevation (V2, V3 and V4) was noted. this is chronic.   LV perfusion is normal. There is no evidence of ischemia. There is no evidence of infarction.   Left ventricular function is normal. End diastolic cavity size is normal. End systolic cavity size is normal.   CT attenuation images showed moderate coronary calcification.   EKG:  EKG is ordered today.  The EKG ordered today demonstrates ***  Recent Labs: 10/05/2023: ALT 47; BUN 9; Creatinine, Ser 0.97; Hemoglobin 13.6; Platelets 180; Potassium 3.9; Sodium 142  Recent Lipid Panel    Component Value Date/Time   CHOL 184 10/05/2023 0909   TRIG 111 10/05/2023 0909   HDL 75 10/05/2023 0909   CHOLHDL 2.5  10/05/2023 0909   LDLCALC 90 10/05/2023 0909    PHYSICAL EXAM:    VS:  There were no vitals taken for this visit.  BMI: There is no height or weight on file to calculate BMI.  Physical Exam  Wt Readings from Last 3 Encounters:  10/11/23 176 lb 6.4 oz (80 kg)  10/04/23 180 lb (81.6 kg)  07/27/23 183 lb (83 kg)     ASSESSMENT & PLAN:   Abnormal EKG/LVH:  Coronary artery calcification/HLD: ***.  LDL  Carotid artery disease: Followed by the surgery.  Carotid artery ultrasound in 07/2023 demonstrated 40 to 59% right ICA stenosis with 1 to 39% left ICA stenosis.  Tobacco use:   {Are you ordering a CV Procedure (e.g. stress test, cath, DCCV, TEE, etc)?   Press F2        :  [id number]}     Disposition: F/u with Dr. Darron or an APP in ***.   Medication Adjustments/Labs and Tests Ordered: Current medicines are reviewed at length with the patient today.  Concerns regarding medicines are outlined above. Medication changes, Labs and Tests ordered today are summarized above and listed in the Patient Instructions accessible in Encounters.   Signed, Bernardino Bring, PA-C 12/22/2023 9:31 AM     [facility] [address] [place], [place] [address] [phone]

## 2023-12-23 NOTE — Progress Notes (Unsigned)
 Cardiology Office Note    Date:  12/23/2023   ID:  Charles Lamb, DOB 1960/12/06, MRN [id number]  PCP:  Albina GORMAN Dine, MD  Cardiologist:  Deatrice Cage, MD  Electrophysiologist:  None   Chief Complaint: Follow-up  History of Present Illness:   Charles Lamb is a 63 y.o. male with history of coronary artery calcification noted on CT imaging, abnormal EKG with LVH, carotid artery disease, HLD, and tobacco use who presents for follow-up of coronary artery calcification.   Carotid artery ultrasound in 03/2022 showed 50 to 69% right ICA stenosis with minimal left ICA stenosis.  In this setting, PCP referred him to vascular surgery with recommendation to recheck vascular imaging in the fall 2024 through their office.   He was evaluated by Dr. Cage as inpatient in 04/2022 at the request of his PCP for longstanding history of abnormal EKGs with anterior lateral ST elevation dating back to approximately 10 years ago.  At that time, he reported exertional dyspnea without frank chest pain, palpitations, syncope, or symptoms of cardiac decompensation.  He noted no family history of CAD, sudden death, or arrhythmia.  His EKG was felt to be suggestive of hypertrophic cardiomyopathy.  Lexiscan  MPI in 04/2022 showed no evidence of ischemia or infarction and was overall low risk.  CT attenuation corrected images showed moderate coronary artery calcification.  Echo in 05/2022 demonstrated an EF of 60 to 65%, no regional wall motion abnormalities, moderate LVH, grade 1 diastolic dysfunction, normal RV systolic function and ventricular cavity size, mild mitral regurgitation, aortic valve sclerosis without evidence of stenosis, and an estimated right atrial pressure of 3 mmHg.  He has deferred cMRI in follow up visits.   ***   Labs independently reviewed: 09/2023 - A1c 5.9, TC 184, TG 111, HDL 75, LDL 90, Hgb 13.6, PLT 180, BUN 9, serum creatinine 0.97, potassium 3.9, albumin 4.3, AST 45, ALT 47 07/2022  - TSH normal  Past Medical History:  Diagnosis Date   Arthritis    Hemorrhoids    Hyperlipidemia    Hypertension    Renal disorder     Past Surgical History:  Procedure Laterality Date   COLONOSCOPY WITH PROPOFOL  N/A 02/07/2019   Procedure: COLONOSCOPY WITH PROPOFOL ;  Surgeon: Therisa Bi, MD;  Location: [facility] ENDOSCOPY;  Service: Gastroenterology;  Laterality: N/A;   EYE SURGERY Left    FLEXIBLE SIGMOIDOSCOPY N/A 04/30/2020   Procedure: FLEXIBLE SIGMOIDOSCOPY;  Surgeon: Therisa Bi, MD;  Location: [facility] ENDOSCOPY;  Service: Gastroenterology;  Laterality: N/A;   TOTAL SHOULDER ARTHROPLASTY Left 05/25/2018   Procedure: LEFT SHOULDER HEMIARTHROPLASTY, BICEPS TENODESIS;  Surgeon: Leora Lynwood SAUNDERS, MD;  Location: [facility] ORS;  Service: Orthopedics;  Laterality: Left;   TOTAL SHOULDER REPLACEMENT Left     Current Medications: No outpatient medications have been marked as taking for the 12/24/23 encounter (Appointment) with Abigail Bernardino HERO, PA-C.    Allergies:   Patient has no known allergies.   Social History   Socioeconomic History   Marital status: Single    Spouse name: Not on file   Number of children: 0   Years of education: Not on file   Highest education level: High school graduate  Occupational History   Occupation: disability    Comment: Former Hospital doctor  Tobacco Use   Smoking status: Every Day    Current packs/day: 0.25    Average packs/day: 0.3 packs/day for 45.0 years (11.3 ttl pk-yrs)    Types: Cigarettes   Smokeless tobacco: Never  Vaping  Use   Vaping status: Never Used  Substance and Sexual Activity   Alcohol use: Yes    Alcohol/week: 7.0 standard drinks of alcohol    Types: 7 Cans of beer per week    Comment: 1 40oz a day   Drug use: Never   Sexual activity: Not on file  Other Topics Concern   Not on file  Social History Narrative   ** Merged History Encounter **       Social Drivers of Health   Financial Resource Strain: Medium Risk (12/18/2021)   Overall  Financial Resource Strain (CARDIA)    Difficulty of Paying Living Expenses: Somewhat hard  Food Insecurity: No Food Insecurity (12/18/2021)   Hunger Vital Sign    Worried About Running Out of Food in the Last Year: Never true    Ran Out of Food in the Last Year: Never true  Transportation Needs: No Transportation Needs (12/18/2021)   PRAPARE - Administrator, Civil Service (Medical): No    Lack of Transportation (Non-Medical): No  Physical Activity: Inactive (01/12/2018)   Exercise Vital Sign    Days of Exercise per Week: 0 days    Minutes of Exercise per Session: 0 min  Stress: Not on file  Social Connections: Unknown (01/12/2018)   Social Connection and Isolation Panel    Frequency of Communication with Friends and Family: More than three times a week    Frequency of Social Gatherings with Friends and Family: More than three times a week    Attends Religious Services: Not asked    Active Member of Clubs or Organizations: Not on file    Attends Banker Meetings: Not on file    Marital Status: Not on file     Family History:  The patient's family history includes Kidney disease in his brother, brother, brother, and mother; Stomach cancer in his sister.  ROS:   12-point review of systems is negative unless otherwise noted in the HPI.   EKGs/Labs/Other Studies Reviewed:    Studies reviewed were summarized above. The additional studies were reviewed today:  2D echo 06/04/2022: 1. Left ventricular ejection fraction, by estimation, is 60 to 65%. The  left ventricle has normal function. The left ventricle has no regional  wall motion abnormalities. There is moderate left ventricular hypertrophy.  Left ventricular diastolic  parameters are consistent with Grade I diastolic dysfunction (impaired  relaxation). The average left ventricular global longitudinal strain is  -17.7 %.   2. Right ventricular systolic function is normal. The right ventricular  size is  normal.   3. The mitral valve is normal in structure. Mild mitral valve  regurgitation. No evidence of mitral stenosis.   4. The aortic valve is normal in structure. There is mild calcification  of the aortic valve. Aortic valve regurgitation is not visualized. Aortic  valve sclerosis is present, with no evidence of aortic valve stenosis.   5. The inferior vena cava is normal in size with greater than 50%  respiratory variability, suggesting right atrial pressure of 3 mmHg.  __________   Lexiscan  MPI 05/04/2022:   The study is normal. The study is low risk.   ST elevation (V2, V3 and V4) was noted. this is chronic.   LV perfusion is normal. There is no evidence of ischemia. There is no evidence of infarction.   Left ventricular function is normal. End diastolic cavity size is normal. End systolic cavity size is normal.   CT attenuation images showed moderate  coronary calcification.   EKG:  EKG is ordered today.  The EKG ordered today demonstrates ***  Recent Labs: 10/05/2023: ALT 47; BUN 9; Creatinine, Ser 0.97; Hemoglobin 13.6; Platelets 180; Potassium 3.9; Sodium 142  Recent Lipid Panel    Component Value Date/Time   CHOL 184 10/05/2023 0909   TRIG 111 10/05/2023 0909   HDL 75 10/05/2023 0909   CHOLHDL 2.5 10/05/2023 0909   LDLCALC 90 10/05/2023 0909    PHYSICAL EXAM:    VS:  There were no vitals taken for this visit.  BMI: There is no height or weight on file to calculate BMI.  Physical Exam  Wt Readings from Last 3 Encounters:  10/11/23 176 lb 6.4 oz (80 kg)  10/04/23 180 lb (81.6 kg)  07/27/23 183 lb (83 kg)     ASSESSMENT & PLAN:   Abnormal EKG/LVH:  Coronary artery calcification/HLD: ***.  LDL 90 in 09/2023.  Carotid artery disease: Followed by the surgery.  Carotid artery ultrasound in 07/2023 demonstrated 40 to 59% right ICA stenosis with 1 to 39% left ICA stenosis.  Tobacco use:   {Are you ordering a CV Procedure (e.g. stress test, cath, DCCV, TEE, etc)?    Press F2        :[id number]}     Disposition: F/u with Dr. Darron or an APP in ***.   Medication Adjustments/Labs and Tests Ordered: Current medicines are reviewed at length with the patient today.  Concerns regarding medicines are outlined above. Medication changes, Labs and Tests ordered today are summarized above and listed in the Patient Instructions accessible in Encounters.   Signed, Bernardino Bring, PA-C 12/23/2023 1:10 PM     [facility] [address] [place], [place] [address] [phone]

## 2023-12-24 ENCOUNTER — Ambulatory Visit: Attending: Physician Assistant | Admitting: Physician Assistant

## 2023-12-24 ENCOUNTER — Encounter: Payer: Self-pay | Admitting: Physician Assistant

## 2023-12-24 ENCOUNTER — Ambulatory Visit: Admitting: Physician Assistant

## 2023-12-24 VITALS — BP 146/87 | HR 81 | Ht 73.0 in | Wt 180.0 lb

## 2023-12-24 DIAGNOSIS — I779 Disorder of arteries and arterioles, unspecified: Secondary | ICD-10-CM | POA: Insufficient documentation

## 2023-12-24 DIAGNOSIS — R9431 Abnormal electrocardiogram [ECG] [EKG]: Secondary | ICD-10-CM | POA: Insufficient documentation

## 2023-12-24 DIAGNOSIS — I2510 Atherosclerotic heart disease of native coronary artery without angina pectoris: Secondary | ICD-10-CM | POA: Insufficient documentation

## 2023-12-24 DIAGNOSIS — Z79899 Other long term (current) drug therapy: Secondary | ICD-10-CM | POA: Diagnosis not present

## 2023-12-24 DIAGNOSIS — E785 Hyperlipidemia, unspecified: Secondary | ICD-10-CM | POA: Insufficient documentation

## 2023-12-24 DIAGNOSIS — I10 Essential (primary) hypertension: Secondary | ICD-10-CM | POA: Insufficient documentation

## 2023-12-24 DIAGNOSIS — Z720 Tobacco use: Secondary | ICD-10-CM | POA: Diagnosis not present

## 2023-12-24 DIAGNOSIS — I517 Cardiomegaly: Secondary | ICD-10-CM | POA: Insufficient documentation

## 2023-12-24 MED ORDER — EZETIMIBE 10 MG PO TABS: 10.0000 mg | ORAL_TABLET | Freq: Every day | ORAL | 3 refills | Status: AC

## 2023-12-24 NOTE — Patient Instructions (Signed)
 Medication Instructions:  Your physician recommends the following medication changes.  START TAKING: Zetia  10 mg daily  *If you need a refill on your cardiac medications before your next appointment, please call your pharmacy*  Lab Work: Your provider would like for you to return in 2 months after starting the Zetia  to have the following labs drawn: lipid, AST and ALT.   Please go to [facility] [address] ([facility] [address], [place] [address] You do not need an appointment.  They are open from 8 am- 4:30 pm.  Lunch from 1:00 pm- 2:00 pm You DO need to be fasting.   You may also go to one of the following [facility]:  [address] [place], [place] [address] Phone: [phone] Lab hours: Mon-Fri 8 am- 5 pm    Lunch 12 pm- 1 pm  [address] [place],  [place]  [address]  [place]  Phone: [phone] Lab hours: 7 am- 4 pm Lunch 12 pm-1 pm   [address] [place],  [place]  [address]  [place]  Phone: [phone] Lab hours: Mon-Fri 8 am- 5 pm    Lunch 12 pm- 1 pm  If you have labs (blood work) drawn today and your tests are completely normal, you will receive your results only by: MyChart Message (if you have MyChart) OR A paper copy in the mail If you have any lab test that is abnormal or we need to change your treatment, we will call you to review the results.  Follow-Up: At [facility], you and your health needs are our priority.  As part of our continuing mission to provide you with exceptional heart care, our providers are all part of one team.  This team includes your primary Cardiologist (physician) and Advanced Practice Providers or APPs (Physician Assistants and Nurse Practitioners) who all work together to provide you with the care you need, when you need it.  Your next appointment:   6 month(s)  Provider:   You may see Deatrice Cage, MD or Bernardino Bring, PA-C  We recommend signing up for the patient portal called MyChart.  Sign up  information is provided on this After Visit Summary.  MyChart is used to connect with patients for Virtual Visits (Telemedicine).  Patients are able to view lab/test results, encounter notes, upcoming appointments, etc.  Non-urgent messages can be sent to your provider as well.   To learn more about what you can do with MyChart, go to [email or web address].

## 2024-01-05 ENCOUNTER — Other Ambulatory Visit

## 2024-01-05 DIAGNOSIS — E782 Mixed hyperlipidemia: Secondary | ICD-10-CM | POA: Diagnosis not present

## 2024-01-05 DIAGNOSIS — R7303 Prediabetes: Secondary | ICD-10-CM | POA: Diagnosis not present

## 2024-01-06 ENCOUNTER — Other Ambulatory Visit: Payer: Self-pay | Admitting: Internal Medicine

## 2024-01-06 LAB — LIPID PANEL
Chol/HDL Ratio: 2.4 ratio (ref 0.0–5.0)
Cholesterol, Total: 170 mg/dL (ref 100–199)
HDL: 72 mg/dL (ref >39)
LDL Chol Calc (NIH): 78 mg/dL (ref 0–99)
Triglycerides: 111 mg/dL (ref 0–149)
VLDL Cholesterol Cal: 20 mg/dL (ref 5–40)

## 2024-01-06 LAB — HEMOGLOBIN A1C
Est. average glucose Bld gHb Est-mCnc: 117 mg/dL
Hgb A1c MFr Bld: 5.7 % — ABNORMAL HIGH (ref 4.8–5.6)

## 2024-01-11 ENCOUNTER — Ambulatory Visit: Payer: Self-pay | Admitting: Internal Medicine

## 2024-01-11 ENCOUNTER — Ambulatory Visit (INDEPENDENT_AMBULATORY_CARE_PROVIDER_SITE_OTHER): Admitting: Internal Medicine

## 2024-01-11 ENCOUNTER — Encounter: Payer: Self-pay | Admitting: Internal Medicine

## 2024-01-11 VITALS — BP 130/76 | HR 78 | Temp 97.9°F | Ht 73.0 in | Wt 185.2 lb

## 2024-01-11 DIAGNOSIS — N400 Enlarged prostate without lower urinary tract symptoms: Secondary | ICD-10-CM | POA: Diagnosis not present

## 2024-01-11 DIAGNOSIS — E782 Mixed hyperlipidemia: Secondary | ICD-10-CM | POA: Diagnosis not present

## 2024-01-11 DIAGNOSIS — I10 Essential (primary) hypertension: Secondary | ICD-10-CM

## 2024-01-11 DIAGNOSIS — R7303 Prediabetes: Secondary | ICD-10-CM | POA: Diagnosis not present

## 2024-01-11 DIAGNOSIS — N528 Other male erectile dysfunction: Secondary | ICD-10-CM

## 2024-01-11 DIAGNOSIS — K5903 Drug induced constipation: Secondary | ICD-10-CM | POA: Diagnosis not present

## 2024-01-11 DIAGNOSIS — T402X5A Adverse effect of other opioids, initial encounter: Secondary | ICD-10-CM

## 2024-01-11 MED ORDER — DOCUSATE SODIUM 100 MG PO CAPS: 100.0000 mg | ORAL_CAPSULE | Freq: Every day | ORAL | 1 refills | Status: AC | PRN

## 2024-01-11 NOTE — Progress Notes (Signed)
 Established Patient Office Visit  Subjective:  Patient ID: Charles Lamb, male    DOB: [birth year]  Age: 63 y.o. MRN: [id number]  Chief Complaint  Patient presents with   Follow-up    3 month lab results    No new complaints, here for lab review and medication refills. LDL and TC well controlled on lab review. Triglycerides also satisfactory and A1c improved while remaining in the prediabetic range.     No other concerns at this time.   Past Medical History:  Diagnosis Date   Arthritis    Hemorrhoids    Hyperlipidemia    Hypertension    Renal disorder     Past Surgical History:  Procedure Laterality Date   COLONOSCOPY WITH PROPOFOL  N/A 02/07/2019   Procedure: COLONOSCOPY WITH PROPOFOL ;  Surgeon: Therisa Bi, MD;  Location: [facility] ENDOSCOPY;  Service: Gastroenterology;  Laterality: N/A;   EYE SURGERY Left    FLEXIBLE SIGMOIDOSCOPY N/A 04/30/2020   Procedure: FLEXIBLE SIGMOIDOSCOPY;  Surgeon: Therisa Bi, MD;  Location: [facility] ENDOSCOPY;  Service: Gastroenterology;  Laterality: N/A;   TOTAL SHOULDER ARTHROPLASTY Left 05/25/2018   Procedure: LEFT SHOULDER HEMIARTHROPLASTY, BICEPS TENODESIS;  Surgeon: Leora Lynwood SAUNDERS, MD;  Location: [facility] ORS;  Service: Orthopedics;  Laterality: Left;   TOTAL SHOULDER REPLACEMENT Left     Social History   Socioeconomic History   Marital status: Single    Spouse name: Not on file   Number of children: 0   Years of education: Not on file   Highest education level: High school graduate  Occupational History   Occupation: disability    Comment: Former HOSPITAL DOCTOR  Tobacco Use   Smoking status: Every Day    Current packs/day: 0.25    Average packs/day: 0.3 packs/day for 45.0 years (11.3 ttl pk-yrs)    Types: Cigarettes   Smokeless tobacco: Never  Vaping Use   Vaping status: Never Used  Substance and Sexual Activity   Alcohol use: Yes    Alcohol/week: 7.0 standard drinks of alcohol    Types: 7 Cans of beer per week    Comment: 1 40oz a day    Drug use: Never   Sexual activity: Not on file  Other Topics Concern   Not on file  Social History Narrative   ** Merged History Encounter **       Social Drivers of Health   Financial Resource Strain: Medium Risk (12/18/2021)   Overall Financial Resource Strain (CARDIA)    Difficulty of Paying Living Expenses: Somewhat hard  Food Insecurity: No Food Insecurity (12/18/2021)   Hunger Vital Sign    Worried About Running Out of Food in the Last Year: Never true    Ran Out of Food in the Last Year: Never true  Transportation Needs: No Transportation Needs (12/18/2021)   PRAPARE - Administrator, Civil Service (Medical): No    Lack of Transportation (Non-Medical): No  Physical Activity: Inactive (01/12/2018)   Exercise Vital Sign    Days of Exercise per Week: 0 days    Minutes of Exercise per Session: 0 min  Stress: Not on file  Social Connections: Unknown (01/12/2018)   Social Connection and Isolation Panel    Frequency of Communication with Friends and Family: More than three times a week    Frequency of Social Gatherings with Friends and Family: More than three times a week    Attends Religious Services: Not asked    Active Member of Clubs or Organizations: Not on file  Attends Banker Meetings: Not on file    Marital Status: Not on file  Intimate Partner Violence: Not on file    Family History  Problem Relation Age of Onset   Kidney disease Mother    Stomach cancer Sister    Kidney disease Brother    Kidney disease Brother    Kidney disease Brother     No Known Allergies  Outpatient Medications Prior to Visit  Medication Sig   aspirin  EC 81 MG tablet Take 1 tablet (81 mg total) by mouth daily. Swallow whole.   carvedilol  (COREG ) 3.125 MG tablet Take 1 tablet by mouth twice daily   Cholecalciferol (VITAMIN D3) 250 MCG (10000 UT) CAPS Take 1 capsule by mouth daily.   ezetimibe  (ZETIA ) 10 MG tablet Take 1 tablet (10 mg total) by mouth daily.    oxyCODONE  (OXY IR/ROXICODONE ) 5 MG immediate release tablet Take 5 mg by mouth every 4 (four) hours as needed.   rosuvastatin  (CRESTOR ) 40 MG tablet Take 1 tablet by mouth once daily   No facility-administered medications prior to visit.    Review of Systems  Constitutional: Negative.   HENT: Negative.    Eyes:        Left eye visual loss  Respiratory: Negative.    Cardiovascular: Negative.   Gastrointestinal: Negative.   Genitourinary: Negative.   Skin: Negative.   Neurological: Negative.   Endo/Heme/Allergies: Negative.        Objective:   BP 130/76   Pulse 78   Temp 97.9 F (36.6 C)   Ht 6' 1 (1.854 m)   Wt 185 lb 3.2 oz (84 kg)   SpO2 99%   BMI 24.43 kg/m   Vitals:   01/11/24 1446  BP: 130/76  Pulse: 78  Temp: 97.9 F (36.6 C)  Height: 6' 1 (1.854 m)  Weight: 185 lb 3.2 oz (84 kg)  SpO2: 99%  BMI (Calculated): 24.44    Physical Exam Vitals reviewed.  Constitutional:      Appearance: Normal appearance.  HENT:     Head: Normocephalic.     Left Ear: There is no impacted cerumen.     Nose: Nose normal.     Mouth/Throat:     Mouth: Mucous membranes are moist.     Pharynx: No posterior oropharyngeal erythema.  Eyes:     General: Lids are normal. Visual field deficit present.     Extraocular Movements: Extraocular movements intact.     Pupils: Pupils are equal, round, and reactive to light.     Comments: Opaque left cornea  Cardiovascular:     Rate and Rhythm: Regular rhythm.     Chest Wall: PMI is not displaced.     Pulses: Normal pulses.     Heart sounds: Normal heart sounds. No murmur heard. Pulmonary:     Effort: Pulmonary effort is normal.     Breath sounds: Normal air entry. No rhonchi or rales.  Abdominal:     General: Abdomen is flat. Bowel sounds are normal. There is no distension.     Palpations: Abdomen is soft. There is no hepatomegaly, splenomegaly or mass.     Tenderness: There is no abdominal tenderness.  Musculoskeletal:         General: Normal range of motion.     Cervical back: Normal range of motion and neck supple. No tenderness. Muscular tenderness (upper trap and right paracervical) present.     Right lower leg: No edema.     Left lower  leg: No edema.  Skin:    General: Skin is warm and dry.  Neurological:     General: No focal deficit present.     Mental Status: He is alert and oriented to person, place, and time.     Cranial Nerves: No cranial nerve deficit.     Motor: No weakness.     Deep Tendon Reflexes: Reflexes abnormal.     Comments: Hyporeflexic globally  Psychiatric:        Mood and Affect: Mood normal.        Behavior: Behavior normal.      No results found for any visits on 01/11/24.  Recent Results (from the past 2160 hours)  Lipid panel     Status: None   Collection Time: 01/05/24 12:10 PM  Result Value Ref Range   Cholesterol, Total 170 100 - 199 mg/dL   Triglycerides 888 0 - 149 mg/dL   HDL 72 >60 mg/dL   VLDL Cholesterol Cal 20 5 - 40 mg/dL   LDL Chol Calc (NIH) 78 0 - 99 mg/dL   Chol/HDL Ratio 2.4 0.0 - 5.0 ratio    Comment:                                   T. Chol/HDL Ratio                                             Men  Women                               1/2 Avg.Risk  3.4    3.3                                   Avg.Risk  5.0    4.4                                2X Avg.Risk  9.6    7.1                                3X Avg.Risk 23.4   11.0   Hemoglobin A1c     Status: Abnormal   Collection Time: 01/05/24 12:10 PM  Result Value Ref Range   Hgb A1c MFr Bld 5.7 (H) 4.8 - 5.6 %    Comment:          Prediabetes: 5.7 - 6.4          Diabetes: >6.4          Glycemic control for adults with diabetes: <7.0    Est. average glucose Bld gHb Est-mCnc 117 mg/dL      Assessment & Plan:   Problem List Items Addressed This Visit       Cardiovascular and Mediastinum   Hypertension   Relevant Medications   docusate sodium  (COLACE) 100 MG capsule   Other Relevant  Orders   CBC With Diff/Platelet   Comprehensive metabolic panel with GFR     Other   Mixed hyperlipidemia - Primary   Relevant Orders  Comprehensive metabolic panel with GFR   Prediabetes   Relevant Orders   Hemoglobin A1c   Other male erectile dysfunction   Other Visit Diagnoses       Benign prostatic hyperplasia without lower urinary tract symptoms       Relevant Medications   docusate sodium  (COLACE) 100 MG capsule   Other Relevant Orders   PSA     Opioid-induced constipation       Relevant Medications   docusate sodium  (COLACE) 100 MG capsule       Return in about 3 months (around 04/12/2024) for cpe with labs prior.   Total time spent: 20 minutes. This time includes review of previous notes and results and patient face to face interaction during today'Ayshia Gramlich visit.    Sherrill Cinderella Perry, MD  01/11/2024   This document may have been prepared by [facility] Voice Recognition software and as such may include unintentional dictation errors.

## 2024-01-25 ENCOUNTER — Ambulatory Visit (INDEPENDENT_AMBULATORY_CARE_PROVIDER_SITE_OTHER)

## 2024-01-25 ENCOUNTER — Encounter (INDEPENDENT_AMBULATORY_CARE_PROVIDER_SITE_OTHER): Payer: Self-pay | Admitting: Vascular Surgery

## 2024-01-25 ENCOUNTER — Ambulatory Visit (INDEPENDENT_AMBULATORY_CARE_PROVIDER_SITE_OTHER): Admitting: Vascular Surgery

## 2024-01-25 VITALS — BP 129/83 | HR 75 | Resp 18 | Ht 73.0 in | Wt 180.2 lb

## 2024-01-25 DIAGNOSIS — I10 Essential (primary) hypertension: Secondary | ICD-10-CM | POA: Diagnosis not present

## 2024-01-25 DIAGNOSIS — I6521 Occlusion and stenosis of right carotid artery: Secondary | ICD-10-CM

## 2024-01-25 DIAGNOSIS — E782 Mixed hyperlipidemia: Secondary | ICD-10-CM

## 2024-01-25 NOTE — Progress Notes (Signed)
 MRN : [id number]  Charles Lamb is a 63 y.o. ([birth year]) male who presents with chief complaint of  Chief Complaint  Patient presents with   Follow-up    6 month carotid  .  History of Present Illness: Patient returns in follow-up of his carotid disease.  He is doing well today.  He has had a couple of episodes of lightheadedness with standing but nothing significant and no focal neurologic symptoms.  His carotid duplex today continues to demonstrate velocities in the upper end of the 40 to 59% range on the right and in the 1 to 39% range on the left.  He remains on aspirin  and Crestor .  Current Outpatient Medications  Medication Sig Dispense Refill   aspirin  EC 81 MG tablet Take 1 tablet (81 mg total) by mouth daily. Swallow whole. 90 tablet 4   carvedilol  (COREG ) 3.125 MG tablet Take 1 tablet by mouth twice daily 180 tablet 2   Cholecalciferol (VITAMIN D3) 250 MCG (10000 UT) CAPS Take 1 capsule by mouth daily.     docusate sodium  (COLACE) 100 MG capsule Take 1 capsule (100 mg total) by mouth daily as needed. 30 capsule 1   ezetimibe  (ZETIA ) 10 MG tablet Take 1 tablet (10 mg total) by mouth daily. 90 tablet 3   oxyCODONE  (OXY IR/ROXICODONE ) 5 MG immediate release tablet Take 5 mg by mouth every 4 (four) hours as needed.     rosuvastatin  (CRESTOR ) 40 MG tablet Take 1 tablet by mouth once daily 90 tablet 0   No current facility-administered medications for this visit.    Past Medical History:  Diagnosis Date   Arthritis    Hemorrhoids    Hyperlipidemia    Hypertension    Renal disorder     Past Surgical History:  Procedure Laterality Date   COLONOSCOPY WITH PROPOFOL  N/A 02/07/2019   Procedure: COLONOSCOPY WITH PROPOFOL ;  Surgeon: Therisa Bi, MD;  Location: [facility] ENDOSCOPY;  Service: Gastroenterology;  Laterality: N/A;   EYE SURGERY Left    FLEXIBLE SIGMOIDOSCOPY N/A 04/30/2020   Procedure: FLEXIBLE SIGMOIDOSCOPY;  Surgeon: Therisa Bi, MD;  Location: [facility] ENDOSCOPY;   Service: Gastroenterology;  Laterality: N/A;   TOTAL SHOULDER ARTHROPLASTY Left 05/25/2018   Procedure: LEFT SHOULDER HEMIARTHROPLASTY, BICEPS TENODESIS;  Surgeon: Leora Lynwood SAUNDERS, MD;  Location: [facility] ORS;  Service: Orthopedics;  Laterality: Left;   TOTAL SHOULDER REPLACEMENT Left      Social History   Tobacco Use   Smoking status: Every Day    Current packs/day: 0.25    Average packs/day: 0.3 packs/day for 45.0 years (11.3 ttl pk-yrs)    Types: Cigarettes   Smokeless tobacco: Never  Vaping Use   Vaping status: Never Used  Substance Use Topics   Alcohol use: Yes    Alcohol/week: 7.0 standard drinks of alcohol    Types: 7 Cans of beer per week    Comment: 1 40oz a day   Drug use: Never      Family History  Problem Relation Age of Onset   Kidney disease Mother    Stomach cancer Sister    Kidney disease Brother    Kidney disease Brother    Kidney disease Brother      No Known Allergies   REVIEW OF SYSTEMS (Negative unless checked)  Constitutional: [] Weight loss  [] Fever  [] Chills Cardiac: [] Chest pain   [] Chest pressure   [] Palpitations   [] Shortness of breath when laying flat   [] Shortness of breath at rest   [] Shortness  of breath with exertion. Vascular:  [] Pain in legs with walking   [] Pain in legs at rest   [] Pain in legs when laying flat   [] Claudication   [] Pain in feet when walking  [] Pain in feet at rest  [] Pain in feet when laying flat   [] History of DVT   [] Phlebitis   [] Swelling in legs   [] Varicose veins   [] Non-healing ulcers Pulmonary:   [] Uses home oxygen   [] Productive cough   [] Hemoptysis   [] Wheeze  [] COPD   [] Asthma Neurologic:  [] Dizziness  [] Blackouts   [] Seizures   [] History of stroke   [] History of TIA  [] Aphasia   [] Temporary blindness   [] Dysphagia   [] Weakness or numbness in arms   [] Weakness or numbness in legs Musculoskeletal:  [x] Arthritis   [] Joint swelling   [] Joint pain   [] Low back pain Hematologic:  [] Easy bruising  [] Easy bleeding    [] Hypercoagulable state   [] Anemic  [] Hepatitis Gastrointestinal:  [] Blood in stool   [] Vomiting blood  [] Gastroesophageal reflux/heartburn   [] Difficulty swallowing. Genitourinary:  [x] Chronic kidney disease   [] Difficult urination  [] Frequent urination  [] Burning with urination   [] Blood in urine Skin:  [] Rashes   [] Ulcers   [] Wounds Psychological:  [] History of anxiety   []  History of major depression.  Physical Examination  Vitals:   01/25/24 1336  BP: 129/83  Pulse: 75  Resp: 18  Weight: 180 lb 3.2 oz (81.7 kg)  Height: 6' 1 (1.854 m)   Body mass index is 23.77 kg/m. Gen:  WD/WN, NAD. Head: [place]/AT, No temporalis wasting. Ear/Nose/Throat: Hearing grossly intact, nares w/o erythema or drainage, trachea midline Eyes: Conjunctiva clear. Sclera non-icteric Neck: Supple.  Right carotid bruit  Pulmonary:  Good air movement, equal and clear to auscultation bilaterally.  Cardiac: RRR, No JVD Vascular:  Vessel Right Left  Radial Palpable Palpable           Musculoskeletal: M/S 5/5 throughout.  No deformity or atrophy. No edema. Neurologic: CN 2-12 intact. Sensation grossly intact in extremities.  Symmetrical.  Speech is fluent. Motor exam as listed above. Psychiatric: Judgment intact, Mood & affect appropriate for pt's clinical situation. Dermatologic: No rashes or ulcers noted.  No cellulitis or open wounds.     CBC Lab Results  Component Value Date   WBC 5.9 10/05/2023   HGB 13.6 10/05/2023   HCT 41.3 10/05/2023   MCV 95 10/05/2023   PLT 180 10/05/2023    BMET    Component Value Date/Time   NA 142 10/05/2023 0909   NA 142 03/30/2011 0523   K 3.9 10/05/2023 0909   K 4.2 03/30/2011 0523   CL 104 10/05/2023 0909   CL 106 03/30/2011 0523   CO2 20 10/05/2023 0909   CO2 26 03/30/2011 0523   GLUCOSE 109 (H) 10/05/2023 0909   GLUCOSE 121 (H) 05/26/2018 0330   GLUCOSE 93 03/30/2011 0523   BUN 9 10/05/2023 0909   BUN 10 03/30/2011 0523   CREATININE 0.97 10/05/2023  0909   CREATININE 1.02 03/30/2011 0523   CALCIUM  9.4 10/05/2023 0909   CALCIUM  9.5 03/30/2011 0523   GFRNONAA 73 10/26/2019 1357   GFRNONAA >60 03/30/2011 0523   GFRAA 84 10/26/2019 1357   GFRAA >60 03/30/2011 0523   CrCl cannot be calculated (Patient's most recent lab result is older than the maximum 21 days allowed.).  COAG Lab Results  Component Value Date   INR 1.0 05/18/2018    Radiology No results found.  Assessment/Plan Carotid stenosis, asymptomatic, right His carotid duplex today continues to demonstrate velocities in the upper end of the 40 to 59% range on the right and in the 1 to 39% range on the left.  He remains on aspirin  and Crestor .  No role for intervention at this level.  Recheck in 6 months.  Hypertension blood pressure control important in reducing the progression of atherosclerotic disease. On appropriate oral medications.     Mixed hyperlipidemia lipid control important in reducing the progression of atherosclerotic disease. Continue statin therapy  Selinda Gu, MD  01/25/2024 2:11 PM    This note was created with Dragon medical transcription system.  Any errors from dictation are purely unintentional

## 2024-01-25 NOTE — Assessment & Plan Note (Signed)
 His carotid duplex today continues to demonstrate velocities in the upper end of the 40 to 59% range on the right and in the 1 to 39% range on the left.  He remains on aspirin  and Crestor .  No role for intervention at this level.  Recheck in 6 months.

## 2024-02-17 ENCOUNTER — Encounter: Payer: Self-pay | Admitting: Cardiology

## 2024-02-17 ENCOUNTER — Ambulatory Visit: Admitting: Cardiology

## 2024-02-17 VITALS — BP 144/80 | HR 93 | Ht 73.0 in | Wt 184.8 lb

## 2024-02-17 DIAGNOSIS — K625 Hemorrhage of anus and rectum: Secondary | ICD-10-CM

## 2024-02-17 DIAGNOSIS — K649 Unspecified hemorrhoids: Secondary | ICD-10-CM | POA: Diagnosis not present

## 2024-02-17 DIAGNOSIS — I10 Essential (primary) hypertension: Secondary | ICD-10-CM | POA: Diagnosis not present

## 2024-02-17 DIAGNOSIS — R7303 Prediabetes: Secondary | ICD-10-CM

## 2024-02-17 NOTE — Progress Notes (Signed)
 Established Patient Office Visit  Subjective:  Patient ID: Charles Lamb, male    DOB: November 28, 1960  Age: 63 y.o. MRN: [id number]  Chief Complaint  Patient presents with   Referral    Pt. Requesting  new referral to [facility] for Dr.Anna GI due to a flare up of roids and rectal bleeding.    Patient in office for an acute visit, needs a new referral to GI, current provider moved offices, needs new referral. Patient has a known history of hemorrhoids and rectal bleeding. Patient as seen Dr. Therisa in the past. Dr. Therisa is at a new practice, new referral needed. Referral sent.  Patient has no other complaints today.  Blood pressure stable today.  Continue current medications.     No other concerns at this time.   Past Medical History:  Diagnosis Date   Arthritis    Hemorrhoids    Hyperlipidemia    Hypertension    Renal disorder     Past Surgical History:  Procedure Laterality Date   COLONOSCOPY WITH PROPOFOL  N/A 02/07/2019   Procedure: COLONOSCOPY WITH PROPOFOL ;  Surgeon: Therisa Bi, MD;  Location: [facility] ENDOSCOPY;  Service: Gastroenterology;  Laterality: N/A;   EYE SURGERY Left    FLEXIBLE SIGMOIDOSCOPY N/A 04/30/2020   Procedure: FLEXIBLE SIGMOIDOSCOPY;  Surgeon: Therisa Bi, MD;  Location: [facility] ENDOSCOPY;  Service: Gastroenterology;  Laterality: N/A;   TOTAL SHOULDER ARTHROPLASTY Left 05/25/2018   Procedure: LEFT SHOULDER HEMIARTHROPLASTY, BICEPS TENODESIS;  Surgeon: Leora Lynwood SAUNDERS, MD;  Location: [facility] ORS;  Service: Orthopedics;  Laterality: Left;   TOTAL SHOULDER REPLACEMENT Left     Social History   Socioeconomic History   Marital status: Single    Spouse name: Not on file   Number of children: 0   Years of education: Not on file   Highest education level: High school graduate  Occupational History   Occupation: disability    Comment: Former HOSPITAL DOCTOR  Tobacco Use   Smoking status: Every Day    Current packs/day: 0.25    Average packs/day: 0.3 packs/day for 45.0 years  (11.3 ttl pk-yrs)    Types: Cigarettes   Smokeless tobacco: Never  Vaping Use   Vaping status: Never Used  Substance and Sexual Activity   Alcohol use: Yes    Alcohol/week: 7.0 standard drinks of alcohol    Types: 7 Cans of beer per week    Comment: 1 40oz a day   Drug use: Never   Sexual activity: Not on file  Other Topics Concern   Not on file  Social History Narrative   ** Merged History Encounter **       Social Drivers of Health   Financial Resource Strain: Medium Risk (12/18/2021)   Overall Financial Resource Strain (CARDIA)    Difficulty of Paying Living Expenses: Somewhat hard  Food Insecurity: No Food Insecurity (12/18/2021)   Hunger Vital Sign    Worried About Running Out of Food in the Last Year: Never true    Ran Out of Food in the Last Year: Never true  Transportation Needs: No Transportation Needs (12/18/2021)   PRAPARE - Administrator, Civil Service (Medical): No    Lack of Transportation (Non-Medical): No  Physical Activity: Inactive (01/12/2018)   Exercise Vital Sign    Days of Exercise per Week: 0 days    Minutes of Exercise per Session: 0 min  Stress: Not on file  Social Connections: Unknown (01/12/2018)   Social Connection and Isolation Panel  Frequency of Communication with Friends and Family: More than three times a week    Frequency of Social Gatherings with Friends and Family: More than three times a week    Attends Religious Services: Not asked    Active Member of Clubs or Organizations: Not on file    Attends Banker Meetings: Not on file    Marital Status: Not on file  Intimate Partner Violence: Not on file    Family History  Problem Relation Age of Onset   Kidney disease Mother    Stomach cancer Sister    Kidney disease Brother    Kidney disease Brother    Kidney disease Brother     No Known Allergies  Outpatient Medications Prior to Visit  Medication Sig   aspirin  EC 81 MG tablet Take 1 tablet (81 mg  total) by mouth daily. Swallow whole.   carvedilol  (COREG ) 3.125 MG tablet Take 1 tablet by mouth twice daily   Cholecalciferol (VITAMIN D3) 250 MCG (10000 UT) CAPS Take 1 capsule by mouth daily.   docusate sodium  (COLACE) 100 MG capsule Take 1 capsule (100 mg total) by mouth daily as needed.   ezetimibe  (ZETIA ) 10 MG tablet Take 1 tablet (10 mg total) by mouth daily.   oxyCODONE  (OXY IR/ROXICODONE ) 5 MG immediate release tablet Take 5 mg by mouth every 4 (four) hours as needed.   rosuvastatin  (CRESTOR ) 40 MG tablet Take 1 tablet by mouth once daily   No facility-administered medications prior to visit.    Review of Systems  Constitutional: Negative.   HENT: Negative.    Eyes: Negative.   Respiratory: Negative.  Negative for shortness of breath.   Cardiovascular: Negative.  Negative for chest pain.  Gastrointestinal:  Positive for blood in stool. Negative for abdominal pain, constipation and diarrhea.  Genitourinary: Negative.   Musculoskeletal:  Negative for joint pain and myalgias.  Skin: Negative.   Neurological: Negative.  Negative for dizziness and headaches.  Endo/Heme/Allergies: Negative.   All other systems reviewed and are negative.      Objective:   BP (!) 144/80   Pulse 93   Ht 6' 1 (1.854 m)   Wt 184 lb 12.8 oz (83.8 kg)   SpO2 97%   BMI 24.38 kg/m   Vitals:   02/17/24 1423  BP: (!) 144/80  Pulse: 93  Height: 6' 1 (1.854 m)  Weight: 184 lb 12.8 oz (83.8 kg)  SpO2: 97%  BMI (Calculated): 24.39    Physical Exam Nursing note reviewed.  Constitutional:      Appearance: Normal appearance. He is normal weight.  HENT:     Head: Normocephalic and atraumatic.     Nose: Nose normal.     Mouth/Throat:     Mouth: Mucous membranes are moist.     Pharynx: Oropharynx is clear.  Eyes:     Extraocular Movements: Extraocular movements intact.     Conjunctiva/sclera: Conjunctivae normal.     Pupils: Pupils are equal, round, and reactive to light.   Cardiovascular:     Rate and Rhythm: Normal rate and regular rhythm.     Pulses: Normal pulses.     Heart sounds: Normal heart sounds.  Pulmonary:     Effort: Pulmonary effort is normal.     Breath sounds: Normal breath sounds.  Abdominal:     General: Abdomen is flat. Bowel sounds are normal.     Palpations: Abdomen is soft.  Musculoskeletal:        General: Normal  range of motion.     Cervical back: Normal range of motion.  Skin:    General: Skin is warm and dry.  Neurological:     General: No focal deficit present.     Mental Status: He is alert and oriented to person, place, and time.  Psychiatric:        Mood and Affect: Mood normal.        Behavior: Behavior normal.        Thought Content: Thought content normal.        Judgment: Judgment normal.      No results found for any visits on 02/17/24.  Recent Results (from the past 2160 hours)  Lipid panel     Status: None   Collection Time: 01/05/24 12:10 PM  Result Value Ref Range   Cholesterol, Total 170 100 - 199 mg/dL   Triglycerides 888 0 - 149 mg/dL   HDL 72 >60 mg/dL   VLDL Cholesterol Cal 20 5 - 40 mg/dL   LDL Chol Calc (NIH) 78 0 - 99 mg/dL   Chol/HDL Ratio 2.4 0.0 - 5.0 ratio    Comment:                                   T. Chol/HDL Ratio                                             Men  Women                               1/2 Avg.Risk  3.4    3.3                                   Avg.Risk  5.0    4.4                                2X Avg.Risk  9.6    7.1                                3X Avg.Risk 23.4   11.0   Hemoglobin A1c     Status: Abnormal   Collection Time: 01/05/24 12:10 PM  Result Value Ref Range   Hgb A1c MFr Bld 5.7 (H) 4.8 - 5.6 %    Comment:          Prediabetes: 5.7 - 6.4          Diabetes: >6.4          Glycemic control for adults with diabetes: <7.0    Est. average glucose Bld gHb Est-mCnc 117 mg/dL      Assessment & Plan:  Referral sent to GI Continue current  medications  Problem List Items Addressed This Visit       Cardiovascular and Mediastinum   Hypertension   Hemorrhoids   Relevant Orders   Ambulatory referral to Gastroenterology     Digestive   Rectal bleeding - Primary   Relevant Orders   Ambulatory referral to Gastroenterology    Return if symptoms  worsen or fail to improve, for as scheduled with TJ.   Total time spent: 25 minutes. This time includes review of previous notes and results and patient face to face interaction during today's visit.    Jeoffrey Pollen, NP  02/17/2024   This document may have been prepared by Dragon Voice Recognition software and as such may include unintentional dictation errors.

## 2024-02-18 DIAGNOSIS — K649 Unspecified hemorrhoids: Secondary | ICD-10-CM | POA: Insufficient documentation

## 2024-02-23 DIAGNOSIS — E785 Hyperlipidemia, unspecified: Secondary | ICD-10-CM | POA: Diagnosis not present

## 2024-02-24 ENCOUNTER — Ambulatory Visit: Payer: Self-pay | Admitting: Physician Assistant

## 2024-02-24 DIAGNOSIS — K760 Fatty (change of) liver, not elsewhere classified: Secondary | ICD-10-CM

## 2024-02-24 DIAGNOSIS — Z79899 Other long term (current) drug therapy: Secondary | ICD-10-CM

## 2024-02-24 DIAGNOSIS — E785 Hyperlipidemia, unspecified: Secondary | ICD-10-CM

## 2024-02-24 LAB — LIPID PANEL
Chol/HDL Ratio: 2.3 ratio (ref 0.0–5.0)
Cholesterol, Total: 173 mg/dL (ref 100–199)
HDL: 74 mg/dL (ref >39)
LDL Chol Calc (NIH): 82 mg/dL (ref 0–99)
Triglycerides: 93 mg/dL (ref 0–149)
VLDL Cholesterol Cal: 17 mg/dL (ref 5–40)

## 2024-02-24 LAB — ALT: ALT: 55 IU/L — ABNORMAL HIGH (ref 0–44)

## 2024-02-24 LAB — AST: AST: 46 IU/L — ABNORMAL HIGH (ref 0–40)

## 2024-03-12 ENCOUNTER — Other Ambulatory Visit: Payer: Self-pay | Admitting: Cardiovascular Disease

## 2024-03-13 ENCOUNTER — Other Ambulatory Visit: Payer: Self-pay | Admitting: Cardiovascular Disease

## 2024-03-15 MED ORDER — CARVEDILOL 3.125 MG PO TABS: 3.1250 mg | ORAL_TABLET | Freq: Two times a day (BID) | ORAL | 3 refills | Status: AC

## 2024-04-06 ENCOUNTER — Other Ambulatory Visit: Payer: Self-pay | Admitting: Internal Medicine

## 2024-04-12 ENCOUNTER — Other Ambulatory Visit

## 2024-04-13 LAB — COMPREHENSIVE METABOLIC PANEL WITH GFR
ALT: 14 [IU]/L (ref 0–44)
AST: 16 [IU]/L (ref 0–40)
Albumin: 4.4 g/dL (ref 3.9–4.9)
Alkaline Phosphatase: 60 [IU]/L (ref 47–123)
BUN/Creatinine Ratio: 12 (ref 10–24)
BUN: 11 mg/dL (ref 8–27)
Bilirubin Total: 0.3 mg/dL (ref 0.0–1.2)
CO2: 21 mmol/L (ref 20–29)
Calcium: 9.6 mg/dL (ref 8.6–10.2)
Chloride: 101 mmol/L (ref 96–106)
Creatinine, Ser: 0.94 mg/dL (ref 0.76–1.27)
Globulin, Total: 2.3 g/dL (ref 1.5–4.5)
Glucose: 98 mg/dL (ref 70–99)
Potassium: 4.4 mmol/L (ref 3.5–5.2)
Sodium: 138 mmol/L (ref 134–144)
Total Protein: 6.7 g/dL (ref 6.0–8.5)
eGFR: 92 mL/min/{1.73_m2}

## 2024-04-13 LAB — PSA: Prostate Specific Ag, Serum: 1.8 ng/mL (ref 0.0–4.0)

## 2024-04-13 LAB — CBC WITH DIFF/PLATELET
Basophils Absolute: 0 10*3/uL (ref 0.0–0.2)
Basos: 1 %
EOS (ABSOLUTE): 0.2 10*3/uL (ref 0.0–0.4)
Eos: 3 %
Hematocrit: 39.1 % (ref 37.5–51.0)
Hemoglobin: 13.1 g/dL (ref 13.0–17.7)
Immature Grans (Abs): 0 10*3/uL (ref 0.0–0.1)
Immature Granulocytes: 0 %
Lymphocytes Absolute: 1.7 10*3/uL (ref 0.7–3.1)
Lymphs: 32 %
MCH: 31 pg (ref 26.6–33.0)
MCHC: 33.5 g/dL (ref 31.5–35.7)
MCV: 92 fL (ref 79–97)
Monocytes Absolute: 0.6 10*3/uL (ref 0.1–0.9)
Monocytes: 12 %
Neutrophils Absolute: 2.8 10*3/uL (ref 1.4–7.0)
Neutrophils: 51 %
Platelets: 279 10*3/uL (ref 150–450)
RBC: 4.23 x10E6/uL (ref 4.14–5.80)
RDW: 12.5 % (ref 11.6–15.4)
WBC: 5.4 10*3/uL (ref 3.4–10.8)

## 2024-04-13 LAB — HEMOGLOBIN A1C
Est. average glucose Bld gHb Est-mCnc: 123 mg/dL
Hgb A1c MFr Bld: 5.9 % — ABNORMAL HIGH (ref 4.8–5.6)

## 2024-04-14 ENCOUNTER — Encounter: Payer: Self-pay | Admitting: Internal Medicine

## 2024-04-14 ENCOUNTER — Ambulatory Visit: Admitting: Internal Medicine

## 2024-04-14 ENCOUNTER — Ambulatory Visit: Payer: Self-pay | Admitting: Internal Medicine

## 2024-04-14 VITALS — BP 127/79 | HR 70 | Temp 98.1°F | Ht 73.0 in | Wt 189.6 lb

## 2024-04-14 DIAGNOSIS — E782 Mixed hyperlipidemia: Secondary | ICD-10-CM

## 2024-04-14 DIAGNOSIS — F17210 Nicotine dependence, cigarettes, uncomplicated: Secondary | ICD-10-CM | POA: Diagnosis not present

## 2024-04-14 DIAGNOSIS — Z0001 Encounter for general adult medical examination with abnormal findings: Secondary | ICD-10-CM | POA: Diagnosis not present

## 2024-04-14 DIAGNOSIS — I10 Essential (primary) hypertension: Secondary | ICD-10-CM

## 2024-04-14 DIAGNOSIS — Z1331 Encounter for screening for depression: Secondary | ICD-10-CM

## 2024-04-14 DIAGNOSIS — R7303 Prediabetes: Secondary | ICD-10-CM

## 2024-04-14 NOTE — Progress Notes (Signed)
 "  Established Patient Office Visit  Subjective:  Patient ID: Charles Lamb, male    DOB: Jun 24, 1960  Age: 64 y.o. MRN: [id number]  Chief Complaint  Patient presents with   Annual Exam    CPE and lab results    Here for CPE, lab review and medication refills.Recently underwent right shoulder arthroscopic surgery. Labs reviewed and notable for normalization of transaminases, A1c in the prediabetic range, normal cbc and psa.    No other concerns at this time.   Past Medical History:  Diagnosis Date   Arthritis    Hemorrhoids    Hyperlipidemia    Hypertension    Renal disorder     Past Surgical History:  Procedure Laterality Date   COLONOSCOPY WITH PROPOFOL  N/A 02/07/2019   Procedure: COLONOSCOPY WITH PROPOFOL ;  Surgeon: Therisa Bi, MD;  Location: [facility] ENDOSCOPY;  Service: Gastroenterology;  Laterality: N/A;   EYE SURGERY Left    FLEXIBLE SIGMOIDOSCOPY N/A 04/30/2020   Procedure: FLEXIBLE SIGMOIDOSCOPY;  Surgeon: Therisa Bi, MD;  Location: [facility] ENDOSCOPY;  Service: Gastroenterology;  Laterality: N/A;   TOTAL SHOULDER ARTHROPLASTY Left 05/25/2018   Procedure: LEFT SHOULDER HEMIARTHROPLASTY, BICEPS TENODESIS;  Surgeon: Leora Lynwood SAUNDERS, MD;  Location: [facility] ORS;  Service: Orthopedics;  Laterality: Left;   TOTAL SHOULDER REPLACEMENT Left     Social History   Socioeconomic History   Marital status: Single    Spouse name: Not on file   Number of children: 0   Years of education: Not on file   Highest education level: High school graduate  Occupational History   Occupation: disability    Comment: Former HOSPITAL DOCTOR  Tobacco Use   Smoking status: Every Day    Current packs/day: 0.25    Average packs/day: 0.3 packs/day for 45.0 years (11.3 ttl pk-yrs)    Types: Cigarettes   Smokeless tobacco: Never  Vaping Use   Vaping status: Never Used  Substance and Sexual Activity   Alcohol use: Yes    Alcohol/week: 7.0 standard drinks of alcohol    Types: 7 Cans of beer per week     Comment: 1 40oz a day   Drug use: Never   Sexual activity: Yes  Other Topics Concern   Not on file  Social History Narrative   ** Merged History Encounter **       Social Drivers of Health   Tobacco Use: High Risk (04/14/2024)   Patient History    Smoking Tobacco Use: Every Day    Smokeless Tobacco Use: Never    Passive Exposure: Not on file  Financial Resource Strain: Medium Risk (12/18/2021)   Overall Financial Resource Strain (CARDIA)    Difficulty of Paying Living Expenses: Somewhat hard  Food Insecurity: No Food Insecurity (12/18/2021)   Hunger Vital Sign    Worried About Running Out of Food in the Last Year: Never true    Ran Out of Food in the Last Year: Never true  Transportation Needs: No Transportation Needs (12/18/2021)   PRAPARE - Administrator, Civil Service (Medical): No    Lack of Transportation (Non-Medical): No  Physical Activity: Not on file  Stress: Not on file  Social Connections: Not on file  Intimate Partner Violence: Not on file  Depression (PHQ2-9): Low Risk (03/31/2022)   Depression (PHQ2-9)    PHQ-2 Score: 0  Alcohol Screen: Not on file  Housing: Unknown (02/22/2024)   Received from [facility]   Epic    Unable to Pay for Housing  in the Last Year: Not on file    Number of Times Moved in the Last Year: Not on file    At any time in the past 12 months, were you homeless or living in a shelter (including now)?: No  Utilities: Not At Risk (12/18/2021)   [facility] Utilities    Threatened with loss of utilities: No  Health Literacy: Not on file    Family History  Problem Relation Age of Onset   Kidney disease Mother    Stomach cancer Sister    Kidney disease Brother    Kidney disease Brother    Kidney disease Brother     Allergies[1]  Show/hide medication list[2]  Review of Systems  Constitutional: Negative.  Negative for weight loss (gained 5 lbs).  HENT: Negative.    Eyes:        Left eye visual loss  Respiratory:  Negative.    Cardiovascular: Negative.   Gastrointestinal: Negative.   Genitourinary: Negative.   Musculoskeletal:  Positive for joint pain (right shoulder).  Skin: Negative.   Neurological: Negative.   Endo/Heme/Allergies: Negative.   Psychiatric/Behavioral:  The patient does not have insomnia.        Objective:   BP 127/79   Pulse 70   Temp 98.1 F (36.7 C)   Ht 6' 1 (1.854 m)   Wt 189 lb 9.6 oz (86 kg)   SpO2 95%   BMI 25.01 kg/m   Vitals:   04/14/24 1407  BP: 127/79  Pulse: 70  Temp: 98.1 F (36.7 C)  Height: 6' 1 (1.854 m)  Weight: 189 lb 9.6 oz (86 kg)  SpO2: 95%  BMI (Calculated): 25.02    Physical Exam Vitals reviewed.  Constitutional:      Appearance: Normal appearance.  HENT:     Head: Normocephalic.     Left Ear: There is no impacted cerumen.     Nose: Nose normal.     Mouth/Throat:     Mouth: Mucous membranes are moist.     Pharynx: No posterior oropharyngeal erythema.  Eyes:     General: Lids are normal. Visual field deficit present.     Extraocular Movements: Extraocular movements intact.     Pupils: Pupils are equal, round, and reactive to light.     Comments: Opaque left cornea  Cardiovascular:     Rate and Rhythm: Regular rhythm.     Chest Wall: PMI is not displaced.     Pulses: Normal pulses.     Heart sounds: Normal heart sounds. No murmur heard. Pulmonary:     Effort: Pulmonary effort is normal.     Breath sounds: Normal air entry. No rhonchi or rales.  Abdominal:     General: Abdomen is flat. Bowel sounds are normal. There is no distension.     Palpations: Abdomen is soft. There is no hepatomegaly, splenomegaly or mass.     Tenderness: There is no abdominal tenderness.  Musculoskeletal:        General: Normal range of motion.     Cervical back: Normal range of motion and neck supple. No tenderness. Muscular tenderness (upper trap and right paracervical) present.     Right lower leg: No edema.     Left lower leg: No edema.   Skin:    General: Skin is warm and dry.     Comments: Dressed right shoulder incision  Neurological:     General: No focal deficit present.     Mental Status: He is alert and oriented  to person, place, and time.     Cranial Nerves: No cranial nerve deficit.     Motor: No weakness.     Deep Tendon Reflexes: Reflexes abnormal.     Comments: Hyporeflexic globally  Psychiatric:        Mood and Affect: Mood normal.        Behavior: Behavior normal.      No results found for any visits on 04/14/24.  Recent Results (from the past 2160 hours)  Lipid panel     Status: None   Collection Time: 02/23/24 10:18 AM  Result Value Ref Range   Cholesterol, Total 173 100 - 199 mg/dL   Triglycerides 93 0 - 149 mg/dL   HDL 74 >60 mg/dL   VLDL Cholesterol Cal 17 5 - 40 mg/dL   LDL Chol Calc (NIH) 82 0 - 99 mg/dL   Chol/HDL Ratio 2.3 0.0 - 5.0 ratio    Comment:                                   T. Chol/HDL Ratio                                             Men  Women                               1/2 Avg.Risk  3.4    3.3                                   Avg.Risk  5.0    4.4                                2X Avg.Risk  9.6    7.1                                3X Avg.Risk 23.4   11.0   AST     Status: Abnormal   Collection Time: 02/23/24 10:18 AM  Result Value Ref Range   AST 46 (H) 0 - 40 IU/L  ALT     Status: Abnormal   Collection Time: 02/23/24 10:18 AM  Result Value Ref Range   ALT 55 (H) 0 - 44 IU/L  PSA     Status: None   Collection Time: 04/12/24  9:19 AM  Result Value Ref Range   Prostate Specific Ag, Serum 1.8 0.0 - 4.0 ng/mL    Comment: Roche ECLIA methodology. According to the American Urological Association, Serum PSA should decrease and remain at undetectable levels after radical prostatectomy. The AUA defines biochemical recurrence as an initial PSA value 0.2 ng/mL or greater followed by a subsequent confirmatory PSA value 0.2 ng/mL or greater. Values obtained with  different assay methods or kits cannot be used interchangeably. Results cannot be interpreted as absolute evidence of the presence or absence of malignant disease.   CBC With Diff/Platelet     Status: None   Collection Time: 04/12/24  9:19 AM  Result Value Ref Range   WBC 5.4 3.4 - 10.8  x10E3/uL   RBC 4.23 4.14 - 5.80 x10E6/uL   Hemoglobin 13.1 13.0 - 17.7 g/dL   Hematocrit 60.8 62.4 - 51.0 %   MCV 92 79 - 97 fL   MCH 31.0 26.6 - 33.0 pg   MCHC 33.5 31.5 - 35.7 g/dL   RDW 87.4 88.3 - 84.5 %   Platelets 279 150 - 450 x10E3/uL   Neutrophils 51 Not Estab. %   Lymphs 32 Not Estab. %   Monocytes 12 Not Estab. %   Eos 3 Not Estab. %   Basos 1 Not Estab. %   Neutrophils Absolute 2.8 1.4 - 7.0 x10E3/uL   Lymphocytes Absolute 1.7 0.7 - 3.1 x10E3/uL   Monocytes Absolute 0.6 0.1 - 0.9 x10E3/uL   EOS (ABSOLUTE) 0.2 0.0 - 0.4 x10E3/uL   Basophils Absolute 0.0 0.0 - 0.2 x10E3/uL   Immature Granulocytes 0 Not Estab. %   Immature Grans (Abs) 0.0 0.0 - 0.1 x10E3/uL  Comprehensive metabolic panel with GFR     Status: None   Collection Time: 04/12/24  9:19 AM  Result Value Ref Range   Glucose 98 70 - 99 mg/dL   BUN 11 8 - 27 mg/dL   Creatinine, Ser 9.05 0.76 - 1.27 mg/dL   eGFR 92 >40 fO/fpw/8.26   BUN/Creatinine Ratio 12 10 - 24   Sodium 138 134 - 144 mmol/L   Potassium 4.4 3.5 - 5.2 mmol/L   Chloride 101 96 - 106 mmol/L   CO2 21 20 - 29 mmol/L   Calcium  9.6 8.6 - 10.2 mg/dL   Total Protein 6.7 6.0 - 8.5 g/dL   Albumin 4.4 3.9 - 4.9 g/dL   Globulin, Total 2.3 1.5 - 4.5 g/dL   Bilirubin Total 0.3 0.0 - 1.2 mg/dL   Alkaline Phosphatase 60 47 - 123 IU/L   AST 16 0 - 40 IU/L   ALT 14 0 - 44 IU/L  Hemoglobin A1c     Status: Abnormal   Collection Time: 04/12/24  9:19 AM  Result Value Ref Range   Hgb A1c MFr Bld 5.9 (H) 4.8 - 5.6 %    Comment:          Prediabetes: 5.7 - 6.4          Diabetes: >6.4          Glycemic control for adults with diabetes: <7.0    Est. average glucose Bld gHb  Est-mCnc 123 mg/dL      Assessment & Plan:  Fahed was seen today for annual exam.  Mixed hyperlipidemia -     CK -     Lipid panel -     Comprehensive metabolic panel with GFR  Prediabetes -     Hemoglobin A1c  Primary hypertension Overview: Last Assessment & Plan:  Well-controlled.  Refills were given for lisinopril 5 g once daily.   Encounter for general adult medical examination with abnormal findings   Smoking cessation instruction/counseling given:  counseled patient on the dangers of tobacco use, advised patient to stop smoking, and reviewed strategies to maximize success  Problem List Items Addressed This Visit       Cardiovascular and Mediastinum   Hypertension     Other   Mixed hyperlipidemia - Primary   Relevant Orders   Lipid panel   Comprehensive metabolic panel with GFR   Prediabetes   Relevant Orders   Hemoglobin A1c   Other Visit Diagnoses       Encounter for general adult medical examination with abnormal  findings           Return in 3 months (on 07/13/2024) for fu with labs prior.   Total time spent: 30 minutes. This time includes review of previous notes and results and patient face to face interaction during today'Alfrieda Tarry visit.    Sherrill Cinderella Perry, MD  04/14/2024   This document may have been prepared by [facility] Voice Recognition software and as such may include unintentional dictation errors.     [1] No Known Allergies [2]  Outpatient Medications Prior to Visit  Medication Sig   aspirin  EC 81 MG tablet Take 1 tablet (81 mg total) by mouth daily. Swallow whole.   carvedilol  (COREG ) 3.125 MG tablet Take 1 tablet (3.125 mg total) by mouth 2 (two) times daily.   Cholecalciferol (VITAMIN D3) 250 MCG (10000 UT) CAPS Take 1 capsule by mouth daily.   docusate sodium  (COLACE) 100 MG capsule Take 1 capsule (100 mg total) by mouth daily as needed.   ezetimibe  (ZETIA ) 10 MG tablet Take 1 tablet (10 mg total) by mouth daily.   oxyCODONE  (OXY  IR/ROXICODONE ) 5 MG immediate release tablet Take 5 mg by mouth every 4 (four) hours as needed.   rosuvastatin  (CRESTOR ) 40 MG tablet Take 1 tablet by mouth once daily   No facility-administered medications prior to visit.   "

## 2024-07-18 ENCOUNTER — Ambulatory Visit: Admitting: Internal Medicine

## 2024-07-25 ENCOUNTER — Encounter (INDEPENDENT_AMBULATORY_CARE_PROVIDER_SITE_OTHER)

## 2024-07-25 ENCOUNTER — Ambulatory Visit (INDEPENDENT_AMBULATORY_CARE_PROVIDER_SITE_OTHER): Admitting: Vascular Surgery
